# Patient Record
Sex: Female | Born: 1935 | Race: White | Hispanic: No | Marital: Married | Smoking: Never smoker
Health system: Southern US, Community
[De-identification: ages and names within clinical notes are randomized; demographics above are authoritative.]

## PROBLEM LIST (undated history)

## (undated) DIAGNOSIS — E041 Nontoxic single thyroid nodule: Secondary | ICD-10-CM

## (undated) DIAGNOSIS — M4800 Spinal stenosis, site unspecified: Secondary | ICD-10-CM

## (undated) DIAGNOSIS — E782 Mixed hyperlipidemia: Secondary | ICD-10-CM

## (undated) DIAGNOSIS — E049 Nontoxic goiter, unspecified: Secondary | ICD-10-CM

## (undated) DIAGNOSIS — I442 Atrioventricular block, complete: Secondary | ICD-10-CM

## (undated) DIAGNOSIS — I10 Essential (primary) hypertension: Secondary | ICD-10-CM

## (undated) HISTORY — DX: Mixed hyperlipidemia: E78.2

## (undated) HISTORY — DX: Nontoxic goiter, unspecified: E04.9

## (undated) HISTORY — PX: TONSILLECTOMY: SUR1361

## (undated) HISTORY — PX: DILATION AND CURETTAGE OF UTERUS: SHX78

## (undated) HISTORY — DX: Spinal stenosis, site unspecified: M48.00

## (undated) HISTORY — PX: BREAST BIOPSY: SHX20

## (undated) HISTORY — DX: Essential (primary) hypertension: I10

## (undated) SURGERY — EGD (ESOPHAGOGASTRODUODENOSCOPY)
Anesthesia: Moderate Sedation

---

## 1984-03-24 HISTORY — PX: KNEE ARTHROSCOPY: SHX127

## 1997-05-25 ENCOUNTER — Other Ambulatory Visit: Admission: RE | Admit: 1997-05-25 | Discharge: 1997-05-25 | Payer: Self-pay | Admitting: Obstetrics and Gynecology

## 1997-07-07 ENCOUNTER — Ambulatory Visit (HOSPITAL_BASED_OUTPATIENT_CLINIC_OR_DEPARTMENT_OTHER): Admission: RE | Admit: 1997-07-07 | Discharge: 1997-07-07 | Payer: Self-pay | Admitting: *Deleted

## 1999-03-25 HISTORY — PX: CATARACT EXTRACTION W/ INTRAOCULAR LENS  IMPLANT, BILATERAL: SHX1307

## 2002-05-12 ENCOUNTER — Ambulatory Visit (HOSPITAL_COMMUNITY): Admission: RE | Admit: 2002-05-12 | Discharge: 2002-05-12 | Payer: Self-pay | Admitting: Pulmonary Disease

## 2006-11-23 HISTORY — PX: BIOPSY THYROID: PRO38

## 2006-11-26 ENCOUNTER — Ambulatory Visit (HOSPITAL_COMMUNITY): Admission: RE | Admit: 2006-11-26 | Discharge: 2006-11-26 | Payer: Self-pay | Admitting: Pulmonary Disease

## 2006-12-08 ENCOUNTER — Ambulatory Visit (HOSPITAL_COMMUNITY): Admission: RE | Admit: 2006-12-08 | Discharge: 2006-12-08 | Payer: Self-pay | Admitting: Pulmonary Disease

## 2006-12-08 ENCOUNTER — Encounter (INDEPENDENT_AMBULATORY_CARE_PROVIDER_SITE_OTHER): Payer: Self-pay | Admitting: Diagnostic Radiology

## 2008-01-21 ENCOUNTER — Ambulatory Visit (HOSPITAL_COMMUNITY): Admission: RE | Admit: 2008-01-21 | Discharge: 2008-01-21 | Payer: Self-pay | Admitting: Pulmonary Disease

## 2008-01-31 ENCOUNTER — Ambulatory Visit (HOSPITAL_COMMUNITY): Admission: RE | Admit: 2008-01-31 | Discharge: 2008-01-31 | Payer: Self-pay | Admitting: Pulmonary Disease

## 2010-08-06 NOTE — H&P (Signed)
NAMEJACLYN, ANDY               ACCOUNT NO.:  [id number]   MEDICAL RECORD NO.:  [id number]          PATIENT TYPE:  OUT   LOCATION:  RAD                           FACILITY:  [facility]   PHYSICIAN:  Edward L. Juanetta Gosling, M.D.DATE OF BIRTH:  June 28, 1925   DATE OF ADMISSION:  11/26/2006  DATE OF DISCHARGE:  09/04/2008LH                              HISTORY & PHYSICAL   The history of physical is for a needle biopsy of the thyroid.  Ms.  Laitinen is a 77 year old who underwent [facility] screening which showed  that she had high questionable problems with her the carotid artery.  I  then had her do a carotid study as followup, and on that study her  carotids looked okay but she was found to have thyroid nodules  bilaterally.  Ultrasound of the thyroid showed that she had bilateral  nodules, and she is being brought in for a biopsy.  Otherwise, she has  been pretty healthy.  She has a history of hypertension and of  hyperlipidemia.  She is currently taking Cardizem CD 240.  She also  takes HCTZ 12.5 mg daily.   PAST MEDICAL HISTORY:  Otherwise is positive for breast biopsy.   FAMILY HISTORY:  Her father died in his 76s of a stroke, her mother in  her 50s of breast cancer.   SOCIAL HISTORY:  She lives at home.  She is a nonsmoker.   PHYSICAL EXAMINATION:  GENERAL:  Shows a well-developed, well-nourished  female who is in no acute distress.  VITAL SIGNS:  Blood pressure 120/64, pulse 56, respirations 8.  NECK:  She does have this probably a small nodule on her thyroid which I  do not feel prominently.  CHEST:  Clear.  HEART:  Regular.  ABDOMEN: Soft.  EXTREMITIES: No edema.   ASSESSMENT:  She has abnormal thyroid sonogram.   PLAN:  She is being set up for thyroid biopsy.      Edward L. Juanetta Gosling, M.D.  Electronically Signed     ELH/MEDQ  D:  12/03/2006  T:  12/04/2006  Job:  [id number]

## 2010-09-22 HISTORY — PX: TOTAL KNEE ARTHROPLASTY: SHX125

## 2010-10-10 ENCOUNTER — Other Ambulatory Visit: Payer: Self-pay | Admitting: Orthopedic Surgery

## 2010-10-10 ENCOUNTER — Other Ambulatory Visit (HOSPITAL_COMMUNITY): Payer: Self-pay | Admitting: Orthopedic Surgery

## 2010-10-10 ENCOUNTER — Ambulatory Visit (HOSPITAL_COMMUNITY)
Admission: RE | Admit: 2010-10-10 | Discharge: 2010-10-10 | Disposition: A | Discharge status: Home / Self Care | Payer: Medicare Other | Source: Ambulatory Visit | Attending: Orthopedic Surgery | Admitting: Orthopedic Surgery

## 2010-10-10 ENCOUNTER — Encounter (HOSPITAL_COMMUNITY): Payer: Medicare Other

## 2010-10-10 DIAGNOSIS — M1710 Unilateral primary osteoarthritis, unspecified knee: Secondary | ICD-10-CM | POA: Insufficient documentation

## 2010-10-10 DIAGNOSIS — Z01812 Encounter for preprocedural laboratory examination: Secondary | ICD-10-CM | POA: Insufficient documentation

## 2010-10-10 DIAGNOSIS — Z01810 Encounter for preprocedural cardiovascular examination: Secondary | ICD-10-CM | POA: Insufficient documentation

## 2010-10-10 DIAGNOSIS — R9431 Abnormal electrocardiogram [ECG] [EKG]: Secondary | ICD-10-CM | POA: Insufficient documentation

## 2010-10-10 DIAGNOSIS — R059 Cough, unspecified: Secondary | ICD-10-CM | POA: Insufficient documentation

## 2010-10-10 DIAGNOSIS — R05 Cough: Secondary | ICD-10-CM | POA: Insufficient documentation

## 2010-10-10 DIAGNOSIS — Z01818 Encounter for other preprocedural examination: Secondary | ICD-10-CM

## 2010-10-10 LAB — URINALYSIS, ROUTINE W REFLEX MICROSCOPIC
Glucose, UA: NEGATIVE mg/dL
Leukocytes, UA: NEGATIVE
Protein, ur: NEGATIVE mg/dL
Specific Gravity, Urine: 1.012 (ref 1.005–1.030)
Urobilinogen, UA: 0.2 mg/dL (ref 0.0–1.0)

## 2010-10-10 LAB — DIFFERENTIAL
Basophils Relative: 0 % (ref 0–1)
Eosinophils Absolute: 0.2 10*3/uL (ref 0.0–0.7)
Lymphs Abs: 1.2 10*3/uL (ref 0.7–4.0)
Neutro Abs: 5.1 10*3/uL (ref 1.7–7.7)
Neutrophils Relative %: 73 % (ref 43–77)

## 2010-10-10 LAB — CBC
MCH: 30.3 pg (ref 26.0–34.0)
Platelets: 252 10*3/uL (ref 150–400)
RBC: 4.16 MIL/uL (ref 3.87–5.11)
WBC: 7 10*3/uL (ref 4.0–10.5)

## 2010-10-10 LAB — COMPREHENSIVE METABOLIC PANEL
ALT: 17 U/L (ref 0–35)
Alkaline Phosphatase: 63 U/L (ref 39–117)
CO2: 30 mEq/L (ref 19–32)
Calcium: 10.7 mg/dL — ABNORMAL HIGH (ref 8.4–10.5)
GFR calc Af Amer: >60 mL/min (ref >60)
GFR calc non Af Amer: >60 mL/min (ref >60)
Glucose, Bld: 116 mg/dL — ABNORMAL HIGH (ref 70–99)
Sodium: 139 mEq/L (ref 135–145)

## 2010-10-10 LAB — SURGICAL PCR SCREEN
MRSA, PCR: NEGATIVE
Staphylococcus aureus: NEGATIVE

## 2010-10-10 LAB — PROTIME-INR: Prothrombin Time: 13.5 seconds (ref 11.6–15.2)

## 2010-10-17 ENCOUNTER — Inpatient Hospital Stay (HOSPITAL_COMMUNITY)
Admission: RE | Admit: 2010-10-17 | Discharge: 2010-10-20 | DRG: 470 | Disposition: A | Discharge status: Home Health | Payer: Medicare Other | Source: Ambulatory Visit | Attending: Orthopedic Surgery | Admitting: Orthopedic Surgery

## 2010-10-17 DIAGNOSIS — D62 Acute posthemorrhagic anemia: Secondary | ICD-10-CM | POA: Diagnosis not present

## 2010-10-17 DIAGNOSIS — R110 Nausea: Secondary | ICD-10-CM | POA: Diagnosis not present

## 2010-10-17 DIAGNOSIS — I498 Other specified cardiac arrhythmias: Secondary | ICD-10-CM | POA: Diagnosis not present

## 2010-10-17 DIAGNOSIS — M24569 Contracture, unspecified knee: Secondary | ICD-10-CM | POA: Diagnosis present

## 2010-10-17 DIAGNOSIS — Z01812 Encounter for preprocedural laboratory examination: Secondary | ICD-10-CM

## 2010-10-17 DIAGNOSIS — M1710 Unilateral primary osteoarthritis, unspecified knee: Principal | ICD-10-CM | POA: Diagnosis present

## 2010-10-17 DIAGNOSIS — I10 Essential (primary) hypertension: Secondary | ICD-10-CM | POA: Diagnosis present

## 2010-10-17 LAB — TYPE AND SCREEN: Antibody Screen: NEGATIVE

## 2010-10-18 LAB — CBC
HCT: 26.6 % — ABNORMAL LOW (ref 36.0–46.0)
Hemoglobin: 9.7 g/dL — ABNORMAL LOW (ref 12.0–15.0)
MCH: 32 pg (ref 26.0–34.0)
MCHC: 36.5 g/dL — ABNORMAL HIGH (ref 30.0–36.0)
MCV: 87.8 fL (ref 78.0–100.0)

## 2010-10-18 LAB — BASIC METABOLIC PANEL
Calcium: 8.8 mg/dL (ref 8.4–10.5)
GFR calc non Af Amer: >60 mL/min (ref >60)
Sodium: 133 mEq/L — ABNORMAL LOW (ref 135–145)

## 2010-10-19 LAB — BASIC METABOLIC PANEL
CO2: 29 mEq/L (ref 19–32)
Chloride: 101 mEq/L (ref 96–112)
Sodium: 135 mEq/L (ref 135–145)

## 2010-10-19 LAB — CBC
Platelets: 211 10*3/uL (ref 150–400)
RBC: 2.82 MIL/uL — ABNORMAL LOW (ref 3.87–5.11)
WBC: 8.8 10*3/uL (ref 4.0–10.5)

## 2010-10-20 LAB — CBC
HCT: 22.2 % — ABNORMAL LOW (ref 36.0–46.0)
MCV: 86.7 fL (ref 78.0–100.0)
Platelets: 187 10*3/uL (ref 150–400)
RBC: 2.56 MIL/uL — ABNORMAL LOW (ref 3.87–5.11)
WBC: 9 10*3/uL (ref 4.0–10.5)

## 2010-10-21 ENCOUNTER — Other Ambulatory Visit (HOSPITAL_COMMUNITY): Payer: Self-pay | Admitting: Radiation Oncology

## 2010-10-31 NOTE — Op Note (Signed)
Laura Martin, Laura Martin               ACCOUNT NO.:  [id number]  MEDICAL RECORD NO.:  [id number]  LOCATION:  0003                         FACILITY:  [facility]  PHYSICIAN:  Essynce Munsch L. Rendall, M.D.  DATE OF BIRTH:  1925-09-17  DATE OF PROCEDURE:  10/17/2010 DATE OF DISCHARGE:                              OPERATIVE REPORT   PREOPERATIVE DIAGNOSIS:  Osteoarthritis, left knee with contractures.  SURGICAL PROCEDURES:  Left LCS total knee arthroplasty with computer navigation assistance.  POSTOPERATIVE DIAGNOSIS:  Osteoarthritis, left knee with contractures.  SURGEON:  Radonna Bracher L. Rendall, M.D.  ASSISTANT:  Legrand Pitts. Duffy, P.A.  ANESTHESIA:  Spinal with femoral nerve block.  PATHOLOGY:  The patient has medial compartment bone against bone and advanced arthritis throughout the knee.  She has a fixed flexion deformity with 15 degrees fixed flexion contracture and 7 degrees of varus.  She has reached the point where her knee hurts virtually all the time and hinders ambulation.  PROCEDURE IN DETAIL:  Under spinal anesthesia with femoral nerve block, the left leg was prepared with DuraPrep and draped as a sterile field. The sterile tourniquet was applied proximally.  Legs wrapped out with the Esmarch and the tourniquet was used at 350 mm.  Standard time-out was done, antibiotics were given, patient's knee was opened with midline incision, the patella was everted, femur was sized at a standard plus. The spurs are debrided, the knee is further debrided, and preparation for computer mapping.  Schanz pins were then placed in the superior medial tibia and the distal femur.  The arrays were set up, the femoral head was identified, malleoli are identified.  Proximal tibia and distal femur are mapped to within 0.5 mm of reproducible accuracy.  The proximal tibial cut was then made within 1 degree of anatomic alignment and preparing for the balancer.  It is necessary to do release of the superficial MCL,  going to the posterior capsule, taking down large spurs along the medial side of the tibia.  Once this was done, the balancer was got the longitudinal alignment within 1 degree and the flexion gap was then measured.  The first femoral cuts were then made, anterior and posterior flare of the distal femur within 1 degree of rotational alignment, flexion gap 10 mm.  Distal femoral cut was then made and extension gap within 10 mm.  The posterior recesses, menisci, and cruciates were then debrided.  Spurs taken off the back of the femoral condyles.  Recessing guide is then used.  Attention was then turned to the tibia, it is sized to the same #3 center peg hole with keel was inserted.  Trial of the 10 bearing and standard plus femur reveal excellent fit, alignment, and stability.  The patella was osteotomized and the 3-hole patella inserted.  With these components, the longitudinal alignment was within 1 degree, knee extension was within 2 degrees.  The knee was then prepared with pulse irrigation.  Permanent components obtained and cemented in place with SmartSet HV bone cement. Once the cement has hardened, the tourniquet was let down at 62 minutes. Multiple small vessels were cauterized.  A medium Hemovac drain was inserted.  The knee was then closed  in layers with #1 Ticron, #1 Vicryl, 2-0 Vicryl, and skin clips.  Operative time approximately an hour and 20 minutes.  The patient tolerated the procedure well and returned to recovery in good condition.     Dimitris Shanahan L. Priscille Kluver, M.D.     Renato Gails  D:  10/17/2010  T:  10/17/2010  Job:  [id number]  Electronically Signed by Erasmo Leventhal M.D. on 10/31/2010 03:05:33 PM

## 2010-11-07 NOTE — Discharge Summary (Signed)
Laura Martin, OATS               ACCOUNT NO.:  [id number]  MEDICAL RECORD NO.:  [id number]  LOCATION:  1613                         FACILITY:  [facility]  PHYSICIAN:  Elektra Wartman L. Rendall, M.D.  DATE OF BIRTH:  [birth year]  DATE OF ADMISSION:  10/17/2010 DATE OF DISCHARGE:  10/20/2010                              DISCHARGE SUMMARY   ADMISSION DIAGNOSES: 1. End-stage osteoarthritis, bilateral knees, left worse than right. 2. Cyst on her left side of her back. 3. Hypertension. 4. Hypercholesterolemia. 5. Degenerative disk disease, lumbar spine.  DISCHARGE DIAGNOSES: 1. End-stage osteoarthritis, bilateral knees, now status post left     total knee arthroplasty. 2. Acute blood loss anemia secondary to surgery. 3. Bradycardia, now resolved. 4. Cyst on the left side of her back. 5. Hypertension. 6. Hypercholesterolemia. 7. Degenerative disk disease, lumbar spine.  SURGICAL PROCEDURES:  On October 17, 2010, Ms. [place] underwent a left total knee arthroplasty with computer navigation by Dr. Jonny Ruiz L. Rendall, assisted by Arnoldo Morale, PA-C.  She had a DePuy LCS complete metal back patella placed, cemented, size standard plus, with primary femoral component, cemented, size standard plus, left.  A tibial tray rotating platform MBT keel, size 3, cemented, with an LCS complete RP insert rotating platform, 10 mm thickness, standard plus.  COMPLICATIONS:  None.  CONSULTS: 1. Physical therapy consult, July 27th. 2. Occupational therapy consult, October 19, 2010.  HISTORY OF PRESENT ILLNESS:  This 77 year old white female patient presented to Dr. Priscille Kluver with a 30 plus year history of gradual-onset progressive left knee pain.  She has a history of a knee scope in 1986 and no other injury.  The left knee pain is now intermittent over the anteromedial knee without radiation.  It increases with bending and stairs and decreases with Aleve.  She has no mechanical symptoms and some Aleve does help her  pain, but she has failed conservative treatment and x-rays show end-stage arthritic changes.  Because of this, she is presenting for a left knee replacement.  HOSPITAL COURSE:  She tolerated her surgical procedure well, however, she did have some bradycardia.  That was monitored closely.  On postop day #1, she was afebrile, pulse was low at 42-62.  Hemoglobin was 9.7, hematocrit 26.6.  Her Hemovac was discontinued from her knees.  She was tolerating the CPM well.  Her oxygen was weaned that day.  Her meds were adjusted.  An EKG was obtained to rule out any cardiac abnormality and that was normal.  She was started on therapy per protocol.  On postop day #2, pain was improving.  She did have some nausea, which was treated effectively with Zofran.  She remained afebrile.  Hemoglobin 8.8, hematocrit 25.  Hemoglobin and hematocrit were monitored and she was continued on therapy.  On July 29th, she remained afebrile, but hemoglobin had dropped to 8.1 with hematocrit of 22.2, she was asymptomatic.  She did well enough with therapy that was felt she was ready for discharge home and was discharged home later that day.  DISCHARGE INSTRUCTIONS:  DIET:  She is to resume her regular prehospitalization diet.  MEDICATIONS:  Please see the patient med discharge instruction sheet for complete documentation of  her medications.  We did place her on Dilaudid, Robaxin, and Xarelto to have at home and we did have her stop taking her Aleve, which she was on preoperatively.  Again, complete documentation of her medications and dosages are on the patient med discharge instruction sheet.  ACTIVITY:  She can be out of bed, weightbearing as tolerated on the left leg with use of the walker.  No lifting or driving for 6 weeks.  Please see the white total joint discharge sheet for further activity instructions.  WOUND CARE:  Please see the white total joint discharge sheet for further wound care  instructions.  FOLLOWUP:  She is to follow up with Dr. Priscille Kluver in our office on Monday, August 6th, and needs to call [phone] for that appointment.  She has arranged for home health PT per [facility] home [facility].  LABORATORY DATA:  Hemoglobin/hematocrit ranged from 9.7 and 26.6 on the 27th to 8.1 and 22.2 on the 29th.  Platelets went from 231 on the 27th to 187 on the 29th.  Sodium went from 133 on the 27th to 135 on the 28th.  Glucose went from 170 on the 27th to 120 on the 28th.  All other laboratory studies were within normal limits.     Legrand Pitts Duffy, P.A.   ______________________________ Carlisle Beers. Priscille Kluver, M.D.    KED/MEDQ  D:  10/31/2010  T:  11/01/2010  Job:  [id number]  Electronically Signed by Otilio Jefferson. on 11/01/2010 08:17:36 AM Electronically Signed by Erasmo Leventhal M.D. on 11/07/2010 10:25:41 AM

## 2010-11-19 ENCOUNTER — Ambulatory Visit (HOSPITAL_COMMUNITY)
Admission: RE | Admit: 2010-11-19 | Discharge: 2010-11-19 | Disposition: A | Discharge status: Home / Self Care | Payer: Medicare Other | Source: Ambulatory Visit | Attending: Orthopedic Surgery | Admitting: Orthopedic Surgery

## 2010-11-19 DIAGNOSIS — M25562 Pain in left knee: Secondary | ICD-10-CM | POA: Insufficient documentation

## 2010-11-19 DIAGNOSIS — M25669 Stiffness of unspecified knee, not elsewhere classified: Secondary | ICD-10-CM | POA: Insufficient documentation

## 2010-11-19 DIAGNOSIS — IMO0001 Reserved for inherently not codable concepts without codable children: Secondary | ICD-10-CM | POA: Insufficient documentation

## 2010-11-19 DIAGNOSIS — R262 Difficulty in walking, not elsewhere classified: Secondary | ICD-10-CM | POA: Insufficient documentation

## 2010-11-19 DIAGNOSIS — M25569 Pain in unspecified knee: Secondary | ICD-10-CM | POA: Insufficient documentation

## 2010-11-19 DIAGNOSIS — M6281 Muscle weakness (generalized): Secondary | ICD-10-CM | POA: Insufficient documentation

## 2010-11-19 DIAGNOSIS — R29898 Other symptoms and signs involving the musculoskeletal system: Secondary | ICD-10-CM | POA: Insufficient documentation

## 2010-11-19 NOTE — Patient Instructions (Addendum)
HEP

## 2010-11-19 NOTE — Progress Notes (Signed)
Physical Therapy Evaluation  Patient Details  Name: Laura Martin MRN: [id number] Date of Birth: [birth year]  Today's Date: 11/19/2010 Time: 6962-9528 Time Calculation (min): 55 min Visit#: 1 of12 Re-eval: 12/17/10  Past Medical History: No past medical history on file. Past Surgical History: No past surgical history on file.  Subjective Symptoms/Limitations Symptoms: Pt states that she had on the July 26th.  She was in the hospital for three days.  Her home heralth therapy ended August 24th.  The patient is currently walkng with a cane.  The patient states that right before the operation she started wallking with a cane do to pain but she hopes that she will not have to use the cane  as time progresses.  The pateint states tht she is having the hardest time sleeping at night.  The patient states that it takes a couple of hours to get to sleep and then she will wake up several times a night .  The patient is now being referred to out-patient therapy to improve her functional  ability and quality of life. How long can you sit comfortably?: Able to sti for several hours but will become very fidgety after thirty minute. How long can you stand comfortably?: Standing is difficult.  She is able to stand for less than ten minutes. How long can you walk comfortably?: Pt has walked with her cane for fifteen minutes without stopping Pain Assessment Currently in Pain?: Yes Pain Score: 0-No pain (greatest pain has been a 3) Pain Location: Knee Pain Orientation: Left Pain Type: Surgical pain Multiple Pain Sites: No  Precautions/Restrictions   none  Prior Functioning  Home Living Type of Home: House Lives With: Spouse Receives Help From: Family Home Layout: Able to live on main level with bedroom/bathroom Home Access: Stairs to enter Entrance Stairs-Rails: Right Entrance Stairs-Number of Steps: 5 Bathroom Shower/Tub: Tub/shower unit (has a seat.) Firefighter: Standard Home Adaptive  Equipment: Tub transfer bench Prior Function Level of Independence: Independent with basic ADLs Driving: Yes Able to Take Stairs Reciprically: No Vocation: Retired Leisure: Hobbies-yes (Comment) Comments: enjoys dancing  Cognition Cognition Overall Cognitive Status: Appears within functional limits for tasks assessed Arousal/Alertness: Awake/alert Orientation Level: Oriented X4  Sensation/Coordination/Flexibility Sensation Light Touch:  (Scar is sensitive at inferior aspect.)  Assessment LLE AROM (degrees) Left Knee Extension 0-130: 7  Left Knee Flexion 0-140: 105  LLE PROM (degrees) Left Knee Extension 0-130: 4 Left Knee Flexion 0-140: 112 LLE Strength Left Hip Flexion: 5/5 Left Hip Extension: 3+/5 Left Hip ABduction: 4/5 Left Hip ADduction: 5/5 Left Knee Flexion: 3+/5 Left Knee Extension: 5/5  Mobility (including Balance)  Pt ambulates with a cane.    Exercise/Treatments For stretching and strengthening per doc flow sheet.    Physical Therapy Assessment and Plan PT Assessment and Plan Clinical Impression Statement: Pt with weakened hip abductors, hip extensors and hamstrings as well as decreased ROM affecting ability to walk and comfort of sitting/sleeping.  Pt will benefit from skilled physical therapy to improve I and  quality of life. Rehab Potential: Good PT Frequency: Min 3X/week PT Duration: 4 weeks PT Treatment/Interventions: Therapeutic exercise PT Plan: work on balance, gt without an assistive device and strengthening hip ab,extensors and hamstring mm.  Begin bike, standing ham curl/hip abduction,extension, rockerboard, SLS and terminal knee extesion next treatment.    Goals PT Short Term Goals PT Short Term Goal 1: Pt to be able to sleep 4-5 hours PT Short Term Goal 2: Pt ambulating in the house without  an assistive device. PT Short Term Goal 3: ROM o to 115 to allow more comfort in sitting. PT Long Term Goals PT Long Term Goal 1: Pt to ambulate  outside without an assistve device.- 4wk- PT Long Term Goal 2: Pt to start dancing again.l4 wk Long Term Goal 3: strength wnl-4wk Long Term Goal 4: Pt sleeping throughout the night- 4 wk  Problem List Patient Active Problem List  Diagnoses  . Stiffness of joint, not elsewhere classified, lower leg  . Pain in left knee  . Weakness of left leg    PT - End of Session Activity Tolerance: Patient tolerated treatment well General Behavior During Session: [facility] for tasks performed Cognition: [facility] for tasks performed   RUSSELL,CINDY 11/19/2010, 10:54 AM  Physician Documentation Your signature is required to indicate approval of the treatment plan as stated above.  Please sign and either send electronically or make a copy of this report for your files and return this physician signed original.   Please mark one 1.__approve of plan  2. ___approve of plan with the following conditions.   ______________________________                                                          _____________________ Physician Signature                                                                                                             Date

## 2010-11-21 ENCOUNTER — Ambulatory Visit (HOSPITAL_COMMUNITY)
Admission: RE | Admit: 2010-11-21 | Discharge: 2010-11-21 | Disposition: A | Discharge status: Home / Self Care | Payer: Medicare Other | Source: Ambulatory Visit | Attending: Physical Therapy | Admitting: Physical Therapy

## 2010-11-21 DIAGNOSIS — M25669 Stiffness of unspecified knee, not elsewhere classified: Secondary | ICD-10-CM

## 2010-11-21 DIAGNOSIS — M25562 Pain in left knee: Secondary | ICD-10-CM

## 2010-11-21 DIAGNOSIS — R29898 Other symptoms and signs involving the musculoskeletal system: Secondary | ICD-10-CM

## 2010-11-21 NOTE — Patient Instructions (Signed)
Not to raise LE so high with prone hip ext

## 2010-11-21 NOTE — Progress Notes (Signed)
Physical Therapy Treatment Patient Details  Name: Laura Martin MRN: [id number] Date of Birth: 1925/12/14  Today's Date: 11/21/2010 Time: 5284-1324 Time Calculation (min): 50 min Visit#: 1 of 12 Re-eval: 12/17/10  Subjective: Symptoms/Limitations Symptoms: My bottom muscles are very sore but my knee is feeling good. Pain Assessment Currently in Pain?: Yes Pain Score:   2 Pain Location: Other (Comment) (glut area) Pain Orientation: Left;Right  Precautions/Restrictions     Mobility (including Balance)       Exercise/Treatments  for strengthening and stretching per doc flow sheet.    Physical Therapy Assessment and Plan PT Assessment and Plan Clinical Impression Statement: Pt completed new exercises with good technique. Rehab Potential: Good PT Plan: Begin chair lunging to increase flex, prone contract relax next visit.    Goals  normalize gait; return pt to prior level of functioning.  Problem List Patient Active Problem List  Diagnoses  . Stiffness of joint, not elsewhere classified, lower leg  . Pain in left knee  . Weakness of left leg    PT - End of Session Activity Tolerance: Patient tolerated treatment well General Behavior During Session: [facility] for tasks performed Cognition: [facility] for tasks performed  RUSSELL,CINDY 11/21/2010, 4:10 PM

## 2010-11-27 ENCOUNTER — Ambulatory Visit (HOSPITAL_COMMUNITY)
Admission: RE | Admit: 2010-11-27 | Discharge: 2010-11-27 | Disposition: A | Discharge status: Home / Self Care | Payer: Medicare Other | Source: Ambulatory Visit | Attending: Orthopedic Surgery | Admitting: Orthopedic Surgery

## 2010-11-27 DIAGNOSIS — M6281 Muscle weakness (generalized): Secondary | ICD-10-CM | POA: Insufficient documentation

## 2010-11-27 DIAGNOSIS — M25569 Pain in unspecified knee: Secondary | ICD-10-CM | POA: Insufficient documentation

## 2010-11-27 DIAGNOSIS — R262 Difficulty in walking, not elsewhere classified: Secondary | ICD-10-CM | POA: Insufficient documentation

## 2010-11-27 DIAGNOSIS — M25669 Stiffness of unspecified knee, not elsewhere classified: Secondary | ICD-10-CM | POA: Insufficient documentation

## 2010-11-27 DIAGNOSIS — IMO0001 Reserved for inherently not codable concepts without codable children: Secondary | ICD-10-CM | POA: Insufficient documentation

## 2010-11-27 NOTE — Progress Notes (Signed)
Physical Therapy Treatment Patient Details  Name: CYNDIE WOODBECK MRN: [id number] Date of Birth: [birth year]  Today's Date: 11/27/2010 Time: 4098-1191 Time Calculation (min): 75 min Visit#: 3 of 12 Re-eval: 12/17/10 Charges: Therex x 45' Manual therapy x 8' Ice x 1 unit  Subjective: Symptoms/Limitations Symptoms: No pain right now. I took a pain pill before I came. I felt great after last visit. It only hurts when I do certain things like go to bend it. Pain Assessment Currently in Pain?: No/denies   Exercise/Treatments Stretches   Aerobic Rec bike x 6'@2 .0 Standing Rocker board x 1' L knee flexion x 15 Functional squat x 10 Heel raises x 15 Seated LAQ x 15 Supine Heel slide x 15 TKE x 15 Bridge x 10 Sidelying Hip abd x10 Hip add x 10 Prone  Knee flexion x 10 Hip ext x 10    Physical Therapy Assessment and Plan PT Assessment and Plan Clinical Impression Statement: Pt completes therex without difficulty. Began standing chair lunge and prone contract relax to increase flexion. Began manual techniques to decrease scar tissue at proximal scar. PT Treatment/Interventions: Therapeutic exercise;Other (comment) (Manual therapy; Ice) PT Plan: Continue to progress.Begin lateral step up and forward step up next tx.     Problem List Patient Active Problem List  Diagnoses  . Stiffness of joint, not elsewhere classified, lower leg  . Pain in left knee  . Weakness of left leg    PT - End of Session Activity Tolerance: Patient tolerated treatment well General Behavior During Session: [facility] for tasks performed Cognition: [facility] for tasks performed  Antonieta Iba 11/27/2010, 12:12 PM

## 2010-11-29 ENCOUNTER — Ambulatory Visit (HOSPITAL_COMMUNITY)
Admission: RE | Admit: 2010-11-29 | Discharge: 2010-11-29 | Disposition: A | Discharge status: Home / Self Care | Payer: Medicare Other | Source: Ambulatory Visit | Attending: Pulmonary Disease | Admitting: Pulmonary Disease

## 2010-11-29 DIAGNOSIS — R29898 Other symptoms and signs involving the musculoskeletal system: Secondary | ICD-10-CM

## 2010-11-29 DIAGNOSIS — M25669 Stiffness of unspecified knee, not elsewhere classified: Secondary | ICD-10-CM

## 2010-11-29 DIAGNOSIS — M25562 Pain in left knee: Secondary | ICD-10-CM

## 2010-11-29 NOTE — Progress Notes (Signed)
Physical Therapy Treatment Patient Details  Name: DAWNIELLE CHRISTIANA MRN: [id number] Date of Birth: November 13, 1925  Today's Date: 11/29/2010 Time: 1530-1610 Time Calculation (min): 40 min Visit#: 4 of12 Re-eval: 12/02/10  Subjective: Symptoms/Limitations Symptoms: I have no pain.  Precautions/Restrictions     Mobility (including Balance)     ambulating without assistive device.  Exercise/Treatments Stretches and strengthening per doc flow shet    Physical Therapy Assessment and Plan PT Assessment and Plan Clinical Impression Statement: Pt doing well with ROM and strengthening.  Pt returns to MD on 9/10 in PM Rehab Potential: Good PT Plan: reassess next treatment for MD visit.    Goals  ROM 0-120 I with all activities  Problem List Patient Active Problem List  Diagnoses  . Stiffness of joint, not elsewhere classified, lower leg  . Pain in left knee  . Weakness of left leg    PT - End of Session Activity Tolerance: Patient tolerated treatment well General Behavior During Session: [facility] for tasks performed Cognition: [facility] for tasks performed  RUSSELL,CINDY 11/29/2010, 4:12 PM

## 2010-12-02 ENCOUNTER — Ambulatory Visit (HOSPITAL_COMMUNITY)
Admission: RE | Admit: 2010-12-02 | Discharge: 2010-12-02 | Disposition: A | Discharge status: Home / Self Care | Payer: Medicare Other | Source: Ambulatory Visit | Attending: Physical Therapy | Admitting: Physical Therapy

## 2010-12-02 DIAGNOSIS — M25562 Pain in left knee: Secondary | ICD-10-CM

## 2010-12-02 DIAGNOSIS — R29898 Other symptoms and signs involving the musculoskeletal system: Secondary | ICD-10-CM

## 2010-12-02 DIAGNOSIS — M25669 Stiffness of unspecified knee, not elsewhere classified: Secondary | ICD-10-CM

## 2010-12-02 NOTE — Progress Notes (Signed)
Physical Therapy Evaluation  Patient Details  Name: TAEGAN HAIDER MRN: [id number] Date of Birth: 07/28/1925  Today's Date: 12/02/2010 Time: 7829-5621 Time Calculation (min): 54 min Visit#: 5 of12 Re-eval: 12/19/10  Past Medical History: No past medical history on file. Past Surgical History: No past surgical history on file.  Subjective Symptoms/Limitations Symptoms: Pt states that her pain is minimal.  States she is actually having more difficulty with her left knee than her right. Pain Assessment Currently in Pain?: No/denies  Precautions/Restrictions   none  Prior Functioning   I Cognition   Alert and oriented Sensation/Coordination/Flexibility    Assessment LLE AROM (degrees) Left Knee Extension 0-130: 2  Was lacking 7 Left Knee Flexion 0-140: 115  Was 107 LLE PROM (degrees) Left Knee Extension 0-130: 0 Left Knee Flexion 0-140: 120 LLE Strength Left Hip Flexion: 5/5 Left Hip Extension: 3+/5 was 3+ Left Hip ABduction: 4/5 was 4/5 Left Knee Flexion: 3+/5 was 3+  Mobility (including Balance)   ambulating without her cane in the house.    Exercise/Treatments  for strengthening and stretching.  Physical Therapy Assessment and Plan PT Assessment and Plan Clinical Impression Statement: Pt progressing well still has strength deficits in hip ext and abduction as well as knee flexion. Rehab Potential: Good PT Treatment/Interventions: Gait training;Therapeutic exercise PT Plan: continue for 2 more weeks per prescription.    Goals PT Short Term Goals PT Short Term Goal 1: 3-4 hrs. Goal to sleep 4-5 hours. PT Short Term Goal 2 - Progress: Met PT Short Term Goal 3 - Progress: Met PT Long Term Goals PT Long Term Goal 1 - Progress: Progressing toward goal- goal to have normal strength. Long Term Goal 3 Progress: Progressing toward goal- goal ambulate without cane.  Problem List Patient Active Problem List  Diagnoses  . Stiffness of joint, not elsewhere  classified, lower leg  . Pain in left knee  . Weakness of left leg    PT - End of Session Activity Tolerance: Patient tolerated treatment well General Behavior During Session: [facility] for tasks performed Cognition: [facility] for tasks performed Plan:  Continue to see pt 3x wk for 2 weeks to fulfill original prescription.  RUSSELL,CINDY 12/02/2010, 11:51 AM  Physician Documentation Your signature is required to indicate approval of the treatment plan as stated above.  Please sign and either send electronically or make a copy of this report for your files and return this physician signed original.   Please mark one 1.__approve of plan  2. ___approve of plan with the following conditions.   ______________________________                                                          _____________________ Physician Signature  Date  

## 2010-12-04 ENCOUNTER — Ambulatory Visit (HOSPITAL_COMMUNITY)
Admission: RE | Admit: 2010-12-04 | Discharge: 2010-12-04 | Disposition: A | Discharge status: Home / Self Care | Payer: Medicare Other | Source: Ambulatory Visit | Attending: *Deleted | Admitting: *Deleted

## 2010-12-04 NOTE — Progress Notes (Signed)
Physical Therapy Treatment Patient Details  Name: CHANIECE BARBATO MRN: [id number] Date of Birth: 05/21/1925  Today's Date: 12/04/2010 Time: 4098-1191 Time Calculation (min): 60 min Visit#: 6 of 12 Re-eval: 12/19/10 Charges:  Therex x 40'  Subjective: Symptoms/Limitations Symptoms: Pt states that she had a bad night last and could not get comfortable to go to sleep but this morning she woke up without pain. Pain Assessment Currently in Pain?: No/denies   Exercise/Treatments Aerobic  Rec bike x 6'@2 .0  Standing  Rocker board x 2'  L knee flexion w/ 3# x 15  Functional squat x 10  Heel raises x 20  TKE w/blue band x 15 Hip ext x 15 Hip abd x 15 SLS w/ vectors L 5x10" Supine  SAQ w/3# x 15 Bridge x 15  Sidelying  Hip abd w/3# x15  Hip add w/3# x 10  Prone  Hip ext w/3# x 10 Quad stretch w/ rope 3x 30" Modalities Modalities: Cryotherapy Cryotherapy Number Minutes Cryotherapy: 10 Minutes Cryotherapy Location: Knee (Left) Type of Cryotherapy: Ice pack  Physical Therapy Assessment and Plan PT Assessment and Plan Clinical Impression Statement: Pt continues to display increases in ROM and strength. Began prone quad stretch to increase L knee flexion this tx. PT Treatment/Interventions: Therapeutic exercise;Other (comment) (Ice) PT Plan: Continue to progress strength and ROM per PT POC.     Problem List Patient Active Problem List  Diagnoses  . Stiffness of joint, not elsewhere classified, lower leg  . Pain in left knee  . Weakness of left leg    PT - End of Session Activity Tolerance: Patient tolerated treatment well General Behavior During Session: [facility] for tasks performed Cognition: [facility] for tasks performed  Antonieta Iba 12/04/2010, 11:52 AM

## 2010-12-06 ENCOUNTER — Ambulatory Visit (HOSPITAL_COMMUNITY)
Admission: RE | Admit: 2010-12-06 | Discharge: 2010-12-06 | Disposition: A | Discharge status: Home / Self Care | Payer: Medicare Other | Source: Ambulatory Visit | Attending: Physical Therapy | Admitting: Physical Therapy

## 2010-12-06 DIAGNOSIS — M25562 Pain in left knee: Secondary | ICD-10-CM

## 2010-12-06 DIAGNOSIS — M25669 Stiffness of unspecified knee, not elsewhere classified: Secondary | ICD-10-CM

## 2010-12-06 DIAGNOSIS — R29898 Other symptoms and signs involving the musculoskeletal system: Secondary | ICD-10-CM

## 2010-12-06 NOTE — Progress Notes (Signed)
Physical Therapy Treatment Patient Details  Name: Laura Martin MRN: [id number] Date of Birth: Sep 05, 1925  Today's Date: 12/06/2010 Time: 4098-1191 Time Calculation (min): 71 min Visit#: 7 of 12 Re-eval: 12/19/10  Charge: therex 49 min Manual 6 min Ice 10 min  Subjective: Symptoms/Limitations Symptoms: Feel good when I'm sitting, standing hurts my back, knee good today. Pain Assessment Currently in Pain?: No/denies  Precautions/Restrictions     Mobility (including Balance)       Exercise/Treatments  Aerobic  Rec bike x 6'@2 .0  Standing  Rocker board x 2' R/L L knee flexion w/ 3# x 15  Functional squat 2 sets x 10 3" holds Heel raises 2 x10  TKE w/blue band x 15  Hip ext x 15 3# Hip abd x 15 3# L SLS 20" SLS w/ vectors L 5x10"  Forward step up 4" 10x Lateral step 4" 10x Supine  SAQ w/3# x 15  Bridge x 15  Heel slide with quad sets incorporated 15x 5" PROM 3x 30" each Sidelying  Hip abd w/3# x15   Physical Therapy Assessment and Plan PT Assessment and Plan Clinical Impression Statement: Began fwd and lateral step up for strengthening with vc for proper tech.  Pt completed correclty following cues but did displayed weak eccentric quad control descending lateral step up.  ROM continues to improve. PT Plan: Continue progressing strength and ROM.    Goals    Problem List Patient Active Problem List  Diagnoses  . Stiffness of joint, not elsewhere classified, lower leg  . Pain in left knee  . Weakness of left leg    PT - End of Session Activity Tolerance: Patient tolerated treatment well General Behavior During Session: [facility] for tasks performed Cognition: [facility] for tasks performed  Juel Burrow 12/06/2010, 1:03 PM

## 2010-12-09 ENCOUNTER — Ambulatory Visit (HOSPITAL_COMMUNITY)
Admission: RE | Admit: 2010-12-09 | Discharge: 2010-12-09 | Disposition: A | Discharge status: Home / Self Care | Payer: Medicare Other | Source: Ambulatory Visit | Attending: *Deleted | Admitting: *Deleted

## 2010-12-09 NOTE — Progress Notes (Signed)
Physical Therapy Treatment Patient Details  Name: Laura Martin MRN: [id number] Date of Birth: [birth year]  Today's Date: 12/09/2010 Time: 1478-2956 Time Calculation (min): 55 min Visit#: 8 of 12 Re-eval: 12/19/10 Charges: Therex x 40' Ice x 10'  Subjective: Symptoms/Limitations Symptoms: I was sore all weekend from Friday's tx. No pain today though. Pain Assessment Currently in Pain?: No/denies   Exercise/Treatments  Standing  Rocker board x 2' R/L  L knee flexion w/ 3# x 15  Functional squat 2 sets x 10 3" holds  Hip ext x 15 3#  Hip abd x 15 3#  L SLS 20"  SLS w/ vectors L 5x10"  Forward step up 4" 10x  Lateral step 4" 10x  Supine  SAQ w/3# x 15  Bridge x 15  Heel slide with quad sets incorporated 10x 5"  PROM     Modalities Modalities: Cryotherapy Manual Therapy Manual Therapy: Other (comment) Cryotherapy Number Minutes Cryotherapy: 10 Minutes Cryotherapy Location: Knee (Left) Type of Cryotherapy: Ice pack  Physical Therapy Assessment and Plan PT Assessment and Plan Clinical Impression Statement: Pt with c/o slight dizziness after completing standing therex. Dizziness subsided with rest. No increases in therex secondary to increased pain after last tx. PT Treatment/Interventions: Therapeutic exercise;Other (comment) (Ice) PT Plan: Continue to progress ROM/strength per PT POC. Assess pain next tx.     Problem List Patient Active Problem List  Diagnoses  . Stiffness of joint, not elsewhere classified, lower leg  . Pain in left knee  . Weakness of left leg    PT - End of Session Activity Tolerance: Patient tolerated treatment well General Behavior During Session: [facility] for tasks performed Cognition: [facility] for tasks performed  Antonieta Iba 12/09/2010, 11:08 AM

## 2010-12-11 ENCOUNTER — Ambulatory Visit (HOSPITAL_COMMUNITY)
Admission: RE | Admit: 2010-12-11 | Discharge: 2010-12-11 | Disposition: A | Discharge status: Home / Self Care | Payer: Medicare Other | Source: Ambulatory Visit | Attending: Physical Therapy | Admitting: Physical Therapy

## 2010-12-11 DIAGNOSIS — M25562 Pain in left knee: Secondary | ICD-10-CM

## 2010-12-11 DIAGNOSIS — M25669 Stiffness of unspecified knee, not elsewhere classified: Secondary | ICD-10-CM

## 2010-12-11 DIAGNOSIS — R29898 Other symptoms and signs involving the musculoskeletal system: Secondary | ICD-10-CM

## 2010-12-11 NOTE — Progress Notes (Signed)
Physical Therapy Treatment Patient Details  Name: CORNESHIA HINES MRN: [id number] Date of Birth: 05/15/1925  Today's Date: 12/11/2010 Time: 1015-1110 Time Calculation (min): 55 min Visit#: 9  of 12   Re-eval: 12/19/10    Subjective: Symptoms/Limitations Symptoms: I'm doing better today.  Pt later c/o increased pain in the lateral aspedt of her thigh from lying prone and completing flexion exercises Pain Assessment Pain Score: 0-No pain  Precautions/Restrictions     Mobility (including Balance)       Exercise  strengthening and stretching per flowsheet   Modalities Modalities: Ultrasound Cryotherapy Number Minutes Cryotherapy: 8 Minutes Cryotherapy Location: Knee Ultrasound Ultrasound Location: Lateral aspect of L knee Ultrasound Parameters: 1.2 w/cm2 with 3mg -Hz Ultrasound Goals: Pain  Physical Therapy Assessment and Plan PT Assessment and Plan Clinical Impression Statement: Pt with improved ROM.  States she cooked for the first time this week.  Continue three more sessions an D/C Clinical Impairments Affecting Rehab Potential: Progress flexion PT Plan: Pt doing well with ADL's pain only with PROM    Goals  return to all ADL's  Problem List Patient Active Problem List  Diagnoses  . Stiffness of joint, not elsewhere classified, lower leg  . Pain in left knee  . Weakness of left leg    PT - End of Session Activity Tolerance: Patient limited by pain General Behavior During Session: [facility] for tasks performed Cognition: [facility] for tasks performed  Terran Hollenkamp,CINDY 12/11/2010, 11:20 AM

## 2010-12-13 ENCOUNTER — Ambulatory Visit (HOSPITAL_COMMUNITY)
Admission: RE | Admit: 2010-12-13 | Discharge: 2010-12-13 | Disposition: A | Discharge status: Home / Self Care | Payer: Medicare Other | Source: Ambulatory Visit | Attending: Physical Therapy | Admitting: Physical Therapy

## 2010-12-13 DIAGNOSIS — R29898 Other symptoms and signs involving the musculoskeletal system: Secondary | ICD-10-CM

## 2010-12-13 DIAGNOSIS — M25562 Pain in left knee: Secondary | ICD-10-CM

## 2010-12-13 DIAGNOSIS — M25669 Stiffness of unspecified knee, not elsewhere classified: Secondary | ICD-10-CM

## 2010-12-13 NOTE — Progress Notes (Signed)
Physical Therapy Treatment Patient Details  Name: Laura Martin MRN: [id number] Date of Birth: [birth year]  Today's Date: 12/13/2010 Time: 1027-2536 Time Calculation (min): 51 min Visit#: 10  of 12   Re-eval: 12/19/10    Subjective: Symptoms/Limitations Symptoms: Pt states that she is in no pain.          Exercise/Treatments  for stretching and strengthening per doc flow sheet. Modalities Modalities: Ultrasound Cryotherapy Number Minutes Cryotherapy: 8 Minutes Ultrasound Ultrasound Parameters: 1.2 w/cm2 at 3mg -Hz Ultrasound Goals: Pain  Physical Therapy Assessment and Plan PT Assessment and Plan Clinical Impression Statement: Pt with good technique with exercises Rehab Potential: Good Clinical Impairments Affecting Rehab Potential: decreased stability with eccentric contraction PT Plan: re-assess next tx.    Goals    Problem List Patient Active Problem List  Diagnoses  . Stiffness of joint, not elsewhere classified, lower leg  . Pain in left knee  . Weakness of left leg    PT - End of Session Activity Tolerance: Patient tolerated treatment well General Behavior During Session: [facility] for tasks performed Cognition: [facility] for tasks performed  RUSSELL,CINDY 12/13/2010, 11:12 AM

## 2010-12-16 ENCOUNTER — Ambulatory Visit (HOSPITAL_COMMUNITY)
Admission: RE | Admit: 2010-12-16 | Discharge: 2010-12-16 | Disposition: A | Discharge status: Home / Self Care | Payer: Medicare Other | Source: Ambulatory Visit | Attending: Pulmonary Disease | Admitting: Pulmonary Disease

## 2010-12-16 NOTE — Progress Notes (Signed)
Physical Therapy Treatment Patient Details  Name: BETSABE IGLESIA MRN: [id number] Date of Birth: 1926/01/14  Today's Date: 12/16/2010 Time: 4098-1191 Time Calculation (min): 54 min Visit#: 11  of 12   Re-eval: 12/19/10 Charges:  therex 35', ice 10'    Subjective: Symptoms/Limitations Symptoms: Pt reports sometimes pain in lateral knee but currently not having any pain. Pain Assessment Currently in Pain?: No/denies  Precautions/Restrictions :  Spinal Stenosis; unable to lay prone due to severe pain.   Objective:  AROM  L knee  10 to 122 degrees, PROM  0 to 126 degrees          (AROM R knee 0 to134 degrees)   Exercise/Treatments Aerobic Recumbent bike 8'@2 .5 seat 8 Standing  Rocker board x 2' R/L  L knee flexion w/ 4# x 15  Functional squats 15 reps  Hip ext x 15 4#  Hip abd x 15 4#  L SLS 19"  SLS w/ vectors L 5x10"  Forward step up 4" 15x  Lateral step 4" 15x  Forward step down 4" 15X Supine  Quad Sets 20X5" SAQ w/4# x 15  Bridge x 20  Heel slide with quad sets incorporated 10x 5"  PROM   Modalities Modalities: Cryotherapy Manual Therapy Manual Therapy: Edema management Other Manual Therapy: PROM for knee extension Cryotherapy Number Minutes Cryotherapy: 10 Minutes Cryotherapy Location: Knee Type of Cryotherapy: Ice pack  Physical Therapy Assessment and Plan PT Assessment and Plan Clinical Impression Statement: Pt. able to complete all exercises without difficulty.  Added/increased to 4# weight.  ROM increasing. PT Plan: RE-evaluate next visit.     Problem List Patient Active Problem List  Diagnoses  . Stiffness of joint, not elsewhere classified, lower leg  . Pain in left knee  . Weakness of left leg    PT - End of Session Activity Tolerance: Patient tolerated treatment well General Behavior During Session: [facility] for tasks performed Cognition: [facility] for tasks performed  Emeline Gins B 12/16/2010, 11:10 AM

## 2010-12-18 ENCOUNTER — Ambulatory Visit (HOSPITAL_COMMUNITY): Payer: Medicare Other | Admitting: *Deleted

## 2010-12-20 ENCOUNTER — Ambulatory Visit (HOSPITAL_COMMUNITY)
Admission: RE | Admit: 2010-12-20 | Discharge: 2010-12-20 | Disposition: A | Discharge status: Home / Self Care | Payer: Medicare Other | Source: Ambulatory Visit | Attending: Pulmonary Disease | Admitting: Pulmonary Disease

## 2010-12-20 DIAGNOSIS — M25562 Pain in left knee: Secondary | ICD-10-CM

## 2010-12-20 DIAGNOSIS — R29898 Other symptoms and signs involving the musculoskeletal system: Secondary | ICD-10-CM

## 2010-12-20 DIAGNOSIS — M25669 Stiffness of unspecified knee, not elsewhere classified: Secondary | ICD-10-CM

## 2010-12-20 NOTE — Progress Notes (Signed)
Physical Therapy Discharge Note  Patient Details  Name: Laura Martin MRN: [id number] Date of Birth: [birth year]  Today's Date: 12/20/2010 Time: 4098-1191 Time Calculation (min): 39 min Charges: 1 MMT, 1 ROM, 25' TE Visit#: 12  of 12   Re-eval:      Subjective Symptoms/Limitations Symptoms: Pt reports that she recieved a shot into her back and now has decreased her back pain and she is able to participate in more activities.  How long can you stand comfortably?: less than 10 minutes secondary to back pain. ( less than 10 minutes 11/19/10) How long can you walk comfortably?: Walks with grocercy cart for 45 minutes has increased fatgue at the end. ( was 15 minutes with a cane) Pain Assessment Currently in Pain?: No/denies   Assessment LLE AROM (degrees) Left Knee Extension 0-130: 0  Left Knee Flexion 0-140: 120  LLE PROM (degrees) Left Knee Extension 0-130: 0 Left Knee Flexion 0-140: 120 LLE Strength Left Hip Extension: 4/5 Left Hip ABduction: 5/5 Left Hip ADduction: 5/5 Left Knee Flexion: 5/5 Left Knee Extension: 5/5  Exercise/Treatments Reviewed all activities for HEP Aerobic Stationary Bike: 6' 2.5 Standing Terminal Knee Extension: 15 reps;Limitations;Left Terminal Knee Extension Limitations: 5 sec hold Lateral Step Up: Left;15 reps;Step Height: 4" Forward Step Up: 15 reps;Step Height: 4" Step Down: Left;15 reps;Step Height: 4" Functional Squat: 15 reps SLS: 3x30 Other Standing Knee Exercises: Sit to stand w/o UE support x10 Other Standing Knee Exercises: Hip Extension and abduction 15 reps each  Supine Short Arc Quad Sets: 15 reps;Other (comment) (Reviewed for HEP)  Physical Therapy Assessment and Plan PT Assessment and Plan Clinical Impression Statement: Ms. Huq has completed 12 outpatient PT visits and she has reached all of her goals.  She was greaty limited in the beginning with back pain, however it has resolved with recent injection which is allowing  her to complete her exercises without increased difficulty.  She will continue to benefit from an advanced HEP to continue with strengthening her core at home.  PT Plan: D/C w/advanced HEP    Goals PT Short Term Goals PT Short Term Goal 1: Pt to be able to sleep 4-5 hours PT Short Term Goal 1 - Progress: Met PT Short Term Goal 2: Pt ambulating in the house without an assistive device. PT Short Term Goal 2 - Progress: Met PT Short Term Goal 3: ROM o to 115 to allow more comfort in sitting. PT Short Term Goal 3 - Progress: Met PT Long Term Goals PT Long Term Goal 1: Pt to ambulate outside without an assistve device.- 4wk- PT Long Term Goal 1 - Progress: Met PT Long Term Goal 2: Pt to start dancing again.l4 wk PT Long Term Goal 2 - Progress: Met Long Term Goal 3: strength wnl-4wk Long Term Goal 4: Pt sleeping throughout the night- 4 wk Long Term Goal 4 Progress: Not met  Problem List Patient Active Problem List  Diagnoses  . Stiffness of joint, not elsewhere classified, lower leg  . Pain in left knee  . Weakness of left leg    PT - End of Session Activity Tolerance: Patient tolerated treatment well   Demri Poulton 12/20/2010, 10:52 AM

## 2011-04-01 DIAGNOSIS — Z961 Presence of intraocular lens: Secondary | ICD-10-CM | POA: Diagnosis not present

## 2011-05-21 DIAGNOSIS — I10 Essential (primary) hypertension: Secondary | ICD-10-CM | POA: Diagnosis not present

## 2011-05-21 DIAGNOSIS — I498 Other specified cardiac arrhythmias: Secondary | ICD-10-CM | POA: Diagnosis not present

## 2011-05-26 ENCOUNTER — Encounter: Payer: Self-pay | Admitting: Cardiology

## 2011-05-27 ENCOUNTER — Ambulatory Visit (INDEPENDENT_AMBULATORY_CARE_PROVIDER_SITE_OTHER): Payer: Medicare Other | Admitting: Cardiology

## 2011-05-27 ENCOUNTER — Encounter: Payer: Self-pay | Admitting: Cardiology

## 2011-05-27 VITALS — BP 172/78 | HR 47 | Resp 16 | Ht 67.0 in | Wt 149.0 lb

## 2011-05-27 DIAGNOSIS — I441 Atrioventricular block, second degree: Secondary | ICD-10-CM

## 2011-05-27 DIAGNOSIS — I10 Essential (primary) hypertension: Secondary | ICD-10-CM | POA: Insufficient documentation

## 2011-05-27 DIAGNOSIS — E782 Mixed hyperlipidemia: Secondary | ICD-10-CM | POA: Diagnosis not present

## 2011-05-27 NOTE — Assessment & Plan Note (Signed)
Discussed with patient and husband, including implications and possibility that she might ultimately require a pacemaker. For now will observe off Cardizem, place a Holter monitor. If she becomes more clearly symptomatic or has evidence of higher degree heart block, then with have her evaluated by EP.

## 2011-05-27 NOTE — Assessment & Plan Note (Signed)
Agree with change from Cardizem to Norvasc.

## 2011-05-27 NOTE — Progress Notes (Signed)
   Clinical Summary [name] is a 78 y.o.female referred by Dr. Juanetta Martin for cardiology consultation. Records indicate that she was found to have 2:1 AV block on ECG in setting of bradycardia with routine visit in late February. She was taken off Cardiozem at that time.  She states that she has been found to have low heart rates since undergoing left TKR in July 2012. She states that heart rate increased, but then decreased again in December. She presents home recordings of heart rates in the 40s to 60s. She reports no chest pain or unusual breathlessness. No syncope. She has had only very brief feelings of lightheadedness.  We reviewed her ECG today. She has been of Cardizem for just less than a week, states that heart has been up at times into the 60-70 range.  Allergies  Allergen Reactions  . Codeine   . Sulfa Antibiotics   . Tramadol     Current Outpatient Prescriptions  Medication Sig Dispense Refill  . amLODipine (NORVASC) 10 MG tablet Take 10 mg by mouth daily.      . hydrochlorothiazide (MICROZIDE) 12.5 MG capsule Take 12.5 mg by mouth daily.        Past Medical History  Diagnosis Date  . Goiter   . Mixed hyperlipidemia   . Essential hypertension, benign   . Arthritis   . Spinal stenosis     Past Surgical History  Procedure Date  . Breast biopsy   . Biopsy thyroid   . Left tkr     Family History  Problem Relation Age of Onset  . Cancer    . Stroke      Social History [name] reports that she has never smoked. She has never used smokeless tobacco. [name] reports that she does not drink alcohol.  Review of Systems Negative except as outlined.  Physical Examination Filed Vitals:   05/27/11 1336  BP: 172/78  Pulse: 47  Resp: 16   Normally nourished in NAD. HEENT: Conjunctiva and lids normal, oropharynx clear with moist mucosa. Neck: Supple, no elevated JVP or carotid bruits, no thyromegaly. Lungs: Clear to auscultation, nonlabored breathing at  rest. Cardiac: Regular rate and rhythm, no S3 or significant systolic murmur, no pericardial rub. Abdomen: Soft, nontender, bowel sounds present, no guarding or rebound. Extremities: No pitting edema, distal pulses 1-2+. Skin: Warm and dry. Musculoskeletal: No kyphosis. Neuropsychiatric: Alert and oriented x3, affect grossly appropriate.   ECG Sinus rhythm with 2nd degree heart block, 2:1 conduction. PR interval 178 ms.    Problem List and Plan

## 2011-05-27 NOTE — Patient Instructions (Signed)
**Note De-Identified  Obfuscation** Your physician has recommended that you wear a holter monitor. Holter monitors are medical devices that record the heart's electrical activity. Doctors most often use these monitors to diagnose arrhythmias. Arrhythmias are problems with the speed or rhythm of the heartbeat. The monitor is a small, portable device. You can wear one while you do your normal daily activities. This is usually used to diagnose what is causing palpitations/syncope (passing out).  Your physician recommends that you continue on your current medications as directed. Please refer to the Current Medication list given to you today.  Your physician recommends that you schedule a follow-up appointment in: 1 month

## 2011-05-27 NOTE — Assessment & Plan Note (Signed)
Managed by diet, followed by Dr. Juanetta Gosling.

## 2011-05-28 ENCOUNTER — Ambulatory Visit (HOSPITAL_COMMUNITY)
Admission: RE | Admit: 2011-05-28 | Discharge: 2011-05-28 | Disposition: A | Discharge status: Home / Self Care | Payer: Medicare Other | Source: Ambulatory Visit | Attending: Cardiology | Admitting: Cardiology

## 2011-05-28 DIAGNOSIS — I441 Atrioventricular block, second degree: Secondary | ICD-10-CM | POA: Insufficient documentation

## 2011-05-28 NOTE — Progress Notes (Signed)
*  PRELIMINARY RESULTS* Echocardiogram 24H Holter monitor has been performed.  Laura Martin 05/28/2011, 12:58 PM

## 2011-07-02 ENCOUNTER — Ambulatory Visit (INDEPENDENT_AMBULATORY_CARE_PROVIDER_SITE_OTHER): Payer: Medicare Other | Admitting: Cardiology

## 2011-07-02 ENCOUNTER — Encounter: Payer: Self-pay | Admitting: Cardiology

## 2011-07-02 VITALS — BP 162/64 | HR 52 | Resp 16 | Ht 67.0 in | Wt 145.0 lb

## 2011-07-02 DIAGNOSIS — I441 Atrioventricular block, second degree: Secondary | ICD-10-CM

## 2011-07-02 DIAGNOSIS — I10 Essential (primary) hypertension: Secondary | ICD-10-CM

## 2011-07-02 NOTE — Progress Notes (Signed)
   Clinical Summary Laura Martin is a 78 y.o.female presenting for followup. She was seen in March for evaluation of incidentally noted second degree heart block on Cardizem CD. This medication was discontinued and a Holter monitor was placed. Results indicated sinus rhythm with episodic second degree, type I Wenckebach conduction and 2:1 block.  Average chart rate was 58 beats per minute with no pauses. No other sustained arrhythmias.  She presents with her husband today. States she has had no lightheadedness, no syncope. Chronic fatigue is not worse. She shows me home blood pressure and heart rate checks, most of her heart rates are in the 60s to 70s, occasionally in the 40s. We discussed the implications of her monitor, intermittent second degree heart block. For now she was most comfortable with observation, realizing that pacemaker placement may be required at some point.   Allergies  Allergen Reactions  . Codeine   . Sulfa Antibiotics   . Tramadol     Current Outpatient Prescriptions  Medication Sig Dispense Refill  . amLODipine (NORVASC) 10 MG tablet Take 10 mg by mouth daily.      . hydrochlorothiazide (MICROZIDE) 12.5 MG capsule Take 12.5 mg by mouth daily.        Past Medical History  Diagnosis Date  . Goiter   . Mixed hyperlipidemia   . Essential hypertension, benign   . Arthritis   . Spinal stenosis   . Second degree heart block     Social History Ms. Covin reports that she has never smoked. She has never used smokeless tobacco. Ms. Townshend reports that she does not drink alcohol.  Review of Systems Recent cough and trouble with the pollen. No fevers or chills. No chest pain. Otherwise negative.  Physical Examination Filed Vitals:   07/02/11 0906  BP: 162/64  Pulse: 52  Resp: 16    Normally nourished in NAD.  HEENT: Conjunctiva and lids normal, oropharynx clear with moist mucosa.  Neck: Supple, no elevated JVP or carotid bruits, no thyromegaly.  Lungs: Clear  to auscultation, nonlabored breathing at rest.  Cardiac: Regular rate and rhythm, no S3 or significant systolic murmur, no pericardial rub.  Abdomen: Soft, nontender, bowel sounds present, no guarding or rebound.  Extremities: No pitting edema, distal pulses 1-2+.  Skin: Warm and dry.  Musculoskeletal: No kyphosis.  Neuropsychiatric: Alert and oriented x3, affect grossly appropriate.    Problem List and Plan

## 2011-07-02 NOTE — Patient Instructions (Signed)
**Note De-identified  Obfuscation** Your physician recommends that you continue on your current medications as directed. Please refer to the Current Medication list given to you today.  Your physician recommends that you schedule a follow-up appointment in: 6 months  

## 2011-07-02 NOTE — Assessment & Plan Note (Signed)
Generally improved off Cardizem CD. Holter monitor reviewed above. For now our plan is observation, although if she manifests any progressive symptoms, pacemaker implantation will need to be considered. Followup arranged.

## 2011-07-02 NOTE — Assessment & Plan Note (Signed)
Continue present medications. She was switched from Cardizem CD to Norvasc.

## 2011-08-20 DIAGNOSIS — E049 Nontoxic goiter, unspecified: Secondary | ICD-10-CM | POA: Diagnosis not present

## 2011-08-20 DIAGNOSIS — E785 Hyperlipidemia, unspecified: Secondary | ICD-10-CM | POA: Diagnosis not present

## 2011-08-20 DIAGNOSIS — I10 Essential (primary) hypertension: Secondary | ICD-10-CM | POA: Diagnosis not present

## 2011-08-20 DIAGNOSIS — E039 Hypothyroidism, unspecified: Secondary | ICD-10-CM | POA: Diagnosis not present

## 2011-09-23 DIAGNOSIS — IMO0002 Reserved for concepts with insufficient information to code with codable children: Secondary | ICD-10-CM | POA: Diagnosis not present

## 2011-09-23 DIAGNOSIS — M48061 Spinal stenosis, lumbar region without neurogenic claudication: Secondary | ICD-10-CM | POA: Diagnosis not present

## 2011-11-19 DIAGNOSIS — I499 Cardiac arrhythmia, unspecified: Secondary | ICD-10-CM | POA: Diagnosis not present

## 2011-11-19 DIAGNOSIS — I10 Essential (primary) hypertension: Secondary | ICD-10-CM | POA: Diagnosis not present

## 2011-11-19 DIAGNOSIS — M545 Low back pain: Secondary | ICD-10-CM | POA: Diagnosis not present

## 2012-02-11 DIAGNOSIS — I10 Essential (primary) hypertension: Secondary | ICD-10-CM | POA: Diagnosis not present

## 2012-02-11 DIAGNOSIS — M1990 Unspecified osteoarthritis, unspecified site: Secondary | ICD-10-CM | POA: Diagnosis not present

## 2012-02-11 DIAGNOSIS — I499 Cardiac arrhythmia, unspecified: Secondary | ICD-10-CM | POA: Diagnosis not present

## 2012-02-27 ENCOUNTER — Encounter: Payer: Self-pay | Admitting: Adult Health

## 2012-02-27 ENCOUNTER — Ambulatory Visit (INDEPENDENT_AMBULATORY_CARE_PROVIDER_SITE_OTHER): Payer: Medicare Other | Admitting: Adult Health

## 2012-02-27 VITALS — BP 150/70 | HR 74 | Ht 67.0 in | Wt 159.0 lb

## 2012-02-27 DIAGNOSIS — I10 Essential (primary) hypertension: Secondary | ICD-10-CM

## 2012-02-27 DIAGNOSIS — I441 Atrioventricular block, second degree: Secondary | ICD-10-CM

## 2012-02-27 DIAGNOSIS — E782 Mixed hyperlipidemia: Secondary | ICD-10-CM

## 2012-02-27 LAB — BASIC METABOLIC PANEL
BUN: 18 mg/dL (ref 6–23)
CO2: 29 mEq/L (ref 19–32)
Chloride: 106 mEq/L (ref 96–112)
Creat: 0.94 mg/dL (ref 0.50–1.10)

## 2012-02-27 NOTE — Assessment & Plan Note (Signed)
Followed by Dr. Hawkins. 

## 2012-02-27 NOTE — Progress Notes (Signed)
   HPI: Laura Martin is an 78 y/o patient of Dr.McDowell we are seeing for ongoing assessment and treatment of Second Degree Type l Wenckebach, hypertension, and mixed hyperlipidemia. She was taken off of diltiazem by Dr. Diona Browner on last visit and placed on amlodipine 10 mg daily instead. She was also on hyzaar 50/12.5 mg but this was increased by Dr.Hawkins to 100/12.5mg  due to elevated BP off of the diltiazem. She has been keeping track of her BP and recording it daily for 6 months. She brings these recordings to this appointment. She is feeling much better off of the diltiazem. She at one time was having transient dizziness which is now eliminated. She is without cardiac complaint at this time.   Allergies  Allergen Reactions  . Codeine   . Sulfa Antibiotics   . Tramadol     Current Outpatient Prescriptions  Medication Sig Dispense Refill  . amLODipine (NORVASC) 10 MG tablet Take 10 mg by mouth daily.      . Calcium Carbonate (CALCIUM 600 PO) Take 600 mg by mouth daily.      . Cholecalciferol (VITAMIN D-3) 1000 UNITS CAPS Take 1,000 mg by mouth daily.      . Coenzyme Q10 (CO Q 10) 100 MG CAPS Take 100 mg by mouth daily.      . fish oil-omega-3 fatty acids 1000 MG capsule Take 1 g by mouth daily.      Marland Kitchen losartan-hydrochlorothiazide (HYZAAR) 100-12.5 MG per tablet Take 1 tablet by mouth daily.       . naproxen sodium (ANAPROX) 220 MG tablet Take 440 mg by mouth daily.        Past Medical History  Diagnosis Date  . Goiter   . Mixed hyperlipidemia   . Essential hypertension, benign   . Arthritis   . Spinal stenosis   . Second degree heart block     Past Surgical History  Procedure Date  . Breast biopsy   . Biopsy thyroid   . Left tkr     HQI:ONGEXB of systems complete and found to be negative unless listed above  PHYSICAL EXAM BP 150/70  Pulse 74  Ht 5\' 7"  (1.702 m)  Wt 159 lb (72.122 kg)  BMI 24.90 kg/m2  General: Well developed, well nourished, in no acute  distress Head: Eyes PERRLA, No xanthomas.   Normal cephalic and atramatic  Lungs: Clear bilaterally to auscultation and percussion. Heart: HRRR S1 S2, with 1/6 systolic murmur.  Pulses are 2+ & equal.            No carotid bruit. No JVD.  No abdominal bruits. No femoral bruits. Abdomen: Bowel sounds are positive, abdomen soft and non-tender without masses or                  Hernia's noted. Msk:  Back normal, normal gait. Normal strength and tone for age. Extremities: No clubbing, cyanosis or edema.  DP +1 Neuro: Alert and oriented X 3. Psych:  Good affect, responds appropriately  EKG: NSR with Second Degree AV block rate of 74 bpm  ASSESSMENT AND PLAN

## 2012-02-27 NOTE — Progress Notes (Deleted)
Name: [facility]    DOB: 10/06/1925  Age: 78 y.o.  MR#: [id number]       PCP:  Fredirick Maudlin, MD      Insurance: @PAYORNAME @   CC:   No chief complaint on file.   VS BP 150/70  Pulse 74  Ht 5\' 7"  (1.702 m)  Wt 159 lb (72.122 kg)  BMI 24.90 kg/m2  Weights Current Weight  02/27/12 159 lb (72.122 kg)  07/02/11 145 lb (65.772 kg)  05/27/11 149 lb (67.586 kg)    Blood Pressure  BP Readings from Last 3 Encounters:  02/27/12 150/70  07/02/11 162/64  05/27/11 172/78     Admit date:  (Not on file) Last encounter with RMR:  Visit date not found   Allergy Allergies  Allergen Reactions  . Codeine   . Sulfa Antibiotics   . Tramadol     Current Outpatient Prescriptions  Medication Sig Dispense Refill  . amLODipine (NORVASC) 10 MG tablet Take 10 mg by mouth daily.      . Calcium Carbonate (CALCIUM 600 PO) Take 600 mg by mouth daily.      . Cholecalciferol (VITAMIN D-3) 1000 UNITS CAPS Take 1,000 mg by mouth daily.      . Coenzyme Q10 (CO Q 10) 100 MG CAPS Take 100 mg by mouth daily.      . fish oil-omega-3 fatty acids 1000 MG capsule Take 1 g by mouth daily.      Marland Kitchen losartan-hydrochlorothiazide (HYZAAR) 100-12.5 MG per tablet Take 1 tablet by mouth daily.       . naproxen sodium (ANAPROX) 220 MG tablet Take 440 mg by mouth daily.        Discontinued Meds:    Medications Discontinued During This Encounter  Medication Reason  . hydrochlorothiazide (MICROZIDE) 12.5 MG capsule Error    Patient Active Problem List  Diagnosis  . Stiffness of joint, not elsewhere classified, lower leg  . Pain in left knee  . Weakness of left leg  . Second degree heart block  . Essential hypertension, benign  . Mixed hyperlipidemia    LABS No visits with results within 3 Month(s) from this visit. Latest known visit with results is:  Admission on 10/17/2010, Discharged on 10/20/2010  Component Date Value  . WBC 10/18/2010 9.1   . RBC 10/18/2010 3.03*  . Hemoglobin 10/18/2010  9.7*  . HCT 10/18/2010 26.6*  . MCV 10/18/2010 87.8   . [facility] 10/18/2010 32.0   . MCHC 10/18/2010 36.5*  . RDW 10/18/2010 14.0   . Platelets 10/18/2010 231   . Sodium 10/18/2010 133*  . Potassium 10/18/2010 3.9   . Chloride 10/18/2010 101   . CO2 10/18/2010 25   . Glucose, Bld 10/18/2010 170*  . BUN 10/18/2010 13   . Creatinine, Ser 10/18/2010 0.55   . Calcium 10/18/2010 8.8   . GFR calc non Af Amer 10/18/2010 >60   . GFR calc Af Amer 10/18/2010 >60   . WBC 10/19/2010 8.8   . RBC 10/19/2010 2.82*  . Hemoglobin 10/19/2010 8.8*  . HCT 10/19/2010 25.0*  . MCV 10/19/2010 88.7   . [facility] 10/19/2010 31.2   . MCHC 10/19/2010 35.2   . RDW 10/19/2010 14.2   . Platelets 10/19/2010 211   . Sodium 10/19/2010 135   . Potassium 10/19/2010 3.6   . Chloride 10/19/2010 101   . CO2 10/19/2010 29   . Glucose, Bld 10/19/2010 120*  . BUN 10/19/2010 12   . Creatinine, Ser 10/19/2010  0.55   . Calcium 10/19/2010 9.0   . GFR calc non Af Amer 10/19/2010 >60   . GFR calc Af Amer 10/19/2010 >60   . WBC 10/20/2010 9.0   . RBC 10/20/2010 2.56*  . Hemoglobin 10/20/2010 8.1*  . HCT 10/20/2010 22.2*  . MCV 10/20/2010 86.7   . [facility] 10/20/2010 31.6   . MCHC 10/20/2010 36.5*  . RDW 10/20/2010 13.9   . Platelets 10/20/2010 187      Results for this Opt Visit:     Results for orders placed during the hospital encounter of 10/17/10  CBC      Component Value Range   WBC 9.1  4.0 - 10.5 K/uL   RBC 3.03 (*) 3.87 - 5.11 MIL/uL   Hemoglobin 9.7 (*) 12.0 - 15.0 g/dL   HCT 16.1 (*) 09.6 - 04.5 %   MCV 87.8  78.0 - 100.0 fL   MCH 32.0  26.0 - 34.0 pg   MCHC 36.5 (*) 30.0 - 36.0 g/dL   RDW 40.9  81.1 - 91.4 %   Platelets 231  150 - 400 K/uL  BASIC METABOLIC PANEL      Component Value Range   Sodium 133 (*) 135 - 145 mEq/L   Potassium 3.9  3.5 - 5.1 mEq/L   Chloride 101  96 - 112 mEq/L   CO2 25  19 - 32 mEq/L   Glucose, Bld 170 (*) 70 - 99 mg/dL   BUN 13  6 - 23 mg/dL   Creatinine, Ser 7.82  0.50 -  1.10 mg/dL   Calcium 8.8  8.4 - 95.6 mg/dL   GFR calc non Af Amer >60  >60 mL/min   GFR calc Af Amer >60  >60 mL/min  CBC      Component Value Range   WBC 8.8  4.0 - 10.5 K/uL   RBC 2.82 (*) 3.87 - 5.11 MIL/uL   Hemoglobin 8.8 (*) 12.0 - 15.0 g/dL   HCT 21.3 (*) 08.6 - 57.8 %   MCV 88.7  78.0 - 100.0 fL   MCH 31.2  26.0 - 34.0 pg   MCHC 35.2  30.0 - 36.0 g/dL   RDW 46.9  62.9 - 52.8 %   Platelets 211  150 - 400 K/uL  BASIC METABOLIC PANEL      Component Value Range   Sodium 135  135 - 145 mEq/L   Potassium 3.6  3.5 - 5.1 mEq/L   Chloride 101  96 - 112 mEq/L   CO2 29  19 - 32 mEq/L   Glucose, Bld 120 (*) 70 - 99 mg/dL   BUN 12  6 - 23 mg/dL   Creatinine, Ser 4.13  0.50 - 1.10 mg/dL   Calcium 9.0  8.4 - 24.4 mg/dL   GFR calc non Af Amer >60  >60 mL/min   GFR calc Af Amer >60  >60 mL/min  CBC      Component Value Range   WBC 9.0  4.0 - 10.5 K/uL   RBC 2.56 (*) 3.87 - 5.11 MIL/uL   Hemoglobin 8.1 (*) 12.0 - 15.0 g/dL   HCT 01.0 (*) 27.2 - 53.6 %   MCV 86.7  78.0 - 100.0 fL   MCH 31.6  26.0 - 34.0 pg   MCHC 36.5 (*) 30.0 - 36.0 g/dL   RDW 64.4  03.4 - 74.2 %   Platelets 187  150 - 400 K/uL    EKG Orders placed in visit on 02/27/12  .  EKG 12-LEAD     Prior Assessment and Plan Problem List as of 02/27/2012          Stiffness of joint, not elsewhere classified, lower leg   Pain in left knee   Weakness of left leg   Second degree heart block   Last Assessment & Plan Note   07/02/2011 Office Visit Signed 07/02/2011  9:24 AM by Jonelle Sidle, MD    Generally improved off Cardizem CD. Holter monitor reviewed above. For now our plan is observation, although if she manifests any progressive symptoms, pacemaker implantation will need to be considered. Followup arranged.    Essential hypertension, benign   Last Assessment & Plan Note   07/02/2011 Office Visit Signed 07/02/2011  9:24 AM by Jonelle Sidle, MD    Continue present medications. She was switched from Cardizem  CD to Norvasc.    Mixed hyperlipidemia   Last Assessment & Plan Note   05/27/2011 Office Visit Signed 05/27/2011  2:19 PM by Jonelle Sidle, MD    Managed by diet, followed by Dr. Juanetta Gosling.        Imaging: No results found.   FRS Calculation: Score not calculated

## 2012-02-27 NOTE — Assessment & Plan Note (Signed)
She is currently asymptomatic. Review of her BP recordings show HR avg in the 70's. She is feeling better off diltiazem. Will continue current medication regimen. Will see her again in 6 months.

## 2012-02-27 NOTE — Patient Instructions (Signed)
Your physician recommends that you schedule a follow-up appointment in: 6 MONTHS WITH SM  Your physician recommends that you return for lab work in: TODAY

## 2012-02-27 NOTE — Assessment & Plan Note (Signed)
BP is well controlled. I have review BP log. Avg BP 134/70.  Has gone as low as 118 systolic and as high as 154 systolic.  She is tolerating the hyzaar dose without dizziness. I will check BMET for kidney fx as she has not had blood work completed since increased dose of hyzaar 3 months ago.

## 2012-03-03 ENCOUNTER — Encounter: Payer: Self-pay | Admitting: *Deleted

## 2012-03-03 DIAGNOSIS — E782 Mixed hyperlipidemia: Secondary | ICD-10-CM | POA: Diagnosis not present

## 2012-03-03 DIAGNOSIS — I441 Atrioventricular block, second degree: Secondary | ICD-10-CM | POA: Diagnosis not present

## 2012-03-03 DIAGNOSIS — I10 Essential (primary) hypertension: Secondary | ICD-10-CM | POA: Diagnosis not present

## 2012-03-03 NOTE — Addendum Note (Signed)
Addended by: Derry Lory A on: 03/03/2012 10:05 AM   Modules accepted: Orders

## 2012-03-10 DIAGNOSIS — I10 Essential (primary) hypertension: Secondary | ICD-10-CM | POA: Diagnosis not present

## 2012-03-10 DIAGNOSIS — I441 Atrioventricular block, second degree: Secondary | ICD-10-CM | POA: Diagnosis not present

## 2012-03-10 DIAGNOSIS — E782 Mixed hyperlipidemia: Secondary | ICD-10-CM | POA: Diagnosis not present

## 2012-03-10 LAB — BASIC METABOLIC PANEL
BUN: 23 mg/dL (ref 6–23)
Chloride: 104 mEq/L (ref 96–112)
Creat: 0.82 mg/dL (ref 0.50–1.10)
Glucose, Bld: 90 mg/dL (ref 70–99)

## 2012-03-12 ENCOUNTER — Encounter: Payer: Self-pay | Admitting: *Deleted

## 2012-05-11 DIAGNOSIS — Z961 Presence of intraocular lens: Secondary | ICD-10-CM | POA: Diagnosis not present

## 2012-06-09 DIAGNOSIS — I10 Essential (primary) hypertension: Secondary | ICD-10-CM | POA: Diagnosis not present

## 2012-06-23 DIAGNOSIS — I10 Essential (primary) hypertension: Secondary | ICD-10-CM | POA: Diagnosis not present

## 2012-07-13 DIAGNOSIS — I498 Other specified cardiac arrhythmias: Secondary | ICD-10-CM | POA: Diagnosis not present

## 2012-07-13 DIAGNOSIS — I10 Essential (primary) hypertension: Secondary | ICD-10-CM | POA: Diagnosis not present

## 2012-07-13 DIAGNOSIS — E785 Hyperlipidemia, unspecified: Secondary | ICD-10-CM | POA: Diagnosis not present

## 2012-08-30 ENCOUNTER — Ambulatory Visit: Payer: Medicare Other | Admitting: Cardiology

## 2012-09-02 ENCOUNTER — Encounter: Payer: Self-pay | Admitting: Cardiology

## 2012-09-02 ENCOUNTER — Ambulatory Visit (INDEPENDENT_AMBULATORY_CARE_PROVIDER_SITE_OTHER): Payer: Medicare Other | Admitting: Cardiology

## 2012-09-02 VITALS — BP 180/75 | HR 70 | Ht 67.0 in | Wt 155.0 lb

## 2012-09-02 DIAGNOSIS — I10 Essential (primary) hypertension: Secondary | ICD-10-CM

## 2012-09-02 DIAGNOSIS — I441 Atrioventricular block, second degree: Secondary | ICD-10-CM

## 2012-09-02 NOTE — Assessment & Plan Note (Signed)
Blood pressure is elevated today. She is followed by Dr. Juanetta Gosling, on Norvasc and Hyzaar.

## 2012-09-02 NOTE — Progress Notes (Signed)
   Clinical Summary Laura Martin is an 79 y.o.female last seen in December 2013 by Ms. Lawrence NP. She has a history of second-degree heart block, previously taken off Cardizem CD, and followed conservatively so far.  She is here with her husband, reports no issues with recurrent dizziness, no syncope. No perceived major functional limitations.  ECG today shows normal sinus rhythm with PACs, no evidence of heart block.   Allergies  Allergen Reactions  . Codeine   . Sulfa Antibiotics   . Tramadol     Current Outpatient Prescriptions  Medication Sig Dispense Refill  . amLODipine (NORVASC) 10 MG tablet Take 10 mg by mouth daily.      . Calcium Carbonate (CALCIUM 600 PO) Take 600 mg by mouth daily.      . Cholecalciferol (VITAMIN D-3) 1000 UNITS CAPS Take 1,000 mg by mouth daily.      . Coenzyme Q10 (CO Q 10) 100 MG CAPS Take 100 mg by mouth daily.      . fish oil-omega-3 fatty acids 1000 MG capsule Take 1 g by mouth daily.      Marland Kitchen losartan-hydrochlorothiazide (HYZAAR) 100-12.5 MG per tablet Take 1 tablet by mouth daily.       . naproxen sodium (ANAPROX) 220 MG tablet Take 440 mg by mouth daily.       No current facility-administered medications for this visit.    Past Medical History  Diagnosis Date  . Goiter   . Mixed hyperlipidemia   . Essential hypertension, benign   . Arthritis   . Spinal stenosis   . Second degree heart block     Social History Laura Martin reports that she has never smoked. She has never used smokeless tobacco. Laura Martin reports that she does not drink alcohol.  Review of Systems Negative except as outlined.  Physical Examination Filed Vitals:   09/02/12 0911  BP: 180/75  Pulse: 70   Filed Weights   09/02/12 0911  Weight: 155 lb (70.308 kg)    Normally nourished in NAD.  HEENT: Conjunctiva and lids normal, oropharynx clear with moist mucosa.  Neck: Supple, no elevated JVP or carotid bruits, no thyromegaly.  Lungs: Clear to auscultation,  nonlabored breathing at rest.  Cardiac: Regular rate and rhythm, no S3, 2/6 systolic murmur, no pericardial rub.   Extremities: No pitting edema, distal pulses 1-2+.  Neuropsychiatric: Alert and oriented x3, affect grossly appropriate.   Problem List and Plan   Second degree heart block Transient and mainly noted when she was previously on Cardizem CD. She has no symptoms at this point, ECG shows sinus rhythm without heart block.  Essential hypertension, benign Blood pressure is elevated today. She is followed by Dr. Juanetta Gosling, on Norvasc and Hyzaar.    Jonelle Sidle, M.D., F.A.C.C.

## 2012-09-02 NOTE — Assessment & Plan Note (Signed)
Transient and mainly noted when she was previously on Cardizem CD. She has no symptoms at this point, ECG shows sinus rhythm without heart block.

## 2012-09-02 NOTE — Patient Instructions (Addendum)
Your physician recommends that you schedule a follow-up appointment in: 6 months  Your physician recommends that you continue on your current medications as directed. Please refer to the Current Medication list given to you today.  

## 2012-10-15 DIAGNOSIS — N390 Urinary tract infection, site not specified: Secondary | ICD-10-CM | POA: Diagnosis not present

## 2012-10-15 DIAGNOSIS — M545 Low back pain: Secondary | ICD-10-CM | POA: Diagnosis not present

## 2012-10-15 DIAGNOSIS — I10 Essential (primary) hypertension: Secondary | ICD-10-CM | POA: Diagnosis not present

## 2012-11-16 DIAGNOSIS — I498 Other specified cardiac arrhythmias: Secondary | ICD-10-CM | POA: Diagnosis not present

## 2012-11-16 DIAGNOSIS — R7309 Other abnormal glucose: Secondary | ICD-10-CM | POA: Diagnosis not present

## 2012-11-16 DIAGNOSIS — B029 Zoster without complications: Secondary | ICD-10-CM | POA: Diagnosis not present

## 2012-11-16 DIAGNOSIS — R7989 Other specified abnormal findings of blood chemistry: Secondary | ICD-10-CM | POA: Diagnosis not present

## 2012-11-16 DIAGNOSIS — I10 Essential (primary) hypertension: Secondary | ICD-10-CM | POA: Diagnosis not present

## 2013-02-04 DIAGNOSIS — H903 Sensorineural hearing loss, bilateral: Secondary | ICD-10-CM | POA: Diagnosis not present

## 2013-02-15 DIAGNOSIS — I498 Other specified cardiac arrhythmias: Secondary | ICD-10-CM | POA: Diagnosis not present

## 2013-02-15 DIAGNOSIS — M48061 Spinal stenosis, lumbar region without neurogenic claudication: Secondary | ICD-10-CM | POA: Diagnosis not present

## 2013-02-15 DIAGNOSIS — I10 Essential (primary) hypertension: Secondary | ICD-10-CM | POA: Diagnosis not present

## 2013-02-15 DIAGNOSIS — M545 Low back pain: Secondary | ICD-10-CM | POA: Diagnosis not present

## 2013-03-08 ENCOUNTER — Ambulatory Visit (INDEPENDENT_AMBULATORY_CARE_PROVIDER_SITE_OTHER): Payer: Medicare Other | Admitting: Cardiology

## 2013-03-08 ENCOUNTER — Encounter: Payer: Self-pay | Admitting: Cardiology

## 2013-03-08 VITALS — BP 158/60 | HR 81 | Ht 67.0 in | Wt 158.0 lb

## 2013-03-08 DIAGNOSIS — I10 Essential (primary) hypertension: Secondary | ICD-10-CM | POA: Diagnosis not present

## 2013-03-08 DIAGNOSIS — I441 Atrioventricular block, second degree: Secondary | ICD-10-CM

## 2013-03-08 NOTE — Assessment & Plan Note (Signed)
Quiescent, plan will be annual followup going forward unless she develops any symptoms or significant bradycardia.

## 2013-03-08 NOTE — Patient Instructions (Signed)
Your physician wants you to follow-up in: ONE YEAR You will receive a reminder letter in the mail two months in advance. If you don't receive a letter, please call our office to schedule the follow-up appointment.  

## 2013-03-08 NOTE — Progress Notes (Signed)
    Clinical Summary Ms. Raine is an 79 y.o.female last seen in June. She has a history of transient second-degree heart block that resolved when taken off Cardizem CD. She is here with her husband, reports no significant cardiac symptoms. She has had no palpitations or syncope.  ECG from June showed sinus rhythm with normal PR interval and sinus arrhythmia. I reviewed her home heart rate checks, none below the 60s.   Allergies  Allergen Reactions  . Codeine   . Sulfa Antibiotics   . Tramadol     Current Outpatient Prescriptions  Medication Sig Dispense Refill  . amLODipine (NORVASC) 10 MG tablet Take 10 mg by mouth daily.      . Calcium Carbonate (CALCIUM 600 PO) Take 600 mg by mouth daily.      . Cholecalciferol (VITAMIN D-3) 1000 UNITS CAPS Take 1,000 mg by mouth daily.      . Coenzyme Q10 (CO Q 10) 100 MG CAPS Take 100 mg by mouth daily.      . fish oil-omega-3 fatty acids 1000 MG capsule Take 1 g by mouth daily.       No current facility-administered medications for this visit.    Past Medical History  Diagnosis Date  . Goiter   . Mixed hyperlipidemia   . Essential hypertension, benign   . Arthritis   . Spinal stenosis   . Second degree heart block     Social History Ms. Lederman reports that she has never smoked. She has never used smokeless tobacco. Ms. Pevey reports that she does not drink alcohol.  Review of Systems Negative except as outlined.  Physical Examination Filed Vitals:   03/08/13 1111  BP: 158/60  Pulse: 81   Filed Weights   03/08/13 1111  Weight: 158 lb (71.668 kg)    Normally nourished in NAD.  HEENT: Conjunctiva and lids normal, oropharynx clear with moist mucosa.  Neck: Supple, no elevated JVP or carotid bruits, no thyromegaly.  Lungs: Clear to auscultation, nonlabored breathing at rest.  Cardiac: Regular rate and rhythm, no S3, 2/6 systolic murmur, no pericardial rub.  Extremities: No pitting edema, distal pulses 1-2+.    Neuropsychiatric: Alert and oriented x3, affect grossly appropriate.   Problem List and Plan   Second degree heart block Quiescent, plan will be annual followup going forward unless she develops any symptoms or significant bradycardia.  Essential hypertension, benign No change to current regimen, keep followup with Dr. Juanetta Gosling.    Jonelle Sidle, M.D., F.A.C.C.

## 2013-03-08 NOTE — Assessment & Plan Note (Signed)
No change to current regimen, keep followup with Dr. Juanetta Gosling.

## 2013-03-21 DIAGNOSIS — M48061 Spinal stenosis, lumbar region without neurogenic claudication: Secondary | ICD-10-CM | POA: Diagnosis not present

## 2013-03-21 DIAGNOSIS — IMO0002 Reserved for concepts with insufficient information to code with codable children: Secondary | ICD-10-CM | POA: Diagnosis not present

## 2013-05-24 DIAGNOSIS — Z961 Presence of intraocular lens: Secondary | ICD-10-CM | POA: Diagnosis not present

## 2013-06-02 DIAGNOSIS — L570 Actinic keratosis: Secondary | ICD-10-CM | POA: Diagnosis not present

## 2013-06-02 DIAGNOSIS — D235 Other benign neoplasm of skin of trunk: Secondary | ICD-10-CM | POA: Diagnosis not present

## 2013-06-02 DIAGNOSIS — L820 Inflamed seborrheic keratosis: Secondary | ICD-10-CM | POA: Diagnosis not present

## 2013-06-14 DIAGNOSIS — M545 Low back pain, unspecified: Secondary | ICD-10-CM | POA: Diagnosis not present

## 2013-06-14 DIAGNOSIS — I498 Other specified cardiac arrhythmias: Secondary | ICD-10-CM | POA: Diagnosis not present

## 2013-06-14 DIAGNOSIS — I10 Essential (primary) hypertension: Secondary | ICD-10-CM | POA: Diagnosis not present

## 2013-06-14 DIAGNOSIS — M1990 Unspecified osteoarthritis, unspecified site: Secondary | ICD-10-CM | POA: Diagnosis not present

## 2013-10-07 DIAGNOSIS — Z0000 Encounter for general adult medical examination without abnormal findings: Secondary | ICD-10-CM | POA: Diagnosis not present

## 2013-10-18 ENCOUNTER — Ambulatory Visit (INDEPENDENT_AMBULATORY_CARE_PROVIDER_SITE_OTHER): Payer: Medicare Other | Admitting: Adult Health

## 2013-10-18 ENCOUNTER — Encounter: Payer: Self-pay | Admitting: Adult Health

## 2013-10-18 VITALS — BP 168/62 | HR 49 | Ht 67.0 in | Wt 154.0 lb

## 2013-10-18 DIAGNOSIS — I498 Other specified cardiac arrhythmias: Secondary | ICD-10-CM | POA: Diagnosis not present

## 2013-10-18 DIAGNOSIS — I441 Atrioventricular block, second degree: Secondary | ICD-10-CM | POA: Diagnosis not present

## 2013-10-18 DIAGNOSIS — I10 Essential (primary) hypertension: Secondary | ICD-10-CM | POA: Diagnosis not present

## 2013-10-18 DIAGNOSIS — R001 Bradycardia, unspecified: Secondary | ICD-10-CM

## 2013-10-18 DIAGNOSIS — R5381 Other malaise: Secondary | ICD-10-CM | POA: Diagnosis not present

## 2013-10-18 DIAGNOSIS — R5383 Other fatigue: Secondary | ICD-10-CM

## 2013-10-18 DIAGNOSIS — R5382 Chronic fatigue, unspecified: Secondary | ICD-10-CM

## 2013-10-18 NOTE — Addendum Note (Signed)
Addended by: Hilarie Fredrickson T on: 10/18/2013 04:24 PM   Modules accepted: Orders

## 2013-10-18 NOTE — Progress Notes (Deleted)
Name: [facility]    DOB: 06-20-1925  Age: 80 y.o.  MR#: [id number]       PCP:  Alonza Bogus, MD      Insurance: Payor: MEDICARE / Plan: MEDICARE PART A AND B / Product Type: *No Product type* /   CC:   No chief complaint on file.   VS Filed Vitals:   10/18/13 1502  BP: 168/62  Pulse: 49  Height: 5\' 7"  (1.702 m)  Weight: 154 lb (69.854 kg)    Weights Current Weight  10/18/13 154 lb (69.854 kg)  03/08/13 158 lb (71.668 kg)  09/02/12 155 lb (70.308 kg)    Blood Pressure  BP Readings from Last 3 Encounters:  10/18/13 168/62  03/08/13 158/60  09/02/12 180/75     Admit date:  (Not on file) Last encounter with RMR:  Visit date not found   Allergy Codeine; Sulfa antibiotics; and Tramadol  Current Outpatient Prescriptions  Medication Sig Dispense Refill  . amLODipine (NORVASC) 10 MG tablet Take 10 mg by mouth daily.      . Calcium Carbonate (CALCIUM 600 PO) Take 600 mg by mouth daily.      . Coenzyme Q10 (CO Q 10) 100 MG CAPS Take 100 mg by mouth daily.      . DiphenhydrAMINE HCl (BENADRYL ALLERGY PO) Take by mouth.      . fish oil-omega-3 fatty acids 1000 MG capsule Take 1 g by mouth daily.      . naproxen sodium (ANAPROX) 220 MG tablet Take 220 mg by mouth daily.       No current facility-administered medications for this visit.    Discontinued Meds:    Medications Discontinued During This Encounter  Medication Reason  . Cholecalciferol (VITAMIN D-3) 1000 UNITS CAPS Error    Patient Active Problem List   Diagnosis Date Noted  . Second degree heart block 05/27/2011  . Essential hypertension, benign 05/27/2011  . Mixed hyperlipidemia 05/27/2011    LABS    Component Value Date/Time   NA 142 03/03/2012 1004   NA 142 02/27/2012 1340   NA 135 10/19/2010 0500   K 5.4* 03/03/2012 1004   K 5.5* 02/27/2012 1340   K 3.6 10/19/2010 0500   CL 104 03/03/2012 1004   CL 106 02/27/2012 1340   CL 101 10/19/2010 0500   CO2 29 03/03/2012 1004   CO2 29 02/27/2012 1340   CO2 29 10/19/2010 0500   GLUCOSE 90 03/03/2012 1004   GLUCOSE 95 02/27/2012 1340   GLUCOSE 120* 10/19/2010 0500   BUN 23 03/03/2012 1004   BUN 18 02/27/2012 1340   BUN 12 10/19/2010 0500   CREATININE 0.82 03/03/2012 1004   CREATININE 0.94 02/27/2012 1340   CREATININE 0.55 10/19/2010 0500   CREATININE 0.55 10/18/2010 0448   CREATININE 0.65 10/10/2010 1125   CALCIUM 10.6* 03/03/2012 1004   CALCIUM 10.2 02/27/2012 1340   CALCIUM 9.0 10/19/2010 0500   GFRNONAA >60 10/19/2010 0500   GFRNONAA >60 10/18/2010 0448   GFRNONAA >60 10/10/2010 1125   GFRAA >60 10/19/2010 0500   GFRAA >60 10/18/2010 0448   GFRAA >60 10/10/2010 1125   CMP     Component Value Date/Time   NA 142 03/03/2012 1004   K 5.4* 03/03/2012 1004   CL 104 03/03/2012 1004   CO2 29 03/03/2012 1004   GLUCOSE 90 03/03/2012 1004   BUN 23 03/03/2012 1004   CREATININE 0.82 03/03/2012 1004   CREATININE 0.55 10/19/2010 0500   CALCIUM 10.6*  03/03/2012 1004   PROT 7.3 10/10/2010 1125   ALBUMIN 4.2 10/10/2010 1125   AST 19 10/10/2010 1125   ALT 17 10/10/2010 1125   ALKPHOS 63 10/10/2010 1125   BILITOT 1.0 10/10/2010 1125   GFRNONAA >60 10/19/2010 0500   GFRAA >60 10/19/2010 0500       Component Value Date/Time   WBC 9.0 10/20/2010 0434   WBC 8.8 10/19/2010 0500   WBC 9.1 10/18/2010 0448   HGB 8.1* 10/20/2010 0434   HGB 8.8* 10/19/2010 0500   HGB 9.7* 10/18/2010 0448   HCT 22.2* 10/20/2010 0434   HCT 25.0* 10/19/2010 0500   HCT 26.6* 10/18/2010 0448   MCV 86.7 10/20/2010 0434   MCV 88.7 10/19/2010 0500   MCV 87.8 10/18/2010 0448    Lipid Panel  No results found for this basename: chol, trig, hdl, cholhdl, vldl, ldlcalc    ABG No results found for this basename: phart, pco2, pco2art, po2, po2art, hco3, tco2, acidbasedef, o2sat     No results found for this basename: TSH   BNP (last 3 results) No results found for this basename: PROBNP,  in the last 8760 hours Cardiac Panel (last 3 results) No results found for this basename: CKTOTAL, CKMB,  TROPONINI, RELINDX,  in the last 72 hours  Iron/TIBC/Ferritin/ %Sat No results found for this basename: iron, tibc, ferritin, ironpctsat     EKG Orders placed in visit on 09/02/12  . EKG 12-LEAD     Prior Assessment and Plan Problem List as of 10/18/2013     Cardiovascular and Mediastinum   Second degree heart block   Last Assessment & Plan   03/08/2013 Office Visit Written 03/08/2013 11:31 AM by Satira Sark, MD     Quiescent, plan will be annual followup going forward unless she develops any symptoms or significant bradycardia.    Essential hypertension, benign   Last Assessment & Plan   03/08/2013 Office Visit Written 03/08/2013 11:31 AM by Satira Sark, MD     No change to current regimen, keep followup with Dr. [name].      Other   Mixed hyperlipidemia   Last Assessment & Plan   02/27/2012 Office Visit Written 02/27/2012  2:48 PM by Lendon Colonel, NP     Followed by Dr.Hawkins.        Imaging: No results found.

## 2013-10-18 NOTE — Assessment & Plan Note (Signed)
Continues hypertensive on this visit. She is on amlodipine 10 mg daily. She may benefit from ARB or ACE but will wait for cardiac monitor to reveal her rhythm before making medication changes.

## 2013-10-18 NOTE — Assessment & Plan Note (Signed)
EKG confirms this with rate of 42. She brings with her HR and BP measurements with avg HR 42-45. BP is elevated in the 811'W systolic which may be indicative of bradycardia. I have reviewed EKG with Dr. Domenic Polite on site. Will place cardiac monitor 7-day for evaluation of duration, pauses, and avg HR. She may need to have pacemaker as she is symptomatic with fatigue. She is not having dyspnea or dizziness or near syncope. TSH will also be checked.

## 2013-10-18 NOTE — Patient Instructions (Addendum)
Your physician recommends that you schedule a follow-up appointment in: 2-3 weeks with Dr.McDowell   Your physician has recommended that you wear an event monitor for 7 days. Event monitors are medical devices that record the heart's electrical activity. Doctors most often [place] these monitors to diagnose arrhythmias. Arrhythmias are problems with the speed or rhythm of the heartbeat. The monitor is a small, portable device. You can wear one while you do your normal daily activities. This is usually used to diagnose what is causing palpitations/syncope (passing out).     Please get lab work (TSH)     Thank you for choosing [facility] !

## 2013-10-18 NOTE — Progress Notes (Signed)
    HPI: Laura Martin is a 80 year old patient of Dr. Domenic Polite were following for ongoing assessment and management of second degree heart block that resolved when taken off the Cardizem. He was last seen in the office in December 2014. He is also being treated for hypertension. She is here for followup.  She comes today feeling fatigued. She brings with her a copy of BP and HR readings since April of 2015. Avg HR is 42 bpm with highest at 62 bpm, and lowest of 34 bpm. She states that she is not having chest pain, or DOE. She sometimes feels HR palpate, or hiccup/pound. She denies dizziness or near syncope.   She states that in the past she had a thyroid goiter, but this was never removed. She has not had thyroid checked "in years."   Allergies  Allergen Reactions  . Codeine   . Sulfa Antibiotics   . Tramadol     Current Outpatient Prescriptions  Medication Sig Dispense Refill  . amLODipine (NORVASC) 10 MG tablet Take 10 mg by mouth daily.      . Calcium Carbonate (CALCIUM 600 PO) Take 600 mg by mouth daily.      . Coenzyme Q10 (CO Q 10) 100 MG CAPS Take 100 mg by mouth daily.      . DiphenhydrAMINE HCl (BENADRYL ALLERGY PO) Take by mouth.      . fish oil-omega-3 fatty acids 1000 MG capsule Take 1 g by mouth daily.      . naproxen sodium (ANAPROX) 220 MG tablet Take 220 mg by mouth daily.       No current facility-administered medications for this visit.    Past Medical History  Diagnosis Date  . Goiter   . Mixed hyperlipidemia   . Essential hypertension, benign   . Arthritis   . Spinal stenosis   . Second degree heart block     Past Surgical History  Procedure Laterality Date  . Breast biopsy    . Biopsy thyroid    . Left tkr      ROS: Review of systems complete and found to be negative unless listed above  PHYSICAL EXAM BP 168/62  Pulse 49  Ht 5\' 7"  (1.702 m)  Wt 154 lb (69.854 kg)  BMI 24.11 kg/m2 General: Well developed, well nourished, in no acute  distress Head: Eyes PERRLA, No xanthomas.   Normal cephalic and atramatic  Lungs: Clear bilaterally to auscultation and percussion.Bradycardic  Heart: HRRR S1 S2, without MRG.  Pulses are 2+ & equal.            No carotid bruit. No JVD.  No abdominal bruits. No femoral bruits. Abdomen: Bowel sounds are positive, abdomen soft and non-tender without masses or                  Hernia's noted. Msk:  Back normal, normal gait. Normal strength and tone for age. Extremities: No clubbing, cyanosis or edema.  DP +1 Neuro: Alert and oriented X 3. Psych:  Good affect, responds appropriately   EKG: Second degree AV block, rate of 42 bpm.   ASSESSMENT AND PLAN

## 2013-10-19 LAB — TSH: TSH: 1.247 u[IU]/mL (ref 0.350–4.500)

## 2013-10-20 ENCOUNTER — Telehealth: Payer: Self-pay | Admitting: *Deleted

## 2013-10-20 NOTE — Telephone Encounter (Signed)
Message copied by Desma Mcgregor on Thu Oct 20, 2013  1:11 PM ------      Message from: Lendon Colonel      Created: Thu Oct 20, 2013 12:41 PM       TSH normal. Will review cardiac monitor for second degree heart block. ------

## 2013-10-20 NOTE — Telephone Encounter (Signed)
Notified pt of results. Pt understood

## 2013-10-21 ENCOUNTER — Other Ambulatory Visit: Payer: Self-pay | Admitting: *Deleted

## 2013-10-21 DIAGNOSIS — R001 Bradycardia, unspecified: Secondary | ICD-10-CM

## 2013-11-08 ENCOUNTER — Ambulatory Visit: Payer: 59 | Admitting: Cardiology

## 2013-11-11 ENCOUNTER — Ambulatory Visit (INDEPENDENT_AMBULATORY_CARE_PROVIDER_SITE_OTHER): Payer: Medicare Other | Admitting: Cardiology

## 2013-11-11 ENCOUNTER — Encounter: Payer: Self-pay | Admitting: Cardiology

## 2013-11-11 VITALS — BP 162/82 | HR 58 | Ht 67.0 in | Wt 153.0 lb

## 2013-11-11 DIAGNOSIS — I10 Essential (primary) hypertension: Secondary | ICD-10-CM | POA: Diagnosis not present

## 2013-11-11 DIAGNOSIS — R011 Cardiac murmur, unspecified: Secondary | ICD-10-CM | POA: Insufficient documentation

## 2013-11-11 DIAGNOSIS — I441 Atrioventricular block, second degree: Secondary | ICD-10-CM | POA: Diagnosis not present

## 2013-11-11 NOTE — Assessment & Plan Note (Signed)
Recurring, as outlined above. Potentially related to at least some of her exertional fatigue. I have scheduled her to see Dr. Lovena Le for EP consultation regarding appropriateness for pacemaker placement. Most recent ECGs and cardiac monitor are loaded n Epic.

## 2013-11-11 NOTE — Patient Instructions (Signed)
Your physician recommends that you schedule a follow-up appointment in: 3 months   Your physician recommends that you continue on your current medications as directed. Please refer to the Current Medication list given to you today.    Your physician has requested that you have an echocardiogram. Echocardiography is a painless test that uses sound waves to create images of your heart. It provides your doctor with information about the size and shape of your heart and how well your heart's chambers and valves are working. This procedure takes approximately one hour. There are no restrictions for this procedure.      Thank you for choosing [facility] !

## 2013-11-11 NOTE — Assessment & Plan Note (Signed)
She has not had a recent echocardiogram, this will be arranged to assess LV function and valvular status.

## 2013-11-11 NOTE — Progress Notes (Signed)
    Clinical Summary Laura Martin is an 80 y.o.female seen recently in the office by Ms. Lawrence NP in late July. She has a history of transient second-degree heart block while on Cardizem in the past, resolved with discontinuation of the medication. Since that time she has been followed conservatively. At the most recent visit she reviewed heart rate recordings documenting rates down in the 40s at times. ECG showed sinus rhythm with 2:1 heart block, rate 42 beats per minute. She did report some fatigue but no syncope.  Followup cardiac monitor was obtained demonstrating sinus rhythm with episodic second-degree type I and type II heart block, no prolonged pauses. Visit has already been scheduled with Laura Martin for EP consultation.  She is here with her husband today. Still describes generalized fatigue with activity, no dizziness or syncope, no chest pain.   Allergies  Allergen Reactions  . Codeine   . Sulfa Antibiotics   . Tramadol     Current Outpatient Prescriptions  Medication Sig Dispense Refill  . amLODipine (NORVASC) 10 MG tablet Take 10 mg by mouth daily.      . Calcium Carbonate (CALCIUM 600 PO) Take 600 mg by mouth daily.      . Coenzyme Q10 (CO Q 10) 100 MG CAPS Take 100 mg by mouth daily.      . DiphenhydrAMINE HCl (BENADRYL ALLERGY PO) Take by mouth.      . fish oil-omega-3 fatty acids 1000 MG capsule Take 1 g by mouth daily.      . naproxen sodium (ANAPROX) 220 MG tablet Take 220 mg by mouth daily.       No current facility-administered medications for this visit.    Past Medical History  Diagnosis Date  . Goiter   . Mixed hyperlipidemia   . Essential hypertension, benign   . Arthritis   . Spinal stenosis   . Second degree heart block     Social History Laura Martin reports that she has never smoked. She has never used smokeless tobacco. Laura Martin reports that she does not drink alcohol.  Review of Systems Other systems reviewed and negative except as  outlined.  Physical Examination Filed Vitals:   11/11/13 0808  BP: 162/82  Pulse: 58   Filed Weights   11/11/13 0808  Weight: 153 lb (69.4 kg)    Normally nourished appearing, comfortable at rest. HEENT: Conjunctiva and lids normal, oropharynx clear with moist mucosa.  Neck: Supple, no elevated JVP or carotid bruits, no thyromegaly.  Lungs: Clear to auscultation, nonlabored breathing at rest.  Cardiac: Regular rate and rhythm, no S3, 1-6/1 systolic murmur, no pericardial rub.  Extremities: No pitting edema, distal pulses 1-2+.  Skin: Warm and dry. Muscular skeletal: No kyphosis. Neuropsychiatric: Alert and oriented x3, affect grossly appropriate.   Problem List and Plan   Second degree heart block Recurring, as outlined above. Potentially related to at least some of her exertional fatigue. I have scheduled her to see Laura Martin for EP consultation regarding appropriateness for pacemaker placement. Most recent ECGs and cardiac monitor are loaded n Epic.  Essential hypertension, benign For now, no change in current regimen.  Heart murmur She has not had a recent echocardiogram, this will be arranged to assess LV function and valvular status.    Satira Sark, M.D., F.A.C.C.

## 2013-11-11 NOTE — Assessment & Plan Note (Signed)
For now, no change in current regimen.

## 2013-11-16 ENCOUNTER — Ambulatory Visit (HOSPITAL_COMMUNITY)
Admission: RE | Admit: 2013-11-16 | Discharge: 2013-11-16 | Disposition: A | Discharge status: Home / Self Care | Payer: Medicare Other | Source: Ambulatory Visit | Attending: Cardiology | Admitting: Cardiology

## 2013-11-16 DIAGNOSIS — I10 Essential (primary) hypertension: Secondary | ICD-10-CM | POA: Insufficient documentation

## 2013-11-16 DIAGNOSIS — E785 Hyperlipidemia, unspecified: Secondary | ICD-10-CM | POA: Diagnosis not present

## 2013-11-16 DIAGNOSIS — I459 Conduction disorder, unspecified: Secondary | ICD-10-CM | POA: Diagnosis not present

## 2013-11-16 DIAGNOSIS — R011 Cardiac murmur, unspecified: Secondary | ICD-10-CM | POA: Diagnosis not present

## 2013-11-16 DIAGNOSIS — I379 Nonrheumatic pulmonary valve disorder, unspecified: Secondary | ICD-10-CM | POA: Diagnosis not present

## 2013-11-16 NOTE — Addendum Note (Signed)
Addended by: Hilarie Fredrickson T on: 11/16/2013 12:04 PM   Modules accepted: Orders

## 2013-11-16 NOTE — Progress Notes (Signed)
  Echocardiogram 2D Echocardiogram has been performed. Bubble study ordered in error.  Valier, Tripp 11/16/2013, 1:39 PM

## 2013-11-29 ENCOUNTER — Encounter: Payer: Self-pay | Admitting: Internal Medicine

## 2013-11-29 ENCOUNTER — Ambulatory Visit (INDEPENDENT_AMBULATORY_CARE_PROVIDER_SITE_OTHER): Payer: Medicare Other | Admitting: Internal Medicine

## 2013-11-29 ENCOUNTER — Encounter: Payer: Self-pay | Admitting: *Deleted

## 2013-11-29 VITALS — BP 176/58 | HR 46 | Ht 67.0 in | Wt 152.4 lb

## 2013-11-29 DIAGNOSIS — R001 Bradycardia, unspecified: Secondary | ICD-10-CM

## 2013-11-29 DIAGNOSIS — I10 Essential (primary) hypertension: Secondary | ICD-10-CM | POA: Diagnosis not present

## 2013-11-29 DIAGNOSIS — I441 Atrioventricular block, second degree: Secondary | ICD-10-CM | POA: Diagnosis not present

## 2013-11-29 DIAGNOSIS — I498 Other specified cardiac arrhythmias: Secondary | ICD-10-CM

## 2013-11-29 NOTE — Progress Notes (Signed)
      HPI [name] is referred today by Dr. Domenic Polite for consideration for PPM insertion. She is a very pleasant 80 yo woman with periods of fatigue and weakness. She has never had frank syncope. She was on a beta blocker but this was stopped after she began having symptomatic bradycardia. She has had persistent symptoms and found on cardiac monitoring to have daytime periods of high grade AV block with 2:1 heart block in the setting of RBBB. No syncope. She does have fatigue and weakness.  Allergies  Allergen Reactions  . Codeine Nausea And Vomiting  . Tramadol Nausea And Vomiting  . Sulfa Antibiotics Rash     Current Outpatient Prescriptions  Medication Sig Dispense Refill  . amLODipine (NORVASC) 10 MG tablet Take 10 mg by mouth daily.      . Calcium Carbonate (CALCIUM 600 PO) Take 600 mg by mouth daily.      . Coenzyme Q10 (CO Q 10) 100 MG CAPS Take 100 mg by mouth daily.      . fish oil-omega-3 fatty acids 1000 MG capsule Take 1 g by mouth daily.      . naproxen sodium (ANAPROX) 220 MG tablet Take 220 mg by mouth daily.       No current facility-administered medications for this visit.     Past Medical History  Diagnosis Date  . Goiter   . Mixed hyperlipidemia   . Essential hypertension, benign   . Arthritis   . Spinal stenosis   . Second degree heart block     ROS:   All systems reviewed and negative except as noted in the HPI.   Past Surgical History  Procedure Laterality Date  . Breast biopsy    . Biopsy thyroid    . Left tkr       Family History  Problem Relation Age of Onset  . Cancer    . Stroke       History   Social History  . Marital Status: Married    Spouse Name: N/A    Number of Children: N/A  . Years of Education: N/A   Occupational History  . Not on file.   Social History Main Topics  . Smoking status: Never Smoker   . Smokeless tobacco: Never Used  . Alcohol Use: No  . Drug Use: No  . Sexual Activity: Not on file   Other  Topics Concern  . Not on file   Social History Narrative  . No narrative on file     BP 176/58  Pulse 46  Ht 5\' 7"  (1.702 m)  Wt 152 lb 6.4 oz (69.128 kg)  BMI 23.86 kg/m2  Physical Exam:  Well appearing NAD HEENT: Unremarkable Neck:  No JVD, no thyromegally Lymphatics:  No adenopathy Back:  No CVA tenderness Lungs:  Clear with no wheezes HEART:  Regular brady rhythm, no murmurs, no rubs, no clicks Abd:  soft, positive bowel sounds, no organomegally, no rebound, no guarding Ext:  2 plus pulses, no edema, no cyanosis, no clubbing Skin:  No rashes no nodules Neuro:  CN II through XII intact, motor grossly intact  Cardiac monitor - 2:1 AV block  Assess/Plan:

## 2013-11-29 NOTE — Assessment & Plan Note (Signed)
She appears to have symptomatic 2:1 heart block. I have discussed the treatment options with the patient and she wishes to proceed with PPM insertion. The risk/benefit/goals/expectations of the procedure have been discussed with the patient and she wishes to proceed.

## 2013-11-29 NOTE — Patient Instructions (Addendum)
Your physician has recommended that you have a pacemaker inserted. A pacemaker is a small device that is placed under the skin of your chest or abdomen to help control abnormal heart rhythms. This device uses electrical pulses to prompt the heart to beat at a normal rate. Pacemakers are used to treat heart rhythms that are too slow. Wire (leads) are attached to the pacemaker that goes into the chambers of you heart. This is done in the hospital and usually requires and overnight stay. Please see the instruction sheet given to you today for more information.  See instrucution sheet for procedure  Your physician recommends that you schedule a follow-up appointment in: device clinic for wound check 01/09/14

## 2013-11-29 NOTE — Assessment & Plan Note (Signed)
Her blood pressure is high today. Once she has had her PPM placed, will plan on uptitrating/restarting her AV nodal blocking drugs.

## 2013-12-23 ENCOUNTER — Encounter (HOSPITAL_COMMUNITY): Payer: Self-pay | Admitting: Pharmacy Technician

## 2013-12-23 ENCOUNTER — Other Ambulatory Visit: Payer: Self-pay | Admitting: Internal Medicine

## 2013-12-23 DIAGNOSIS — I498 Other specified cardiac arrhythmias: Secondary | ICD-10-CM | POA: Diagnosis not present

## 2013-12-23 LAB — CBC
HCT: 37.5 % (ref 36.0–46.0)
Hemoglobin: 13.2 g/dL (ref 12.0–15.0)
MCH: 30.3 pg (ref 26.0–34.0)
MCHC: 35.2 g/dL (ref 30.0–36.0)
MCV: 86 fL (ref 78.0–100.0)
PLATELETS: 251 10*3/uL (ref 150–400)
RBC: 4.36 MIL/uL (ref 3.87–5.11)
RDW: 13.6 % (ref 11.5–15.5)
WBC: 7.4 10*3/uL (ref 4.0–10.5)

## 2013-12-23 LAB — BASIC METABOLIC PANEL
BUN: 20 mg/dL (ref 6–23)
CALCIUM: 9.7 mg/dL (ref 8.4–10.5)
CO2: 27 mEq/L (ref 19–32)
CREATININE: 0.79 mg/dL (ref 0.50–1.10)
Chloride: 103 mEq/L (ref 96–112)
Glucose, Bld: 99 mg/dL (ref 70–99)
Potassium: 4.2 mEq/L (ref 3.5–5.3)
Sodium: 138 mEq/L (ref 135–145)

## 2013-12-29 DIAGNOSIS — R011 Cardiac murmur, unspecified: Secondary | ICD-10-CM | POA: Diagnosis not present

## 2013-12-29 DIAGNOSIS — I441 Atrioventricular block, second degree: Secondary | ICD-10-CM | POA: Diagnosis not present

## 2013-12-29 DIAGNOSIS — I10 Essential (primary) hypertension: Secondary | ICD-10-CM | POA: Diagnosis not present

## 2013-12-29 DIAGNOSIS — Z79899 Other long term (current) drug therapy: Secondary | ICD-10-CM | POA: Diagnosis not present

## 2013-12-29 DIAGNOSIS — E782 Mixed hyperlipidemia: Secondary | ICD-10-CM | POA: Diagnosis not present

## 2013-12-29 MED ORDER — SODIUM CHLORIDE 0.9 % IR SOLN
80.0000 mg | Status: DC
  Filled 2013-12-29: qty 2

## 2013-12-29 MED ORDER — CEFAZOLIN SODIUM-DEXTROSE 2-3 GM-% IV SOLR: 2.0000 g | INTRAVENOUS | Status: DC

## 2013-12-30 ENCOUNTER — Ambulatory Visit (HOSPITAL_COMMUNITY)
Admission: RE | Admit: 2013-12-30 | Discharge: 2014-01-01 | Disposition: A | Discharge status: Home / Self Care | Payer: Medicare Other | Source: Ambulatory Visit | Attending: Internal Medicine | Admitting: Internal Medicine

## 2013-12-30 ENCOUNTER — Encounter (HOSPITAL_COMMUNITY)
Admission: RE | Disposition: A | Discharge status: Home / Self Care | Payer: Self-pay | Source: Ambulatory Visit | Attending: Internal Medicine

## 2013-12-30 ENCOUNTER — Encounter (HOSPITAL_COMMUNITY): Payer: Self-pay | Admitting: General Practice

## 2013-12-30 ENCOUNTER — Ambulatory Visit (HOSPITAL_COMMUNITY): Payer: Medicare Other

## 2013-12-30 DIAGNOSIS — R011 Cardiac murmur, unspecified: Secondary | ICD-10-CM | POA: Diagnosis not present

## 2013-12-30 DIAGNOSIS — I10 Essential (primary) hypertension: Secondary | ICD-10-CM | POA: Diagnosis not present

## 2013-12-30 DIAGNOSIS — R079 Chest pain, unspecified: Secondary | ICD-10-CM | POA: Diagnosis not present

## 2013-12-30 DIAGNOSIS — E782 Mixed hyperlipidemia: Secondary | ICD-10-CM | POA: Diagnosis not present

## 2013-12-30 DIAGNOSIS — Z79899 Other long term (current) drug therapy: Secondary | ICD-10-CM | POA: Diagnosis not present

## 2013-12-30 DIAGNOSIS — R001 Bradycardia, unspecified: Secondary | ICD-10-CM

## 2013-12-30 DIAGNOSIS — I442 Atrioventricular block, complete: Secondary | ICD-10-CM

## 2013-12-30 DIAGNOSIS — I441 Atrioventricular block, second degree: Secondary | ICD-10-CM | POA: Diagnosis not present

## 2013-12-30 HISTORY — PX: PERMANENT PACEMAKER INSERTION: SHX5480

## 2013-12-30 HISTORY — DX: Atrioventricular block, complete: I44.2

## 2013-12-30 HISTORY — DX: Nontoxic single thyroid nodule: E04.1

## 2013-12-30 HISTORY — PX: INSERT / REPLACE / REMOVE PACEMAKER: SUR710

## 2013-12-30 LAB — SURGICAL PCR SCREEN
MRSA, PCR: NEGATIVE
Staphylococcus aureus: NEGATIVE

## 2013-12-30 SURGERY — PERMANENT PACEMAKER INSERTION
Anesthesia: LOCAL

## 2013-12-30 MED ORDER — CEFAZOLIN SODIUM 1-5 GM-% IV SOLN
1.0000 g | Freq: Four times a day (QID) | INTRAVENOUS | Status: AC
  Administered 2013-12-30 – 2013-12-31 (×3): 1 g via INTRAVENOUS
  Filled 2013-12-30 (×3): qty 50

## 2013-12-30 MED ORDER — ZOLPIDEM TARTRATE 5 MG PO TABS: 5.0000 mg | ORAL_TABLET | Freq: Every evening | ORAL | Status: DC | PRN

## 2013-12-30 MED ORDER — AMLODIPINE BESYLATE 10 MG PO TABS
10.0000 mg | ORAL_TABLET | Freq: Every day | ORAL | Status: DC
  Administered 2013-12-31 – 2014-01-01 (×2): 10 mg via ORAL
  Filled 2013-12-30 (×2): qty 1

## 2013-12-30 MED ORDER — ONDANSETRON HCL 4 MG/2ML IJ SOLN
4.0000 mg | Freq: Four times a day (QID) | INTRAMUSCULAR | Status: DC | PRN
  Administered 2013-12-31: 4 mg via INTRAVENOUS
  Filled 2013-12-30: qty 2

## 2013-12-30 MED ORDER — DIPHENHYDRAMINE HCL 25 MG PO TABS: 25.0000 mg | ORAL_TABLET | Freq: Every evening | ORAL | Status: DC | PRN

## 2013-12-30 MED ORDER — MUPIROCIN 2 % EX OINT
TOPICAL_OINTMENT | CUTANEOUS | Status: AC
  Filled 2013-12-30: qty 22

## 2013-12-30 MED ORDER — LIDOCAINE HCL (PF) 1 % IJ SOLN
INTRAMUSCULAR | Status: DC
Start: 2013-12-30 — End: 2013-12-30
  Filled 2013-12-30: qty 60

## 2013-12-30 MED ORDER — MIDAZOLAM HCL 5 MG/5ML IJ SOLN
INTRAMUSCULAR | Status: AC
  Filled 2013-12-30: qty 5

## 2013-12-30 MED ORDER — SODIUM CHLORIDE 0.9 % IV SOLN
INTRAVENOUS | Status: DC
  Administered 2013-12-30: 06:00:00 via INTRAVENOUS

## 2013-12-30 MED ORDER — LIDOCAINE HCL (PF) 1 % IJ SOLN
INTRAMUSCULAR | Status: AC
  Filled 2013-12-30: qty 30

## 2013-12-30 MED ORDER — CEFAZOLIN SODIUM-DEXTROSE 2-3 GM-% IV SOLR
INTRAVENOUS | Status: AC
  Filled 2013-12-30: qty 50

## 2013-12-30 MED ORDER — MUPIROCIN 2 % EX OINT
TOPICAL_OINTMENT | Freq: Two times a day (BID) | CUTANEOUS | Status: DC
  Filled 2013-12-30: qty 22

## 2013-12-30 MED ORDER — ACETAMINOPHEN 325 MG PO TABS
325.0000 mg | ORAL_TABLET | ORAL | Status: DC | PRN
  Administered 2013-12-30 – 2013-12-31 (×4): 650 mg via ORAL
  Filled 2013-12-30 (×4): qty 2

## 2013-12-30 MED ORDER — HEPARIN (PORCINE) IN NACL 2-0.9 UNIT/ML-% IJ SOLN
INTRAMUSCULAR | Status: AC
  Filled 2013-12-30: qty 500

## 2013-12-30 MED ORDER — FENTANYL CITRATE 0.05 MG/ML IJ SOLN
INTRAMUSCULAR | Status: AC
  Filled 2013-12-30: qty 2

## 2013-12-30 MED ORDER — MUPIROCIN 2 % EX OINT
1.0000 "application " | TOPICAL_OINTMENT | Freq: Once | CUTANEOUS | Status: AC
  Administered 2013-12-30: 1 via TOPICAL
  Filled 2013-12-30: qty 22

## 2013-12-30 MED ORDER — CHLORHEXIDINE GLUCONATE 4 % EX LIQD
60.0000 mL | Freq: Once | CUTANEOUS | Status: DC
  Filled 2013-12-30: qty 60

## 2013-12-30 NOTE — Progress Notes (Signed)
Chest pain with moving after pacemaker placement. Will order portable chest x ray and follow with result.  Hilbert Corrigan PA Pager: [phone]

## 2013-12-30 NOTE — Progress Notes (Signed)
UR Completed Huel Centola Graves-Bigelow, RN,BSN [phone]  

## 2013-12-30 NOTE — H&P (Signed)
HPI Laura Martin is referred today by Dr. Domenic Polite for consideration for PPM insertion. She is a very pleasant 80 yo woman with periods of fatigue and weakness. She has never had frank syncope. She was on a beta blocker but this was stopped after she began having symptomatic bradycardia. She has had persistent symptoms and found on cardiac monitoring to have daytime periods of high grade AV block with 2:1 heart block in the setting of RBBB. No syncope. She does have fatigue and weakness.   Allergies   Allergen  Reactions   .  Codeine  Nausea And Vomiting   .  Tramadol  Nausea And Vomiting   .  Sulfa Antibiotics  Rash           Current Outpatient Prescriptions   Medication  Sig  Dispense  Refill   .  amLODipine (NORVASC) 10 MG tablet  Take 10 mg by mouth daily.         .  Calcium Carbonate (CALCIUM 600 PO)  Take 600 mg by mouth daily.         .  Coenzyme Q10 (CO Q 10) 100 MG CAPS  Take 100 mg by mouth daily.         .  fish oil-omega-3 fatty acids 1000 MG capsule  Take 1 g by mouth daily.         .  naproxen sodium (ANAPROX) 220 MG tablet  Take 220 mg by mouth daily.             No current facility-administered medications for this visit.           Past Medical History   Diagnosis  Date   .  Goiter     .  Mixed hyperlipidemia     .  Essential hypertension, benign     .  Arthritis     .  Spinal stenosis     .  Second degree heart block          ROS:    All systems reviewed and negative except as noted in the HPI.      Past Surgical History   Procedure  Laterality  Date   .  Breast biopsy       .  Biopsy thyroid       .  Left tkr               Family History   Problem  Relation  Age of Onset   .  Cancer       .  Stroke               History       Social History   .  Marital Status:  Married       Spouse Name:  N/A       Number of Children:  N/A   .  Years of Education:  N/A       Occupational History   .  Not on file.       Social  History Main Topics   .  Smoking status:  Never Smoker    .  Smokeless tobacco:  Never Used   .  Alcohol Use:  No   .  Drug Use:  No   .  Sexual Activity:  Not on file       Other Topics  Concern   .  Not on file       Social History Narrative   .  No narrative on file          BP 176/58  Pulse 46  Ht 5\' 7"  (1.702 m)  Wt 152 lb 6.4 oz (69.128 kg)  BMI 23.86 kg/m2   Physical Exam:   Well appearing NAD HEENT: Unremarkable Neck:  No JVD, no thyromegally Lymphatics:  No adenopathy Back:  No CVA tenderness Lungs:  Clear with no wheezes HEART:  Regular brady rhythm, no murmurs, no rubs, no clicks Abd:  soft, positive bowel sounds, no organomegally, no rebound, no guarding Ext:  2 plus pulses, no edema, no cyanosis, no clubbing Skin:  No rashes no nodules Neuro:  CN II through XII intact, motor grossly intact   Cardiac monitor - 2:1 AV block   Assess/Plan:         Second degree heart block -  She appears to have symptomatic 2:1 heart block. I have discussed the treatment options with the patient and she wishes to proceed with PPM insertion. The risk/benefit/goals/expectations of the procedure have been discussed with the patient and she wishes to proceed.         Essential hypertension, benign - Her blood pressure is high today. Once she has had her PPM placed, will plan on uptitrating/restarting her AV nodal blocking drugs.   Mikle Bosworth.D.

## 2013-12-30 NOTE — CV Procedure (Signed)
SURGEON:  Cristopher Peru, MD     PREPROCEDURE DIAGNOSIS:  Symptomatic Bradycardia due to Mobitz 2, second degree AV block    POSTPROCEDURE DIAGNOSIS:  Same as preprocedure diagnosis     PROCEDURES:   1. Pacemaker implantation.     INTRODUCTION: Laura Martin is a 80 y.o. female  with a history of bradycardia who presents today for pacemaker implantation.  The patient reports intermittent episodes of dizziness over the past few months.  No reversible causes have been identified.  The patient therefore presents today for pacemaker implantation.     DESCRIPTION OF PROCEDURE:  Informed written consent was obtained, and   the patient was brought to the electrophysiology lab in a fasting state.  The patient required no sedation for the procedure today.  The patients left chest was prepped and draped in the usual sterile fashion by the EP lab staff. The skin overlying the left deltopectoral region was infiltrated with lidocaine for local analgesia.  A 4-cm incision was made over the left deltopectoral region.  A left subcutaneous pacemaker pocket was fashioned using a combination of sharp and blunt dissection. Electrocautery was required to assure hemostasis.    RA/RV Lead Placement: The left axillary vein was therefore directly visualized and cannulated.  Through the left axillary vein, a Medtronic [id number] (serial number [id number]) right atrial lead and a Medtronic 5076 (serial number [id number]) right ventricular lead were advanced with fluoroscopic visualization into the right atrial appendage and right ventricular apical septal positions respectively.  Initial atrial lead P- waves measured 2 mV with impedance of 659 ohms and a threshold of 1 V at 0.5 msec.  Right ventricular lead R-waves measured 15 mV with an impedance of 952 ohms and a threshold of 1 V at 0.5 msec.  Both leads were secured to the pectoralis fascia using #2-0 silk over the suture sleeves.   Device Placement:  The leads were then  connected to a Medtronic Adapta L (serial number [id number]) pacemaker.  The pocket was irrigated with copious gentamicin solution.  The pacemaker was then placed into the pocket.  The pocket was then closed in 2 layers with 2.0 Vicryl suture for the subcutaneous and subcuticular layers.  Steri-Strips and a sterile dressing were then applied.  There were no early apparent complications.     CONCLUSIONS:   1. Successful implantation of a medtronic dual-chamber pacemaker for symptomatic bradycardia due to symptomatic Mobitz 2, second degree AV block  2. No early apparent complications.           Cristopher Peru, MD 12/30/2013 8:33 AM

## 2013-12-31 ENCOUNTER — Ambulatory Visit (HOSPITAL_COMMUNITY): Payer: Medicare Other

## 2013-12-31 DIAGNOSIS — I442 Atrioventricular block, complete: Secondary | ICD-10-CM | POA: Diagnosis not present

## 2013-12-31 DIAGNOSIS — I10 Essential (primary) hypertension: Secondary | ICD-10-CM | POA: Diagnosis not present

## 2013-12-31 DIAGNOSIS — Z45018 Encounter for adjustment and management of other part of cardiac pacemaker: Secondary | ICD-10-CM | POA: Diagnosis not present

## 2013-12-31 DIAGNOSIS — Z79899 Other long term (current) drug therapy: Secondary | ICD-10-CM | POA: Diagnosis not present

## 2013-12-31 DIAGNOSIS — I441 Atrioventricular block, second degree: Secondary | ICD-10-CM | POA: Diagnosis not present

## 2013-12-31 DIAGNOSIS — E782 Mixed hyperlipidemia: Secondary | ICD-10-CM | POA: Diagnosis not present

## 2013-12-31 DIAGNOSIS — J9811 Atelectasis: Secondary | ICD-10-CM | POA: Diagnosis not present

## 2013-12-31 DIAGNOSIS — R011 Cardiac murmur, unspecified: Secondary | ICD-10-CM | POA: Diagnosis not present

## 2013-12-31 MED ORDER — HYDROMORPHONE HCL 1 MG/ML IJ SOLN
0.2000 mg | Freq: Once | INTRAMUSCULAR | Status: AC
  Administered 2013-12-31: 0.2 mg via INTRAVENOUS
  Filled 2013-12-31: qty 1

## 2013-12-31 NOTE — Progress Notes (Addendum)
   SUBJECTIVE: Pt with sharp pleuritic chest pain over night.  She has had some neck aching as well.  Denies SOB except that it hurts at times to breath.  Her pain has much improved over the early morning.  She is presently pain free and sleeping.  Marland Kitchen amLODipine  10 mg Oral Daily      OBJECTIVE: Physical Exam: Filed Vitals:   12/30/13 2217 12/31/13 0013 12/31/13 0206 12/31/13 0401  BP: 158/60 177/69 174/52 132/49  Pulse: 69 69 62 59  Temp:  98.3 F (36.8 C)  97.6 F (36.4 C)  TempSrc:  Oral  Oral  Resp:  20    Height:      Weight:    154 lb 4.8 oz (69.99 kg)  SpO2: 98% 96% 96% 92%    Intake/Output Summary (Last 24 hours) at 12/31/13 0902 Last data filed at 12/31/13 0251  Gross per 24 hour  Intake    260 ml  Output    900 ml  Net   -640 ml    Telemetry reveals sinus rhythm with V pacing  GEN- The patient is elderly appearing, alert and oriented x 3 today.   Head- normocephalic, atraumatic Eyes-  Sclera clear, conjunctiva pink Ears- hearing intact Oropharynx- clear Neck- supple,  Lungs- decreased BS at the L base, normal work of breathing Heart- Regular rate and rhythm, no murmurs, rubs or gallops, PMI not laterally displaced GI- soft, NT, ND, + BS Extremities- no clubbing, cyanosis, or edema Skin- pacemaker pocket is without hematoma  Limited bedside echo by me this am reveal trivial pericardial effusion without hemodynamic compromise CXR last pm reviewed, Xray this am ordered by me Device interrogation is reviewed and normal  RADIOLOGY: Dg Chest Port 1 View  12/30/2013   CLINICAL DATA:  Left chest pain which radiates across the chest.  EXAM: PORTABLE CHEST - 1 VIEW  COMPARISON:  October 10, 2010  FINDINGS: The heart size is enlarged. Dual lead cardiac pacemaker is identified with the tips over the right atrium and right ventricle. The aorta is tortuous. The mediastinal contour is normal. Both lungs are clear. The visualized skeletal structures are stable.  IMPRESSION:  No active cardiopulmonary disease.   Electronically Signed   By: Abelardo Diesel M.D.   On: 12/30/2013 19:14    ASSESSMENT AND PLAN:  Active Problems:   Mobitz type 2 second degree AV block 1. Complete heart block S/p PPM Device interrogation is reviewed and normal cxr is pending  2. Chest pain this am Concerning for lead microperforation Echo this am reveals no significant effusion and patient is hemodynamically stable She feels that her pain is improving.  She would prefer to continue conservative management presently rather than lead revision.  We will follow closely over the weekend.  The On call cath lab supervisor is aware.  Hopefully, we can avoid urgent lead revision this weekend.  Will keep in hospital for additional observation at least another 24 hours.  Thompson Grayer, MD 12/31/2013 9:02 AM

## 2014-01-01 ENCOUNTER — Encounter (HOSPITAL_COMMUNITY): Payer: Self-pay | Admitting: Physician Assistant

## 2014-01-01 DIAGNOSIS — I10 Essential (primary) hypertension: Secondary | ICD-10-CM | POA: Diagnosis not present

## 2014-01-01 DIAGNOSIS — R011 Cardiac murmur, unspecified: Secondary | ICD-10-CM | POA: Diagnosis not present

## 2014-01-01 DIAGNOSIS — E782 Mixed hyperlipidemia: Secondary | ICD-10-CM | POA: Diagnosis not present

## 2014-01-01 DIAGNOSIS — I442 Atrioventricular block, complete: Secondary | ICD-10-CM

## 2014-01-01 DIAGNOSIS — Z79899 Other long term (current) drug therapy: Secondary | ICD-10-CM | POA: Diagnosis not present

## 2014-01-01 DIAGNOSIS — I441 Atrioventricular block, second degree: Secondary | ICD-10-CM | POA: Diagnosis not present

## 2014-01-01 NOTE — Progress Notes (Addendum)
   SUBJECTIVE: She has had complete resolution of discomfort since yesterday morning.  She was ambulatory throughout the day yesterday without any symptoms.  She feels that her symptoms were due to "gas" and her family admits that this is a frequent issue for her.  She denies any CP today and has no SOB or pain with inspiration.   She wants to go home.   Marland Kitchen amLODipine  10 mg Oral Daily      OBJECTIVE: Physical Exam: Filed Vitals:   12/31/13 0401 12/31/13 1355 12/31/13 2127 01/01/14 0550  BP: 132/49 133/41 148/95 140/58  Pulse: 59 66 78 79  Temp: 97.6 F (36.4 C) 98.6 F (37 C) 98.5 F (36.9 C) 98.7 F (37.1 C)  TempSrc: Oral Oral Oral Oral  Resp:  14 18 20   Height:      Weight: 154 lb 4.8 oz (69.99 kg)   151 lb 10.4 oz (68.788 kg)  SpO2: 92% 91% 95% 96%    Intake/Output Summary (Last 24 hours) at 01/01/14 3559 Last data filed at 01/01/14 7416  Gross per 24 hour  Intake    480 ml  Output      0 ml  Net    480 ml    Telemetry reveals sinus rhythm with V pacing  GEN- The patient is elderly appearing, alert and oriented x 3 today.   Head- normocephalic, atraumatic Eyes-  Sclera clear, conjunctiva pink Ears- hearing intact Oropharynx- clear Neck- supple,  Lungs- clear, normal work of breathing Heart- Regular rate and rhythm, no murmurs, rubs or gallops, PMI not laterally displaced GI- soft, NT, ND, + BS Extremities- no clubbing, cyanosis, or edema Skin- pacemaker pocket is without hematoma  CXR is reviewed and reveals stable leads, no ptx  RADIOLOGY: Dg Chest Port 1 View  12/30/2013   CLINICAL DATA:  Left chest pain which radiates across the chest.  EXAM: PORTABLE CHEST - 1 VIEW  COMPARISON:  October 10, 2010  FINDINGS: The heart size is enlarged. Dual lead cardiac pacemaker is identified with the tips over the right atrium and right ventricle. The aorta is tortuous. The mediastinal contour is normal. Both lungs are clear. The visualized skeletal structures are stable.   IMPRESSION: No active cardiopulmonary disease.   Electronically Signed   By: Abelardo Diesel M.D.   On: 12/30/2013 19:14    ASSESSMENT AND PLAN:  Active Problems:   Mobitz type 2 second degree AV block 1. Complete heart block S/p PPM Device interrogation is reviewed and normal cxr is normal  2. Chest pain- as this has completely resolved, perforation seems unlikely.  Lead interrogation and cxr were unrevealing.  Echo by me yesterday did not show effusion.  She has been hemodynamically stable and her symptoms are now resolved. I think that conservative management is best and this is her preference as well.  DC to home today.  Follow-up with the device clinic 10/19 as scheduled. Routine wound care.  Thompson Grayer, MD 01/01/2014 9:38 AM

## 2014-01-01 NOTE — Plan of Care (Signed)
Problem: Discharge Progression Outcomes Goal: Barriers To Progression Addressed/Resolved Outcome: Completed/Met Date Met:  01/01/14 PT NO LONGER HAVING CHEST PAIN AND RETURN TO BASELINE ACTIVITIES

## 2014-01-01 NOTE — Discharge Instructions (Signed)
° ° °  Supplemental Discharge Instructions for  Pacemaker/Defibrillator Patients  Activity No heavy lifting or vigorous activity with your left/right arm for 6 to 8 weeks.  Do not raise your left/right arm above your head for one week.  Gradually raise your affected arm as drawn below.              10/11                  10/12                    10/13                             10/14 __  NO DRIVING for 1 week      WOUND CARE   Keep the wound area clean and dry.  Do not get this area wet for one week. No showers for one week; you may shower on     .   The tape/steri-strips on your wound will fall off; do not pull them off.  No bandage is needed on the site.  DO  NOT apply any creams, oils, or ointments to the wound area.   If you notice any drainage or discharge from the wound, any swelling or bruising at the site, or you develop a fever > 101? F after you are discharged home, call the office at once.  Special Instructions   You are still able to use cellular telephones; use the ear opposite the side where you have your pacemaker/defibrillator.  Avoid carrying your cellular phone near your device.   When traveling through airports, show security personnel your identification card to avoid being screened in the metal detectors.  Ask the security personnel to use the hand wand.   Avoid arc welding equipment, MRI testing (magnetic resonance imaging), TENS units (transcutaneous nerve stimulators).  Call the office for questions about other devices.   Avoid electrical appliances that are in poor condition or are not properly grounded.   Microwave ovens are safe to be near or to operate.  Additional information for defibrillator patients should your device go off:   If your device goes off ONCE and you feel fine afterward, notify the device clinic nurses.   If your device goes off ONCE and you do not feel well afterward, call 911.   If your device goes off TWICE, call 911.   If your device goes  off THREE times in one day, call 911.  DO NOT DRIVE YOURSELF OR A FAMILY MEMBER WITH A DEFIBRILLATOR TO THE HOSPITAL--CALL 911.

## 2014-01-01 NOTE — Discharge Summary (Signed)
Discharge Summary   Patient ID: Haidyn [facility] MRN: [id number], DOB/AGE: 78-Jan-1927 78 y.o. Admit date: 12/30/2013 D/C date:     01/01/2014  Primary Cardiologist: Dr. McDowell/ Dr. Rayann Heman  Principal Problem:   Symptomatic Bradycardia due to Mobitz 2, second degree AV block Active Problems:   Essential hypertension, benign   Mixed hyperlipidemia   Heart murmur   Mobitz type 2 second degree AV block    Admission Dates: 12/30/13-01/01/14 Discharge Diagnosis: Complete Heart Block s/p PPM with a Medtronic Adapta L (serial number [id number]) pacemaker  HPI: ANAGABRIELA JOKERST is a 80 y.o. female with a history of HTN, HLD and bradycardia who presented for planned elective pacemaker implantation.   Mrs. Fambro was referred to Dr. Lovena Le by Dr. Domenic Polite for consideration for PPM insertion. She is a very pleasant 80 yo woman with periods of fatigue and weakness. She has never had frank syncope. She was on a beta blocker but this was stopped after she began having symptomatic bradycardia. She has had persistent symptoms and found on cardiac monitoring to have daytime periods of high grade AV block with 2:1 heart block in the setting of RBBB. No syncope. She does have fatigue and weakness   Hospital Course  Symptomatic Bradycardia due to Mobitz 2, second degree AV block -- S/p PPM on 12/30/13 -- Device interrogation is reviewed and normal  -- Cxr is normal   Chest pain- as this has completely resolved, perforation seems unlikely. Lead interrogation and cxr were unrevealing. Echo by Dr. Rayann Heman on 12/31/13 did not show effusion. She has been hemodynamically stable and her symptoms are now resolved.   HTN- continue amlodipine 10mg    HLD- continue fish oil  The patient has had an uncomplicated hospital course and is recovering well.  She has been seen by Dr. Rayann Heman today and deemed ready for discharge home. Discharge medications are listed below. Follow-up with the device clinic 10/19 as  scheduled.    Discharge Vitals: Blood pressure 140/58, pulse 79, temperature 98.7 F (37.1 C), temperature source Oral, resp. rate 20, height 5\' 7"  (1.702 m), weight 151 lb 10.4 oz (68.788 kg), SpO2 96.00%.  Labs: Lab Results  Component Value Date   WBC 7.4 12/23/2013   HGB 13.2 12/23/2013   HCT 37.5 12/23/2013   MCV 86.0 12/23/2013   PLT 251 12/23/2013     Diagnostic Studies/Procedures   Dg Chest 2 View  12/31/2013   CLINICAL DATA:  Chest pain radiation and tube neck area. Recent pacemaker insertion.  EXAM: CHEST  2 VIEW  COMPARISON:  December 30, 2013  FINDINGS: There is mild left base atelectasis. Elsewhere lungs are clear. The heart size and pulmonary vascularity are normal. Pacemaker leads are attached to the right atrium and right ventricle. No pneumothorax. There is degenerative change in the thoracic spine. No adenopathy.  IMPRESSION: Left base atelectatic change. Elsewhere lungs are clear. No pneumothorax. Pacemaker leads attached to right atrium and right ventricle.     Dg Chest Port 1 View  12/30/2013   CLINICAL DATA:  Left chest pain which radiates across the chest.  EXAM: PORTABLE CHEST - 1 VIEW  COMPARISON:  October 10, 2010  FINDINGS: The heart size is enlarged. Dual lead cardiac pacemaker is identified with the tips over the right atrium and right ventricle. The aorta is tortuous. The mediastinal contour is normal. Both lungs are clear. The visualized skeletal structures are stable.  IMPRESSION: No active cardiopulmonary disease.      PREPROCEDURE DIAGNOSIS: Symptomatic  Bradycardia due to Mobitz 2, second degree AV block  POSTPROCEDURE DIAGNOSIS: Same as preprocedure diagnosis  PROCEDURES:  1. Pacemaker implantation.  INTRODUCTION: RASHEA HOSKIE is a 80 y.o. female with a history of bradycardia who presents today for pacemaker implantation. The patient reports intermittent episodes of dizziness over the past few months. No reversible causes have been identified. The patient  therefore presents today for pacemaker implantation.  DESCRIPTION OF PROCEDURE: Informed written consent was obtained, and  the patient was brought to the electrophysiology lab in a fasting state. The patient required no sedation for the procedure today. The patients left chest was prepped and draped in the usual sterile fashion by the EP lab staff. The skin overlying the left deltopectoral region was infiltrated with lidocaine for local analgesia. A 4-cm incision was made over the left deltopectoral region. A left subcutaneous pacemaker pocket was fashioned using a combination of sharp and blunt dissection. Electrocautery was required to assure hemostasis.  RA/RV Lead Placement:  The left axillary vein was therefore directly visualized and cannulated. Through the left axillary vein, a Medtronic 5076 (serial number [id number]) right atrial lead and a Medtronic 5076 (serial number [id number]) right ventricular lead were advanced with fluoroscopic visualization into the right atrial appendage and right ventricular apical septal positions respectively. Initial atrial lead P- waves measured 2 mV with impedance of 659 ohms and a threshold of 1 V at 0.5 msec. Right ventricular lead R-waves measured 15 mV with an impedance of 952 ohms and a threshold of 1 V at 0.5 msec. Both leads were secured to the pectoralis fascia using #2-0 silk over the suture sleeves.  Device Placement:  The leads were then connected to a Medtronic Adapta L (serial number [id number]) pacemaker. The pocket was irrigated with copious gentamicin solution. The pacemaker was then placed into the pocket. The pocket was then closed in 2 layers with 2.0 Vicryl suture for the subcutaneous and subcuticular layers. Steri-Strips and a sterile dressing were then applied. There were no early apparent complications.  CONCLUSIONS:  1. Successful implantation of a medtronic dual-chamber pacemaker for symptomatic bradycardia due to symptomatic Mobitz 2,  second degree AV block  2. No early apparent complications.     Discharge Medications     Medication List         amLODipine 10 MG tablet  Commonly known as:  NORVASC  Take 10 mg by mouth daily.     CALCIUM 600 PO  Take 600 mg by mouth daily.     Co Q 10 100 MG Caps  Take 100 mg by mouth daily.     diphenhydrAMINE 25 MG tablet  Commonly known as:  BENADRYL  Take 25 mg by mouth at bedtime as needed for sleep.     fish oil-omega-3 fatty acids 1000 MG capsule  Take 1 g by mouth daily.     naproxen sodium 220 MG tablet  Commonly known as:  ANAPROX  Take 220 mg by mouth daily.        Disposition   The patient will be discharged in stable condition to home.      Duration of Discharge Encounter: Greater than 30 minutes including physician and PA time.  SignedAngelena Form R PA-C 01/01/2014, 10:16 AM  Thompson Grayer MD

## 2014-01-09 ENCOUNTER — Ambulatory Visit (INDEPENDENT_AMBULATORY_CARE_PROVIDER_SITE_OTHER): Payer: Medicare Other | Admitting: *Deleted

## 2014-01-09 DIAGNOSIS — I442 Atrioventricular block, complete: Secondary | ICD-10-CM

## 2014-01-09 LAB — MDC_IDC_ENUM_SESS_TYPE_INCLINIC
Battery Impedance: 100 Ohm
Battery Voltage: 2.8 V
Brady Statistic AP VP Percent: 29 %
Brady Statistic AP VS Percent: 0 %
Brady Statistic AS VP Percent: 70 %
Brady Statistic AS VS Percent: 1 %
Date Time Interrogation Session: 20151019104836
Lead Channel Impedance Value: 546 Ohm
Lead Channel Pacing Threshold Amplitude: 0.5 V
Lead Channel Pacing Threshold Pulse Width: 0.4 ms
Lead Channel Pacing Threshold Pulse Width: 0.4 ms
Lead Channel Sensing Intrinsic Amplitude: 11.2 mV
Lead Channel Sensing Intrinsic Amplitude: 2.8 mV
Lead Channel Setting Pacing Amplitude: 3.5 V
Lead Channel Setting Sensing Sensitivity: 2.8 mV
MDC IDC MSMT BATTERY REMAINING LONGEVITY: 120 mo
MDC IDC MSMT LEADCHNL RV IMPEDANCE VALUE: 663 Ohm
MDC IDC MSMT LEADCHNL RV PACING THRESHOLD AMPLITUDE: 0.75 V
MDC IDC SET LEADCHNL RA PACING AMPLITUDE: 3.5 V
MDC IDC SET LEADCHNL RV PACING PULSEWIDTH: 0.4 ms

## 2014-01-09 NOTE — Progress Notes (Signed)
Wound check appointment. Steri-strips removed. Wound without redness or edema. Incision edges approximated, wound well healed. Normal device function. Thresholds, sensing, and impedances consistent with implant measurements. Device programmed at 3.5V for extra safety margin until 3 month visit. Histogram distribution appropriate for patient and level of activity. No mode switches or high ventricular rates noted. Patient educated about wound care, arm mobility, lifting restrictions. ROV in 3 months with GT/R.

## 2014-01-11 DIAGNOSIS — R001 Bradycardia, unspecified: Secondary | ICD-10-CM | POA: Diagnosis not present

## 2014-01-11 DIAGNOSIS — M4806 Spinal stenosis, lumbar region: Secondary | ICD-10-CM | POA: Diagnosis not present

## 2014-01-11 DIAGNOSIS — I10 Essential (primary) hypertension: Secondary | ICD-10-CM | POA: Diagnosis not present

## 2014-02-10 ENCOUNTER — Ambulatory Visit (INDEPENDENT_AMBULATORY_CARE_PROVIDER_SITE_OTHER): Payer: Medicare Other | Admitting: Cardiology

## 2014-02-10 ENCOUNTER — Encounter: Payer: Self-pay | Admitting: Cardiology

## 2014-02-10 VITALS — BP 134/70 | HR 88 | Ht 67.0 in | Wt 147.0 lb

## 2014-02-10 DIAGNOSIS — I441 Atrioventricular block, second degree: Secondary | ICD-10-CM | POA: Diagnosis not present

## 2014-02-10 DIAGNOSIS — I10 Essential (primary) hypertension: Secondary | ICD-10-CM

## 2014-02-10 NOTE — Patient Instructions (Signed)
Your physician wants you to follow-up in: 1 year You will receive a reminder letter in the mail two months in advance. If you don't receive a letter, please call our office to schedule the follow-up appointment.    Your physician recommends that you continue on your current medications as directed. Please refer to the Current Medication list given to you today.     Thank you for choosing [facility] !  

## 2014-02-10 NOTE — Assessment & Plan Note (Signed)
Blood pressure is reasonably well controlled today. Keep follow-up with Dr. [name].

## 2014-02-10 NOTE — Progress Notes (Signed)
   Reason for visit: Follow-up heart block  Clinical Summary Ms. Kassel is a 80 y.o.female last seen in August. At that time I referred her to see Dr. Lovena Le with history of symptomatic intermittent second-degree heart block. She ultimately underwent placement of a Medtronic Adapta L pacemaker by Dr. Lovena Le in October. She states that she is doing better, more energy, no dizziness. She has had no problems with her device pocket site, had a wound check already. She will be seeing Dr. Lovena Le in February.  Labwork from October showed hemoglobin 13.2, platelets 251, potassium 4.2, BUN 20, creatinine 0.7. ECG in October showed dual-chamber pacing.   Allergies  Allergen Reactions  . Codeine Nausea And Vomiting  . Tramadol Nausea And Vomiting  . Sulfa Antibiotics Rash    Current Outpatient Prescriptions  Medication Sig Dispense Refill  . amLODipine (NORVASC) 10 MG tablet Take 10 mg by mouth daily.    . Calcium Carbonate (CALCIUM 600 PO) Take 600 mg by mouth daily.    . Coenzyme Q10 (CO Q 10) 100 MG CAPS Take 100 mg by mouth daily.    . diphenhydrAMINE (BENADRYL) 25 MG tablet Take 25 mg by mouth at bedtime as needed for sleep.    . fish oil-omega-3 fatty acids 1000 MG capsule Take 1 g by mouth daily.    . naproxen sodium (ANAPROX) 220 MG tablet Take 220 mg by mouth daily.     No current facility-administered medications for this visit.    Past Medical History  Diagnosis Date  . Goiter   . Mixed hyperlipidemia   . Essential hypertension, benign   . Spinal stenosis   . Complete heart block     a. s/p PPM 12/31/13:  Medtronic Adapta L (serial number [id number]) pacemaker  . Thyroid nodule     Social History Ms. Pewitt reports that she has never smoked. She has never used smokeless tobacco. Ms. Parran reports that she does not drink alcohol.  Review of Systems Complete review of systems negative except as otherwise outlined in the clinical summary and also the following. Arthritic  knee pain.  Physical Examination Filed Vitals:   02/10/14 1001  BP: 134/70  Pulse: 88   Filed Weights   02/10/14 1001  Weight: 147 lb (66.679 kg)    Normally nourished appearing, comfortable at rest. HEENT: Conjunctiva and lids normal, oropharynx clear with moist mucosa.  Neck: Supple, no elevated JVP or carotid bruits, no thyromegaly.  Lungs: Clear to auscultation, nonlabored breathing at rest.  Thorax: Well-healed device pocket site on the left. Cardiac: Regular rate and rhythm, no S3, 7-6/1 systolic murmur, no pericardial rub.  Extremities: No pitting edema, distal pulses 1-2+.  Skin: Warm and dry. Muscular skeletal: No kyphosis. Neuropsychiatric: Alert and oriented x3, affect grossly appropriate.   Problem List and Plan   Mobitz type 2 second degree AV block Status post Medtronic pacemaker, followed by Dr. Lovena Le.  Essential hypertension, benign Blood pressure is reasonably well controlled today. Keep follow-up with Dr. [name].    Satira Sark, M.D., F.A.C.C.

## 2014-02-10 NOTE — Assessment & Plan Note (Signed)
Status post Medtronic pacemaker, followed by Dr. Lovena Le.

## 2014-02-22 ENCOUNTER — Encounter: Payer: Self-pay | Admitting: Internal Medicine

## 2014-03-02 ENCOUNTER — Encounter (HOSPITAL_COMMUNITY): Payer: Self-pay | Admitting: Internal Medicine

## 2014-03-08 DIAGNOSIS — H903 Sensorineural hearing loss, bilateral: Secondary | ICD-10-CM | POA: Diagnosis not present

## 2014-04-12 DIAGNOSIS — Z974 Presence of external hearing-aid: Secondary | ICD-10-CM | POA: Diagnosis not present

## 2014-04-12 DIAGNOSIS — H6123 Impacted cerumen, bilateral: Secondary | ICD-10-CM | POA: Diagnosis not present

## 2014-04-12 DIAGNOSIS — H903 Sensorineural hearing loss, bilateral: Secondary | ICD-10-CM | POA: Diagnosis not present

## 2014-05-12 ENCOUNTER — Ambulatory Visit (INDEPENDENT_AMBULATORY_CARE_PROVIDER_SITE_OTHER): Payer: Medicare Other | Admitting: Internal Medicine

## 2014-05-12 ENCOUNTER — Encounter: Payer: Self-pay | Admitting: Internal Medicine

## 2014-05-12 VITALS — BP 114/86 | HR 80 | Ht 67.0 in | Wt 145.0 lb

## 2014-05-12 DIAGNOSIS — Z950 Presence of cardiac pacemaker: Secondary | ICD-10-CM

## 2014-05-12 DIAGNOSIS — I442 Atrioventricular block, complete: Secondary | ICD-10-CM | POA: Diagnosis not present

## 2014-05-12 DIAGNOSIS — I10 Essential (primary) hypertension: Secondary | ICD-10-CM

## 2014-05-12 DIAGNOSIS — E782 Mixed hyperlipidemia: Secondary | ICD-10-CM | POA: Diagnosis not present

## 2014-05-12 LAB — MDC_IDC_ENUM_SESS_TYPE_INCLINIC
Brady Statistic AP VP Percent: 13 %
Brady Statistic AS VP Percent: 86 %
Brady Statistic AS VS Percent: 1 %
Lead Channel Impedance Value: 530 Ohm
Lead Channel Pacing Threshold Amplitude: 0.5 V
Lead Channel Pacing Threshold Pulse Width: 0.4 ms
Lead Channel Sensing Intrinsic Amplitude: 1.4 mV
Lead Channel Sensing Intrinsic Amplitude: 11.2 mV
Lead Channel Setting Pacing Amplitude: 1.5 V
Lead Channel Setting Sensing Sensitivity: 2.8 mV
MDC IDC MSMT BATTERY IMPEDANCE: 100 Ohm
MDC IDC MSMT BATTERY REMAINING LONGEVITY: 154 mo
MDC IDC MSMT BATTERY VOLTAGE: 2.81 V
MDC IDC MSMT LEADCHNL RA IMPEDANCE VALUE: 512 Ohm
MDC IDC MSMT LEADCHNL RA PACING THRESHOLD PULSEWIDTH: 0.4 ms
MDC IDC MSMT LEADCHNL RV PACING THRESHOLD AMPLITUDE: 0.5 V
MDC IDC SESS DTM: 20160219085959
MDC IDC SET LEADCHNL RV PACING AMPLITUDE: 2 V
MDC IDC SET LEADCHNL RV PACING PULSEWIDTH: 0.4 ms
MDC IDC STAT BRADY AP VS PERCENT: 0 %

## 2014-05-12 LAB — PACEMAKER DEVICE OBSERVATION

## 2014-05-12 NOTE — Progress Notes (Signed)
HPI Laura Martin returns today for ongoing management of her PPM. She is a very pleasant 81 yo woman with symptomatic bradycardia. She has had persistent symptoms and found on cardiac monitoring to have daytime periods of high grade AV block with 2:1 heart block in the setting of RBBB. She underwent insertion of a medtronic DDD PM several months ago. No syncope. She is improved.  Allergies  Allergen Reactions  . Codeine Nausea And Vomiting  . Tramadol Nausea And Vomiting  . Sulfa Antibiotics Rash     Current Outpatient Prescriptions  Medication Sig Dispense Refill  . acetaminophen (TYLENOL) 500 MG tablet Take 500 mg by mouth every 6 (six) hours as needed.    Marland Kitchen amLODipine (NORVASC) 10 MG tablet Take 10 mg by mouth daily.    . Calcium Carbonate (CALCIUM 600 PO) Take 600 mg by mouth daily.    . Coenzyme Q10 (CO Q 10) 100 MG CAPS Take 100 mg by mouth daily.    . diphenhydrAMINE (BENADRYL) 25 MG tablet Take 25 mg by mouth at bedtime as needed for sleep.    . fish oil-omega-3 fatty acids 1000 MG capsule Take 1 g by mouth daily.     No current facility-administered medications for this visit.     Past Medical History  Diagnosis Date  . Goiter   . Mixed hyperlipidemia   . Essential hypertension, benign   . Spinal stenosis   . Complete heart block     a. s/p PPM 12/31/13:  Medtronic Adapta L (serial number [id number]) pacemaker  . Thyroid nodule     ROS:   All systems reviewed and negative except as noted in the HPI.   Past Surgical History  Procedure Laterality Date  . Biopsy thyroid Left 11/2006  . Total knee arthroplasty Left 09/2010  . Insert / replace / remove pacemaker  12/30/2013  . Tonsillectomy  ~ 1943  . Knee arthroscopy Left 1986  . Breast biopsy Right 1990's    "calcium deposit"  . Cataract extraction w/ intraocular lens  implant, bilateral Bilateral 2001  . Dilation and curettage of uterus      S/P miscarriage  . Permanent pacemaker insertion N/A 12/30/2013     Procedure: PERMANENT PACEMAKER INSERTION;  Surgeon: Evans Lance, MD;  Location: [facility] CATH LAB;  Service: Cardiovascular;  Laterality: N/A;     Family History  Problem Relation Age of Onset  . Cancer    . Stroke       History   Social History  . Marital Status: Married    Spouse Name: N/A  . Number of Children: N/A  . Years of Education: N/A   Occupational History  . Not on file.   Social History Main Topics  . Smoking status: Never Smoker   . Smokeless tobacco: Never Used  . Alcohol Use: No  . Drug Use: No  . Sexual Activity: Not on file   Other Topics Concern  . Not on file   Social History Narrative     BP 114/86 mmHg  Pulse 80  Ht 5\' 7"  (1.702 m)  Wt 145 lb (65.772 kg)  BMI 22.71 kg/m2  SpO2 95%  Physical Exam:  Well appearing elderly woman, NAD HEENT: Unremarkable Neck:  No JVD, no thyromegally Back:  No CVA tenderness Lungs:  Clear with no wheezes HEART:  Regular brady rhythm, no murmurs, no rubs, no clicks Abd:  soft, positive bowel sounds, no organomegally, no rebound, no guarding Ext:  2 plus pulses, no edema, no cyanosis, no clubbing Skin:  No rashes no nodules Neuro:  CN II through XII intact, motor grossly intact  PM interogation - normal device function.  Assess/Plan:

## 2014-05-12 NOTE — Assessment & Plan Note (Signed)
Her Medtronic DDD PM is working normally. Will recheck in several months. 

## 2014-05-12 NOTE — Assessment & Plan Note (Signed)
Her blood pressure is well controlled. No change in medical therapy. 

## 2014-05-12 NOTE — Assessment & Plan Note (Signed)
She will maintain a low fat diet.

## 2014-05-12 NOTE — Patient Instructions (Addendum)
Your physician wants you to follow-up in: with Dr. Lovena Le in Oct.  You will receive a reminder letter in the mail two months in advance. If you don't receive a letter, please call our office to schedule the follow-up appointment.  Remote monitoring is used to monitor your Pacemaker of ICD from home. This monitoring reduces the number of office visits required to check your device to one time per year. It allows [place] to keep an eye on the functioning of your device to ensure it is working properly. You are scheduled for a device check from home on Aug 14, 2014 . You may send your transmission at any time that day. If you have a wireless device, the transmission will be sent automatically. After your physician reviews your transmission, you will receive a postcard with your next transmission date.  Your physician recommends that you continue on your current medications as directed. Please refer to the Current Medication list given to you today.  Thank you for choosing [facility]!

## 2014-05-15 ENCOUNTER — Encounter: Payer: Self-pay | Admitting: Internal Medicine

## 2014-06-23 ENCOUNTER — Telehealth: Payer: Self-pay | Admitting: Internal Medicine

## 2014-06-23 NOTE — Telephone Encounter (Signed)
Pt called as family was worried about HR as when she usually take HR at home it is in the 60's.  When pt took BP and HR it was in the 80's and they were worried it was to high for her.  Pt stated she has been feeling great and taking Benadryl at night for sleep no c/o of chest pain, SOB, or dizziness; just wanted to make sure it wasn't something wrong.  Pt educated that her HR last time she was in the office was in the 80 and she was calmed by that.  Let pt know that if she has any symptoms or problems to call our office.  Pt agreed and no additional questions at this time.

## 2014-06-23 NOTE — Telephone Encounter (Signed)
New message     Pt c/o BP issue: STAT if pt c/o blurred vision, one-sided weakness or slurred speech  1. What are your last 5 BP readings? 126/64 Hr 86, 124/63 HR 90 irr heart rate, 139/65 HR 87 irr heart rate 2. Are you having any other symptoms (ex. Dizziness, headache, blurred vision, passed out)? no  3. What is your BP issue?  Pt says her pulse is high.  She has a pacemaker and her children wanted her to call the doctor

## 2014-06-27 DIAGNOSIS — Z961 Presence of intraocular lens: Secondary | ICD-10-CM | POA: Diagnosis not present

## 2014-07-12 DIAGNOSIS — I10 Essential (primary) hypertension: Secondary | ICD-10-CM | POA: Diagnosis not present

## 2014-07-12 DIAGNOSIS — R001 Bradycardia, unspecified: Secondary | ICD-10-CM | POA: Diagnosis not present

## 2014-07-12 DIAGNOSIS — M4806 Spinal stenosis, lumbar region: Secondary | ICD-10-CM | POA: Diagnosis not present

## 2014-07-12 DIAGNOSIS — M545 Low back pain: Secondary | ICD-10-CM | POA: Diagnosis not present

## 2014-08-14 ENCOUNTER — Ambulatory Visit (INDEPENDENT_AMBULATORY_CARE_PROVIDER_SITE_OTHER): Payer: Medicare Other | Admitting: *Deleted

## 2014-08-14 DIAGNOSIS — I442 Atrioventricular block, complete: Secondary | ICD-10-CM

## 2014-08-14 NOTE — Progress Notes (Signed)
Remote pacemaker transmission.   

## 2014-08-15 LAB — CUP PACEART REMOTE DEVICE CHECK
Brady Statistic AP VP Percent: 15 %
Brady Statistic AP VS Percent: 0 %
Brady Statistic AS VP Percent: 84 %
Lead Channel Impedance Value: 444 Ohm
Lead Channel Pacing Threshold Amplitude: 0.5 V
Lead Channel Pacing Threshold Pulse Width: 0.4 ms
Lead Channel Sensing Intrinsic Amplitude: 1.4 mV
Lead Channel Sensing Intrinsic Amplitude: 8 mV
Lead Channel Setting Pacing Amplitude: 1.5 V
MDC IDC MSMT BATTERY IMPEDANCE: 100 Ohm
MDC IDC MSMT BATTERY REMAINING LONGEVITY: 154 mo
MDC IDC MSMT BATTERY VOLTAGE: 2.81 V
MDC IDC MSMT LEADCHNL RV IMPEDANCE VALUE: 580 Ohm
MDC IDC SESS DTM: 20160523112721
MDC IDC SET LEADCHNL RV PACING AMPLITUDE: 2 V
MDC IDC SET LEADCHNL RV PACING PULSEWIDTH: 0.4 ms
MDC IDC SET LEADCHNL RV SENSING SENSITIVITY: 2.8 mV
MDC IDC STAT BRADY AS VS PERCENT: 1 %

## 2014-08-23 ENCOUNTER — Encounter: Payer: Self-pay | Admitting: Cardiology

## 2014-08-30 ENCOUNTER — Encounter: Payer: Self-pay | Admitting: Internal Medicine

## 2014-09-19 DIAGNOSIS — M5416 Radiculopathy, lumbar region: Secondary | ICD-10-CM | POA: Diagnosis not present

## 2014-09-19 DIAGNOSIS — M545 Low back pain: Secondary | ICD-10-CM | POA: Diagnosis not present

## 2014-09-19 DIAGNOSIS — M4806 Spinal stenosis, lumbar region: Secondary | ICD-10-CM | POA: Diagnosis not present

## 2014-10-02 DIAGNOSIS — M4806 Spinal stenosis, lumbar region: Secondary | ICD-10-CM | POA: Diagnosis not present

## 2014-10-02 DIAGNOSIS — M5416 Radiculopathy, lumbar region: Secondary | ICD-10-CM | POA: Diagnosis not present

## 2014-10-30 DIAGNOSIS — R350 Frequency of micturition: Secondary | ICD-10-CM | POA: Diagnosis not present

## 2014-11-13 ENCOUNTER — Ambulatory Visit (INDEPENDENT_AMBULATORY_CARE_PROVIDER_SITE_OTHER): Payer: Medicare Other | Admitting: *Deleted

## 2014-11-13 DIAGNOSIS — I442 Atrioventricular block, complete: Secondary | ICD-10-CM | POA: Diagnosis not present

## 2014-11-14 NOTE — Progress Notes (Signed)
Remote pacemaker transmission.   

## 2014-11-17 LAB — CUP PACEART REMOTE DEVICE CHECK
Battery Impedance: 100 Ohm
Battery Remaining Longevity: 156 mo
Brady Statistic AS VP Percent: 81 %
Brady Statistic AS VS Percent: 1 %
Lead Channel Impedance Value: 496 Ohm
Lead Channel Impedance Value: 580 Ohm
Lead Channel Pacing Threshold Amplitude: 0.5 V
Lead Channel Pacing Threshold Pulse Width: 0.4 ms
Lead Channel Pacing Threshold Pulse Width: 0.4 ms
Lead Channel Setting Pacing Amplitude: 1.5 V
Lead Channel Setting Sensing Sensitivity: 2 mV
MDC IDC MSMT BATTERY VOLTAGE: 2.8 V
MDC IDC MSMT LEADCHNL RA SENSING INTR AMPL: 1 mV
MDC IDC MSMT LEADCHNL RV PACING THRESHOLD AMPLITUDE: 0.625 V
MDC IDC SESS DTM: 20160822152041
MDC IDC SET LEADCHNL RV PACING AMPLITUDE: 2 V
MDC IDC SET LEADCHNL RV PACING PULSEWIDTH: 0.4 ms
MDC IDC STAT BRADY AP VP PERCENT: 18 %
MDC IDC STAT BRADY AP VS PERCENT: 0 %

## 2014-11-23 ENCOUNTER — Encounter: Payer: Self-pay | Admitting: Cardiology

## 2014-12-07 ENCOUNTER — Encounter: Payer: Self-pay | Admitting: Internal Medicine

## 2014-12-28 DIAGNOSIS — D2362 Other benign neoplasm of skin of left upper limb, including shoulder: Secondary | ICD-10-CM | POA: Diagnosis not present

## 2014-12-28 DIAGNOSIS — D485 Neoplasm of uncertain behavior of skin: Secondary | ICD-10-CM | POA: Diagnosis not present

## 2014-12-28 DIAGNOSIS — D225 Melanocytic nevi of trunk: Secondary | ICD-10-CM | POA: Diagnosis not present

## 2014-12-28 DIAGNOSIS — L723 Sebaceous cyst: Secondary | ICD-10-CM | POA: Diagnosis not present

## 2015-01-01 ENCOUNTER — Ambulatory Visit (INDEPENDENT_AMBULATORY_CARE_PROVIDER_SITE_OTHER): Payer: Medicare Other | Admitting: Internal Medicine

## 2015-01-01 ENCOUNTER — Encounter: Payer: Self-pay | Admitting: Internal Medicine

## 2015-01-01 VITALS — BP 128/62 | HR 89 | Ht 67.0 in | Wt 148.8 lb

## 2015-01-01 DIAGNOSIS — Z950 Presence of cardiac pacemaker: Secondary | ICD-10-CM | POA: Diagnosis not present

## 2015-01-01 DIAGNOSIS — I442 Atrioventricular block, complete: Secondary | ICD-10-CM

## 2015-01-01 DIAGNOSIS — E782 Mixed hyperlipidemia: Secondary | ICD-10-CM

## 2015-01-01 DIAGNOSIS — I10 Essential (primary) hypertension: Secondary | ICD-10-CM | POA: Diagnosis not present

## 2015-01-01 LAB — CUP PACEART INCLINIC DEVICE CHECK
Battery Impedance: 100 Ohm
Battery Remaining Longevity: 157 mo
Battery Voltage: 2.8 V
Brady Statistic AS VP Percent: 81 %
Brady Statistic AS VS Percent: 1 %
Date Time Interrogation Session: 20161010130138
Lead Channel Impedance Value: 476 Ohm
Lead Channel Pacing Threshold Amplitude: 0.5 V
Lead Channel Pacing Threshold Amplitude: 0.5 V
Lead Channel Pacing Threshold Pulse Width: 0.4 ms
Lead Channel Pacing Threshold Pulse Width: 0.4 ms
Lead Channel Setting Pacing Amplitude: 2.5 V
Lead Channel Setting Pacing Pulse Width: 0.4 ms
Lead Channel Setting Sensing Sensitivity: 2 mV
MDC IDC MSMT LEADCHNL RA SENSING INTR AMPL: 1 mV
MDC IDC MSMT LEADCHNL RV IMPEDANCE VALUE: 623 Ohm
MDC IDC MSMT LEADCHNL RV SENSING INTR AMPL: 5.6 mV
MDC IDC SET LEADCHNL RA PACING AMPLITUDE: 2 V
MDC IDC STAT BRADY AP VP PERCENT: 18 %
MDC IDC STAT BRADY AP VS PERCENT: 0 %

## 2015-01-01 NOTE — Assessment & Plan Note (Signed)
Her medtronic DDD PM is working normally. Will recheck in several months. 

## 2015-01-01 NOTE — Progress Notes (Signed)
HPI Laura Martin returns today for ongoing management of her PPM. She is a very pleasant 81 yo woman with symptomatic bradycardia. She has had persistent symptoms and found on cardiac monitoring to have daytime periods of high grade AV block with 2:1 heart block in the setting of RBBB. She underwent insertion of a DDD PM 12 months ago. No syncope. She is improved. She remains active walking her dog.  Allergies  Allergen Reactions  . Codeine Nausea And Vomiting  . Tramadol Nausea And Vomiting  . Sulfa Antibiotics Rash     Current Outpatient Prescriptions  Medication Sig Dispense Refill  . acetaminophen (TYLENOL) 500 MG tablet Take 500 mg by mouth every 6 (six) hours as needed.    Marland Kitchen amLODipine (NORVASC) 10 MG tablet Take 10 mg by mouth daily.    . Calcium Carbonate (CALCIUM 600 PO) Take 600 mg by mouth daily.    . Coenzyme Q10 (CO Q 10) 100 MG CAPS Take 100 mg by mouth daily.    . diphenhydrAMINE (BENADRYL) 25 MG tablet Take 25 mg by mouth at bedtime as needed for sleep.    Marland Kitchen doxycycline (VIBRAMYCIN) 100 MG capsule     . fish oil-omega-3 fatty acids 1000 MG capsule Take 1 g by mouth daily.     No current facility-administered medications for this visit.     Past Medical History  Diagnosis Date  . Goiter   . Mixed hyperlipidemia   . Essential hypertension, benign   . Spinal stenosis   . Complete heart block ([place])     a. s/p PPM 12/31/13:  Medtronic Adapta L (serial number [id number]) pacemaker  . Thyroid nodule     ROS:   All systems reviewed and negative except as noted in the HPI.   Past Surgical History  Procedure Laterality Date  . Biopsy thyroid Left 11/2006  . Total knee arthroplasty Left 09/2010  . Insert / replace / remove pacemaker  12/30/2013  . Tonsillectomy  ~ 1943  . Knee arthroscopy Left 1986  . Breast biopsy Right 1990's    "calcium deposit"  . Cataract extraction w/ intraocular lens  implant, bilateral Bilateral 2001  . Dilation and curettage of  uterus      S/P miscarriage  . Permanent pacemaker insertion N/A 12/30/2013    Procedure: PERMANENT PACEMAKER INSERTION;  Surgeon: Evans Lance, MD;  Location: [facility] CATH LAB;  Service: Cardiovascular;  Laterality: N/A;     Family History  Problem Relation Age of Onset  . Cancer    . Stroke       Social History   Social History  . Marital Status: Married    Spouse Name: N/A  . Number of Children: N/A  . Years of Education: N/A   Occupational History  . Not on file.   Social History Main Topics  . Smoking status: Never Smoker   . Smokeless tobacco: Never Used  . Alcohol Use: No  . Drug Use: No  . Sexual Activity: Not on file   Other Topics Concern  . Not on file   Social History Narrative     BP 128/62 mmHg  Pulse 89  Ht 5\' 7"  (1.702 m)  Wt 148 lb 12.8 oz (67.495 kg)  BMI 23.30 kg/m2  SpO2 96%  Physical Exam:  Well appearing elderly woman, NAD HEENT: Unremarkable Neck:  No JVD, no thyromegally Back:  No CVA tenderness Lungs:  Clear with no wheezes HEART:  Regular brady rhythm, no  murmurs, no rubs, no clicks Abd:  soft, positive bowel sounds, no organomegally, no rebound, no guarding Ext:  2 plus pulses, no edema, no cyanosis, no clubbing Skin:  No rashes no nodules Neuro:  CN II through XII intact, motor grossly intact  PM interogation - normal device function.  Assess/Plan:

## 2015-01-01 NOTE — Assessment & Plan Note (Signed)
She will continue her fish oil and a low fat diet.

## 2015-01-01 NOTE — Assessment & Plan Note (Signed)
Her blood pressure is well controlled. No change in meds. 

## 2015-01-01 NOTE — Patient Instructions (Signed)
Your physician wants you to follow-up in: 1 year with Dr Knox Saliva will receive a reminder letter in the mail two months in advance. If you don't receive a letter, please call our office to schedule the follow-up appointment.     Your physician recommends that you continue on your current medications as directed. Please refer to the Current Medication list given to you today.     Remote monitoring is used to monitor your Pacemaker of ICD from home. This monitoring reduces the number of office visits required to check your device to one time per year. It allows [place] to keep an eye on the functioning of your device to ensure it is working properly. You are scheduled for a device check from home on 04/03/15. You may send your transmission at any time that day. If you have a wireless device, the transmission will be sent automatically. After your physician reviews your transmission, you will receive a postcard with your next transmission date.     Thank you for choosing [facility] !

## 2015-01-11 DIAGNOSIS — Z950 Presence of cardiac pacemaker: Secondary | ICD-10-CM | POA: Diagnosis not present

## 2015-01-11 DIAGNOSIS — R1310 Dysphagia, unspecified: Secondary | ICD-10-CM | POA: Diagnosis not present

## 2015-01-11 DIAGNOSIS — M4806 Spinal stenosis, lumbar region: Secondary | ICD-10-CM | POA: Diagnosis not present

## 2015-01-11 DIAGNOSIS — I10 Essential (primary) hypertension: Secondary | ICD-10-CM | POA: Diagnosis not present

## 2015-01-20 IMAGING — CR DG CHEST 2V
2 series · 2 of 2 positions shown · non-contrast
Comparison: December 30, 2013

CLINICAL DATA: Chest pain radiation and tube neck area. Recent
pacemaker insertion.

EXAM:
CHEST  2 VIEW

[w chest pa]
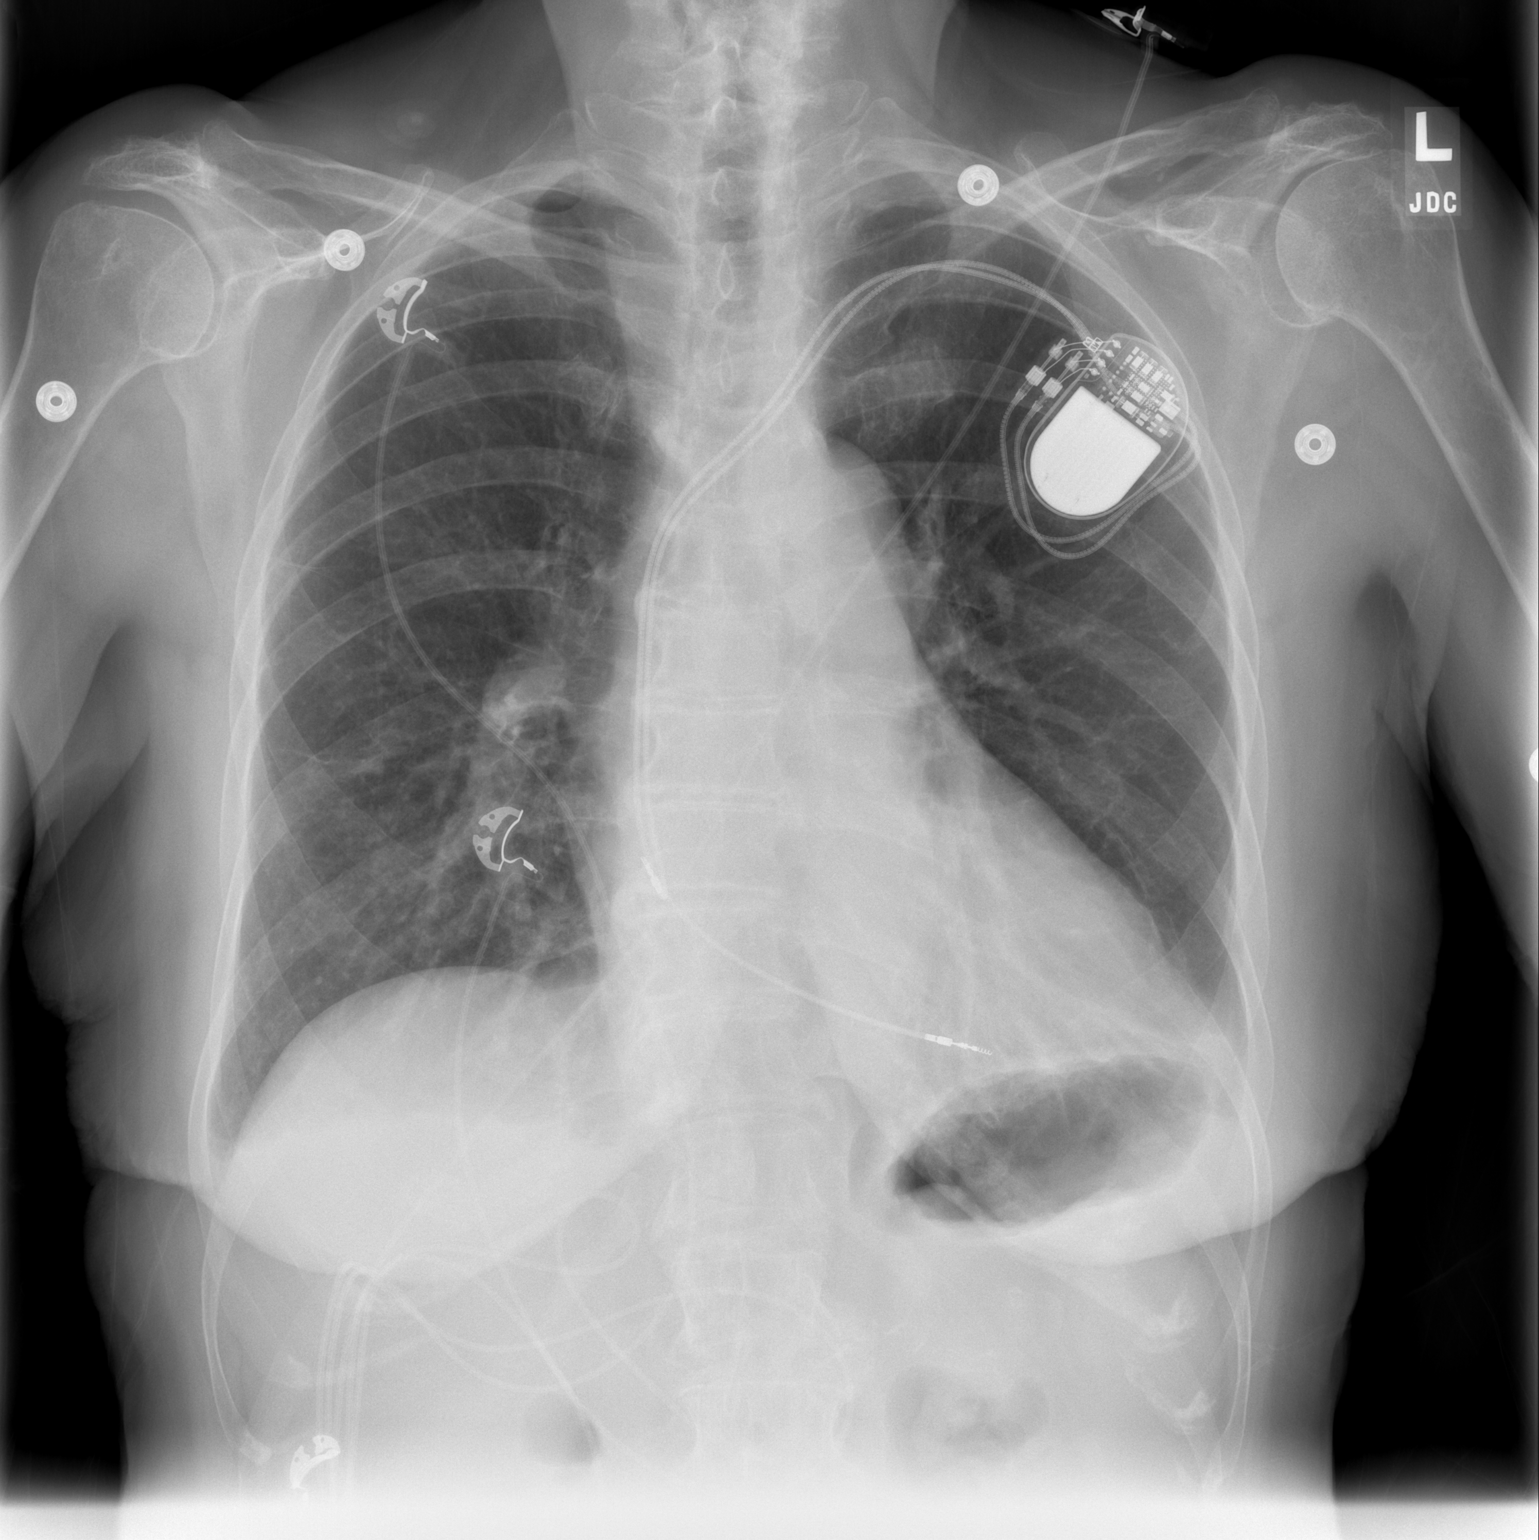

[w chest lat]
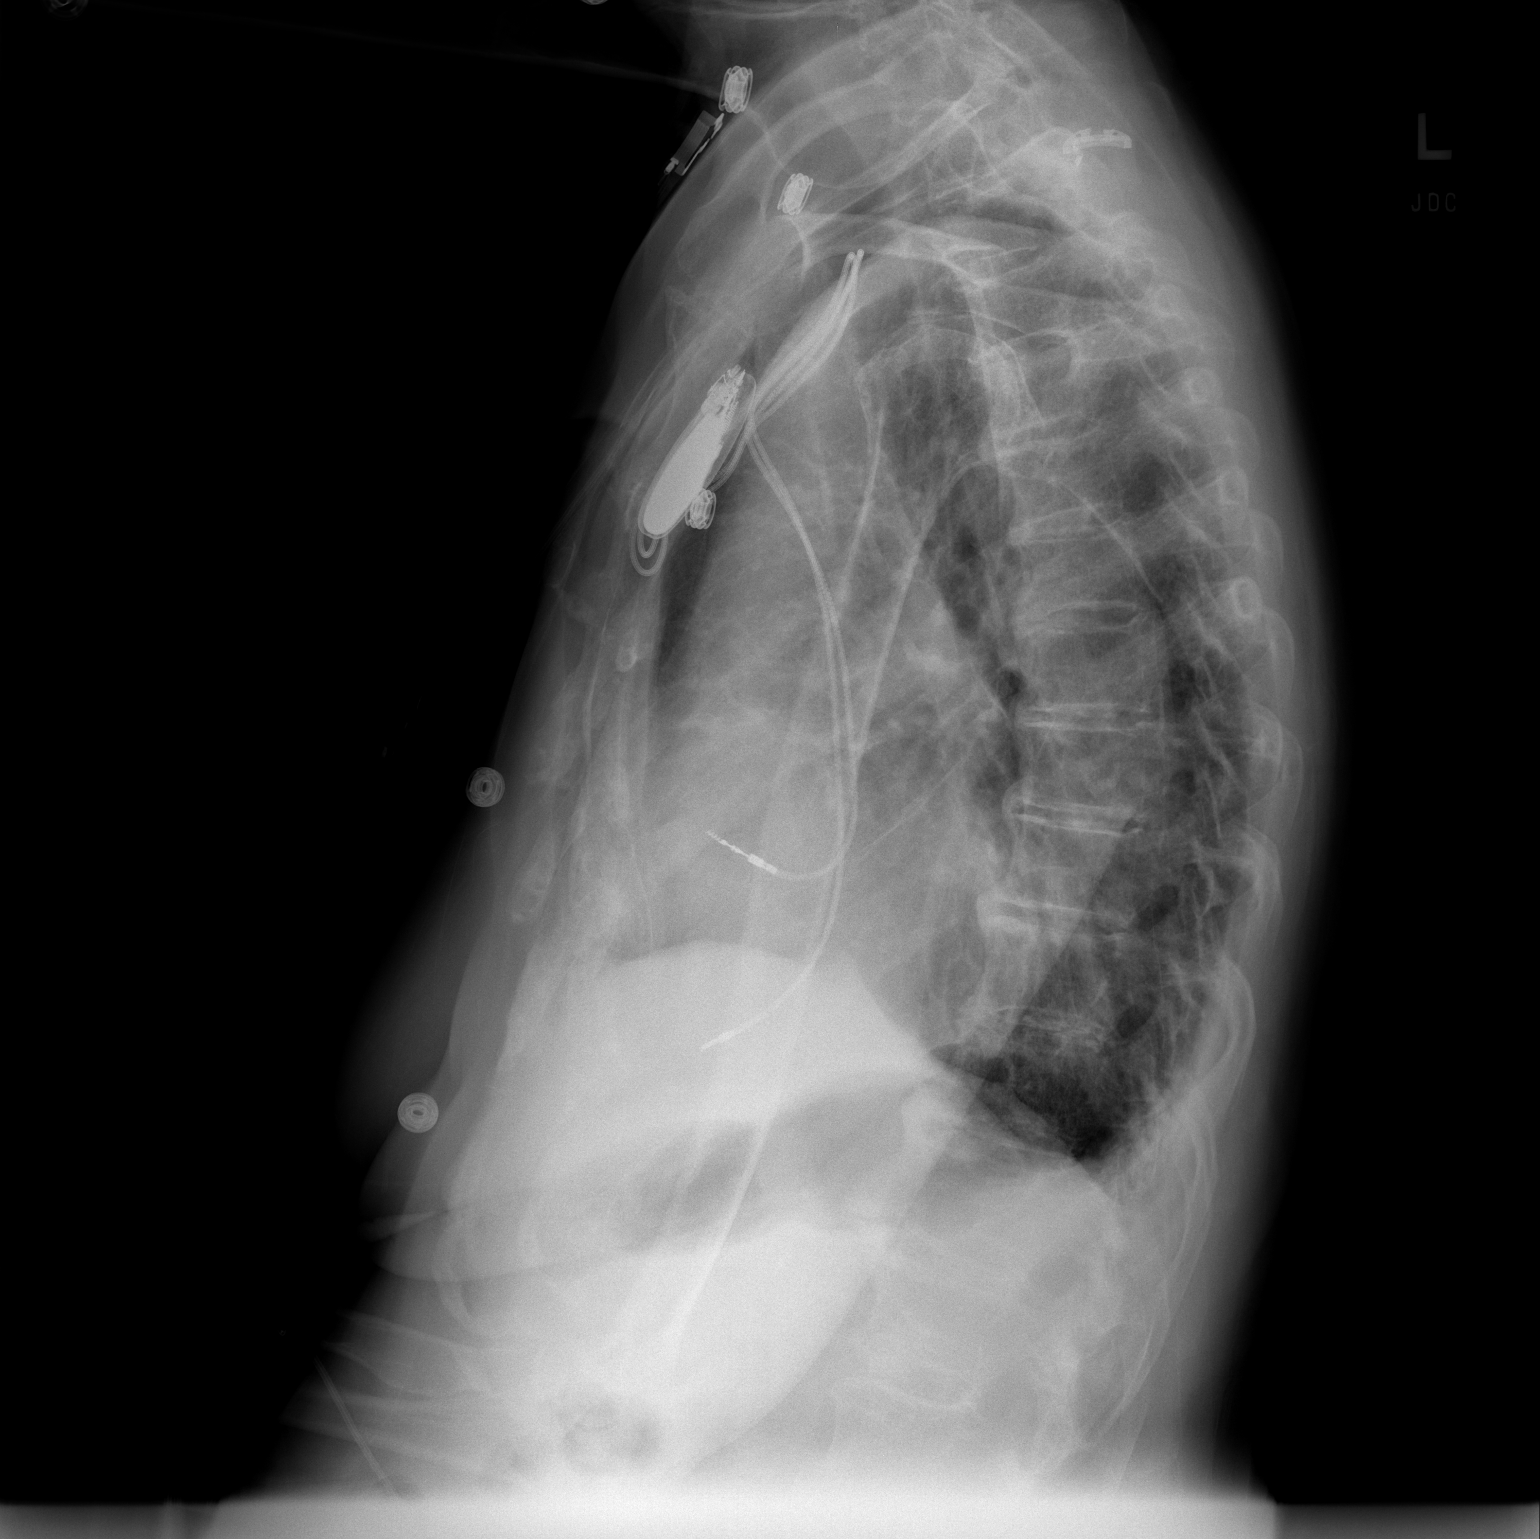

[2 of 2 positions shown; findings below may reference images not displayed]

FINDINGS: There is mild left base atelectasis. Elsewhere lungs are clear. The
heart size and pulmonary vascularity are normal. Pacemaker leads are
attached to the right atrium and right ventricle. No pneumothorax.
There is degenerative change in the thoracic spine. No adenopathy.
IMPRESSION: Left base atelectatic change. Elsewhere lungs are clear. No
pneumothorax. Pacemaker leads attached to right atrium and right
ventricle.

## 2015-01-25 ENCOUNTER — Encounter (INDEPENDENT_AMBULATORY_CARE_PROVIDER_SITE_OTHER): Payer: Self-pay | Admitting: *Deleted

## 2015-01-30 ENCOUNTER — Encounter: Payer: Self-pay | Admitting: Cardiology

## 2015-01-30 ENCOUNTER — Encounter: Payer: Self-pay | Admitting: Internal Medicine

## 2015-02-09 ENCOUNTER — Encounter: Payer: Self-pay | Admitting: Cardiology

## 2015-02-09 ENCOUNTER — Ambulatory Visit (INDEPENDENT_AMBULATORY_CARE_PROVIDER_SITE_OTHER): Payer: Medicare Other | Admitting: Cardiology

## 2015-02-09 VITALS — BP 128/68 | HR 72 | Ht 67.0 in | Wt 149.0 lb

## 2015-02-09 DIAGNOSIS — I441 Atrioventricular block, second degree: Secondary | ICD-10-CM | POA: Diagnosis not present

## 2015-02-09 DIAGNOSIS — I10 Essential (primary) hypertension: Secondary | ICD-10-CM

## 2015-02-09 NOTE — Patient Instructions (Signed)
Your physician wants you to follow-up in: 1 year with Dr McDowell You will receive a reminder letter in the mail two months in advance. If you don't receive a letter, please call our office to schedule the follow-up appointment.    Your physician recommends that you continue on your current medications as directed. Please refer to the Current Medication list given to you today.     If you need a refill on your cardiac medications before your next appointment, please call your pharmacy.     Thank you for choosing [facility] !        

## 2015-02-09 NOTE — Progress Notes (Signed)
Cardiology Office Note  Date: 02/09/2015   ID: Laura Martin, DOB 08/18/1925, MRN [id number]  PCP: Alonza Bogus, MD  Primary Cardiologist: Rozann Lesches, MD   Chief Complaint  Patient presents with  . History of heart block  . Hypertension    History of Present Illness: Laura Martin is an 81 y.o. female last seen in November 2015. She presents for a routine follow-up visit. Continues to do well overall, no unusual shortness of breath or chest pain. Medtronic pacemaker is followed by Dr. Lovena Le.  We reviewed her medications, she remains on Norvasc and has good blood pressure control noted today.  Recent ECG from October reviewed below. She has had no change in functional capacity, no syncope.   Past Medical History  Diagnosis Date  . Goiter   . Mixed hyperlipidemia   . Essential hypertension, benign   . Spinal stenosis   . Complete heart block ([place])     a. s/p PPM 12/31/13:  Medtronic Adapta L (serial number [id number]) pacemaker  . Thyroid nodule     Current Outpatient Prescriptions  Medication Sig Dispense Refill  . acetaminophen (TYLENOL) 500 MG tablet Take 500 mg by mouth every 6 (six) hours as needed.    Marland Kitchen amLODipine (NORVASC) 10 MG tablet Take 10 mg by mouth daily.    . Calcium Carbonate (CALCIUM 600 PO) Take 600 mg by mouth daily.    . Coenzyme Q10 (CO Q 10) 100 MG CAPS Take 100 mg by mouth daily.    . diphenhydrAMINE (BENADRYL) 25 MG tablet Take 25 mg by mouth at bedtime as needed for sleep.    . fish oil-omega-3 fatty acids 1000 MG capsule Take 1 g by mouth daily.     No current facility-administered medications for this visit.    Allergies:  Codeine; Tramadol; and Sulfa antibiotics   Social History: The patient  reports that she has never smoked. She has never used smokeless tobacco. She reports that she does not drink alcohol or use illicit drugs.   ROS:  Please see the history of present illness. Otherwise, complete review of systems is  positive for dysphasia - pending workup with GI.  All other systems are reviewed and negative.   Physical Exam: VS:  BP 128/68 mmHg  Pulse 72  Ht 5\' 7"  (1.702 m)  Wt 149 lb (67.586 kg)  BMI 23.33 kg/m2  SpO2 98%, BMI Body mass index is 23.33 kg/(m^2).  Wt Readings from Last 3 Encounters:  02/09/15 149 lb (67.586 kg)  01/01/15 148 lb 12.8 oz (67.495 kg)  05/12/14 145 lb (65.772 kg)     Normally nourished appearing, comfortable at rest. HEENT: Conjunctiva and lids normal, oropharynx clear with moist mucosa.  Neck: Supple, no elevated JVP or carotid bruits, no thyromegaly.  Lungs: Clear to auscultation, nonlabored breathing at rest.  Thorax: Well-healed device pocket site on the left. Cardiac: Regular rate and rhythm, no S3, [id number] systolic murmur, no pericardial rub.  Extremities: No pitting edema, distal pulses 1-2+.   ECG: Tracing from 01/01/2015 showed a ventricular paced rhythm with atrial tracking.  Other Studies Reviewed Today:  Echocardiogram 11/16/2013: Study Conclusions  - Left ventricle: The cavity size was normal. Wall thickness was increased in a pattern of mild LVH. Systolic function was normal. The estimated ejection fraction was in the range of 60% to 65%. Wall motion was normal; there were no regional wall motion abnormalities. Doppler parameters are consistent with abnormal left ventricular relaxation (grade 1 diastolic dysfunction).  Doppler parameters are consistent with high ventricular filling pressure. - Mitral valve: Mildly thickened leaflets . There was trivial regurgitation. - Tricuspid valve: There was mild regurgitation. - Pulmonic valve: There was mild regurgitation. - Pulmonary arteries: PA peak pressure: 33 mm Hg (S).  ASSESSMENT AND PLAN:  1. History of symptomatic second-degree type II heart block status post Medtronic pacemaker, followed by Dr. Lovena Le.  2. Essential hypertension, blood pressure well controlled on  Norvasc. No changes made today.  Current medicines were reviewed at length with the patient today.  Disposition: FU with me in 1 year.   Signed, Satira Sark, MD, [facility] 02/09/2015 10:45 AM    [facility] [address], [place], [place] [address] Phone: [phone]; Fax: [phone]

## 2015-02-28 ENCOUNTER — Encounter (INDEPENDENT_AMBULATORY_CARE_PROVIDER_SITE_OTHER): Payer: Self-pay | Admitting: *Deleted

## 2015-02-28 ENCOUNTER — Ambulatory Visit (INDEPENDENT_AMBULATORY_CARE_PROVIDER_SITE_OTHER): Payer: Medicare Other | Admitting: Internal Medicine

## 2015-02-28 ENCOUNTER — Encounter (INDEPENDENT_AMBULATORY_CARE_PROVIDER_SITE_OTHER): Payer: Self-pay | Admitting: Internal Medicine

## 2015-02-28 ENCOUNTER — Other Ambulatory Visit (INDEPENDENT_AMBULATORY_CARE_PROVIDER_SITE_OTHER): Payer: Self-pay | Admitting: Internal Medicine

## 2015-02-28 VITALS — BP 126/64 | HR 64 | Temp 97.5°F | Ht 67.0 in | Wt 150.2 lb

## 2015-02-28 DIAGNOSIS — R1319 Other dysphagia: Secondary | ICD-10-CM

## 2015-02-28 DIAGNOSIS — R1310 Dysphagia, unspecified: Secondary | ICD-10-CM

## 2015-02-28 NOTE — Patient Instructions (Addendum)
EGD/ED. The risks and benefits such as perforation, bleeding, and infection were reviewed with the patient and is agreeable. 

## 2015-02-28 NOTE — Progress Notes (Signed)
   Subjective:    Patient ID: Laura Martin, female    DOB: [birth year], 81 y.o.   MRN: [id number]  HPI  Referred to our office by Dr. [name] for dysphagia. She tells me she has to be careful with meats and breads. She says she always has water with her to push the food down. Symptoms for about a year.  She tells me she has frequent acid reflux. Recently started on Protonix and since starting she has not had any acid reflux. Her Rx for Protonix has ran out and she says she has frequent acid reflux. Appetite is good. No weight loss. No abdominal pain. She usually has a BM daily. No change in her stools. She says she hx of constipation.   07/12/2014 H and H 13.4 and 37.9, MCV 87.3, Platelet ct 295. NA 140, K 4.2, Chloride 102, Glucose 99, BUN 15.     Review of Systems Past Medical History  Diagnosis Date  . Goiter   . Mixed hyperlipidemia   . Essential hypertension, benign   . Spinal stenosis   . Complete heart block ([place])     a. s/p PPM 12/31/13:  Medtronic Adapta L (serial number [id number]) pacemaker  . Thyroid nodule     Past Surgical History  Procedure Laterality Date  . Biopsy thyroid Left 11/2006  . Total knee arthroplasty Left 09/2010  . Insert / replace / remove pacemaker  12/30/2013  . Tonsillectomy  ~ 1943  . Knee arthroscopy Left 1986  . Breast biopsy Right 1990's    "calcium deposit"  . Cataract extraction w/ intraocular lens  implant, bilateral Bilateral 2001  . Dilation and curettage of uterus      S/P miscarriage  . Permanent pacemaker insertion N/A 12/30/2013    Procedure: PERMANENT PACEMAKER INSERTION;  Surgeon: Evans Lance, MD;  Location: [facility] CATH LAB;  Service: Cardiovascular;  Laterality: N/A;    Allergies  Allergen Reactions  . Codeine Nausea And Vomiting  . Tramadol Nausea And Vomiting  . Sulfa Antibiotics Rash    Current Outpatient Prescriptions on File Prior to Visit  Medication Sig Dispense Refill  . acetaminophen (TYLENOL) 500 MG tablet Take  500 mg by mouth every 6 (six) hours as needed.    Marland Kitchen amLODipine (NORVASC) 10 MG tablet Take 10 mg by mouth daily.    . Calcium Carbonate (CALCIUM 600 PO) Take 600 mg by mouth daily.    . Coenzyme Q10 (CO Q 10) 100 MG CAPS Take 100 mg by mouth daily.    . diphenhydrAMINE (BENADRYL) 25 MG tablet Take 25 mg by mouth at bedtime as needed for sleep.    . fish oil-omega-3 fatty acids 1000 MG capsule Take 1 g by mouth daily.     No current facility-administered medications on file prior to visit.        Objective:   Physical ExamBlood pressure 126/64, pulse 64, temperature 97.5 F (36.4 C), height 5\' 7"  (1.702 m), weight 150 lb 3.2 oz (68.13 kg).  Alert and oriented. Skin warm and dry. Oral mucosa is moist.   . Sclera anicteric, conjunctivae is pink. Thyroid not enlarged. No cervical lymphadenopathy. Lungs clear. Heart regular rate and rhythm.  Abdomen is soft. Bowel sounds are positive. No hepatomegaly. No abdominal masses felt. No tenderness.  No edema to lower extremities.         Assessment & Plan:  Solid food dysphagia. Stricture needs to be ruled out.  EGD/ED.

## 2015-03-02 ENCOUNTER — Encounter (INDEPENDENT_AMBULATORY_CARE_PROVIDER_SITE_OTHER): Payer: Self-pay

## 2015-03-08 ENCOUNTER — Encounter (HOSPITAL_COMMUNITY)
Admission: RE | Disposition: A | Discharge status: Home / Self Care | Payer: Self-pay | Source: Ambulatory Visit | Attending: Internal Medicine

## 2015-03-08 ENCOUNTER — Ambulatory Visit (HOSPITAL_COMMUNITY)
Admission: RE | Admit: 2015-03-08 | Discharge: 2015-03-08 | Disposition: A | Discharge status: Home / Self Care | Payer: Medicare Other | Source: Ambulatory Visit | Attending: Internal Medicine | Admitting: Internal Medicine

## 2015-03-08 ENCOUNTER — Encounter (HOSPITAL_COMMUNITY): Payer: Self-pay | Admitting: *Deleted

## 2015-03-08 DIAGNOSIS — E782 Mixed hyperlipidemia: Secondary | ICD-10-CM | POA: Insufficient documentation

## 2015-03-08 DIAGNOSIS — I10 Essential (primary) hypertension: Secondary | ICD-10-CM | POA: Insufficient documentation

## 2015-03-08 DIAGNOSIS — R1310 Dysphagia, unspecified: Secondary | ICD-10-CM | POA: Diagnosis not present

## 2015-03-08 DIAGNOSIS — Z79899 Other long term (current) drug therapy: Secondary | ICD-10-CM | POA: Diagnosis not present

## 2015-03-08 DIAGNOSIS — K222 Esophageal obstruction: Secondary | ICD-10-CM | POA: Diagnosis not present

## 2015-03-08 DIAGNOSIS — Z96652 Presence of left artificial knee joint: Secondary | ICD-10-CM | POA: Diagnosis not present

## 2015-03-08 DIAGNOSIS — K449 Diaphragmatic hernia without obstruction or gangrene: Secondary | ICD-10-CM | POA: Diagnosis not present

## 2015-03-08 HISTORY — PX: ESOPHAGOGASTRODUODENOSCOPY: SHX5428

## 2015-03-08 HISTORY — PX: ESOPHAGEAL DILATION: SHX303

## 2015-03-08 SURGERY — EGD (ESOPHAGOGASTRODUODENOSCOPY)
Anesthesia: Moderate Sedation

## 2015-03-08 MED ORDER — MIDAZOLAM HCL 5 MG/5ML IJ SOLN
INTRAMUSCULAR | Status: AC
  Filled 2015-03-08: qty 10

## 2015-03-08 MED ORDER — MIDAZOLAM HCL 5 MG/5ML IJ SOLN
INTRAMUSCULAR | Status: DC | PRN
  Administered 2015-03-08 (×2): 1 mg via INTRAVENOUS

## 2015-03-08 MED ORDER — MEPERIDINE HCL 50 MG/ML IJ SOLN
INTRAMUSCULAR | Status: AC
  Filled 2015-03-08: qty 1

## 2015-03-08 MED ORDER — STERILE WATER FOR IRRIGATION IR SOLN
Status: DC | PRN
  Administered 2015-03-08: 2.5 mL

## 2015-03-08 MED ORDER — BUTAMBEN-TETRACAINE-BENZOCAINE 2-2-14 % EX AERO
INHALATION_SPRAY | CUTANEOUS | Status: DC | PRN
  Administered 2015-03-08: 2 via TOPICAL

## 2015-03-08 MED ORDER — SODIUM CHLORIDE 0.9 % IV SOLN
INTRAVENOUS | Status: DC
  Administered 2015-03-08: 1000 mL via INTRAVENOUS

## 2015-03-08 MED ORDER — MEPERIDINE HCL 50 MG/ML IJ SOLN
INTRAMUSCULAR | Status: DC | PRN
  Administered 2015-03-08: 10 mg via INTRAVENOUS
  Administered 2015-03-08: 20 mg via INTRAVENOUS

## 2015-03-08 NOTE — Op Note (Signed)
EGD PROCEDURE REPORT  PATIENT:  Laura Martin [facility]  MR#:  [id number] Birthdate:  Apr 01, 1925, 81 y.o., female Endoscopist:  Dr. Rogene Houston, MD Referred By:  Dr. Jasper Loser. [name], MD Procedure Date: 03/08/2015  Procedure:   EGD with ED  Indications:  Patient is 81 year old Caucasian female was at symptoms of GERD for 1 year now presents with 6 month history of intermittent solid food dysphagia. She has had 3 episodes of food impaction early with regurgitation. She points to lower sternal areas site of bolus obstruction. Heartburn is well controlled with PPI which was started over a month ago. Prior to that she was using Tums on as-needed basis.            Informed Consent:  The risks, benefits, alternatives & imponderables which include, but are not limited to, bleeding, infection, perforation, drug reaction and potential missed lesion have been reviewed.  The potential for biopsy, lesion removal, esophageal dilation, etc. have also been discussed.  Questions have been answered.  All parties agreeable.  Please see history & physical in medical record for more information.  Medications:  Demerol 30 mg IV Versed 2 mg IV Cetacaine spray topically for oropharyngeal anesthesia  Description of procedure:  The endoscope was introduced through the mouth and advanced to the second portion of the duodenum without difficulty or limitations. The mucosal surfaces were surveyed very carefully during advancement of the scope and upon withdrawal.  Findings:  Esophagus:  Mucosa of proximal and middle third was normal. There was thickening to mucosa at distal esophagus with soft stricture at GE junction. GEJ:  42 cm Hiatus:  44 cm Stomach:  Stomach was empty and distended very well with insufflation. Folds in the proximal stomach were normal. Examination of mucosa at gastric body, antrum, pyloric channel, and malaise, fundus and cardia was normal. Duodenum:  Normal bulbar and post bulbar  mucosa.  Therapeutic/Diagnostic Maneuvers Performed:   Esophagus dilated by passing 53 French Maloney dilator to full insertion. As the dilator was withdrawn endoscope was passed again and disruption noted to mucosa at GE junction.  Complications:  None  EBL: Minimal  Impression: Soft stricture at GEJ. Small sliding hiatal hernia. Esophagus dilated by passing 54 French Maloney dilator resulting in disruption to mucosa at GE junction.  Comment: Esophageal motility disorder is also suspected.  Recommendations:  Standard instructions given. Patient advised to eat slowly and chew food thoroughly before swallowing. Patient call office with progress report.  REHMAN,NAJEEB U  03/08/2015  3:35 PM  CC: Dr. Alonza Bogus, MD & Dr. Rayne Du ref. provider found

## 2015-03-08 NOTE — H&P (Signed)
Laura Martin is an 81 y.o. female.   Chief Complaint: Patient is here for EGD and ED. HPI: Patient is 81 year old Caucasian female was at heartburn for about a year who presents with 6 month history of intermittent solid food dysphagia. She has had 3 episodes where she had to regurgitate food bolus in order to get relief. Since she has been on pantoprazole heartburn is well controlled. She denies nausea vomiting abdominal pain melena anorexia or weight loss.  Past Medical History  Diagnosis Date  . Goiter   . Mixed hyperlipidemia   . Essential hypertension, benign   . Spinal stenosis   . Complete heart block ([place])     a. s/p PPM 12/31/13:  Medtronic Adapta L (serial number [id number]) pacemaker  . Thyroid nodule   . Complication of anesthesia   . PONV (postoperative nausea and vomiting)     Past Surgical History  Procedure Laterality Date  . Biopsy thyroid Left 11/2006  . Total knee arthroplasty Left 09/2010  . Insert / replace / remove pacemaker  12/30/2013  . Tonsillectomy  ~ 1943  . Knee arthroscopy Left 1986  . Breast biopsy Right 1990's    "calcium deposit"  . Cataract extraction w/ intraocular lens  implant, bilateral Bilateral 2001  . Dilation and curettage of uterus      S/P miscarriage  . Permanent pacemaker insertion N/A 12/30/2013    Procedure: PERMANENT PACEMAKER INSERTION;  Surgeon: Evans Lance, MD;  Location: [facility] CATH LAB;  Service: Cardiovascular;  Laterality: N/A;    Family History  Problem Relation Age of Onset  . Cancer    . Stroke     Social History:  reports that she has never smoked. She has never used smokeless tobacco. She reports that she does not drink alcohol or use illicit drugs.  Allergies:  Allergies  Allergen Reactions  . Codeine Nausea And Vomiting  . Tramadol Nausea And Vomiting  . Sulfa Antibiotics Rash    Medications Prior to Admission  Medication Sig Dispense Refill  . amLODipine (NORVASC) 10 MG tablet Take 10 mg by mouth daily.     . Calcium Carbonate (CALCIUM 600 PO) Take 600 mg by mouth daily.    . cholecalciferol (VITAMIN D) 400 UNITS TABS tablet Take 400 Units by mouth.    . Coenzyme Q10 (CO Q 10) 100 MG CAPS Take 100 mg by mouth daily.    . fish oil-omega-3 fatty acids 1000 MG capsule Take 1 g by mouth daily.    . pantoprazole (PROTONIX) 40 MG tablet Take 40 mg by mouth daily.    Marland Kitchen acetaminophen (TYLENOL) 500 MG tablet Take 500 mg by mouth every 6 (six) hours as needed.    . diphenhydrAMINE (BENADRYL) 25 MG tablet Take 25 mg by mouth at bedtime as needed for sleep.      No results found for this or any previous visit (from the past 48 hour(s)). No results found.  ROS  Blood pressure 157/80, pulse 72, temperature 98.1 F (36.7 C), temperature source Oral, resp. rate 20, height 5\' 7"  (1.702 m), weight 150 lb (68.04 kg), SpO2 98 %. Physical Exam  Constitutional: She appears well-developed and well-nourished.  HENT:  Mouth/Throat: Oropharynx is clear and moist.  Eyes: Conjunctivae are normal. No scleral icterus.  Neck: No thyromegaly present.  Cardiovascular: Normal rate and regular rhythm.   Murmur heard. Faint SEM at left sternal border.  Respiratory: Effort normal and breath sounds normal.  GI: Soft. She exhibits no distension  and no mass. There is no tenderness.  Musculoskeletal: She exhibits no edema.  Lymphadenopathy:    She has no cervical adenopathy.  Neurological: She is alert.  Skin: Skin is warm and dry.     Assessment/Plan Solid food dysphagia in patient with history of GERD. EGD with ED.  Laura Martin U 03/08/2015, 3:10 PM

## 2015-03-08 NOTE — Discharge Instructions (Signed)
Resume usual medications and diet. Remember to eat slowly and chew your food thoroughly before swallowing. No driving for 24 hours. Please call office with progress report next week.    Esophagogastroduodenoscopy, Care After Refer to this sheet in the next few weeks. These instructions provide you with information about caring for yourself after your procedure. Your health care provider may also give you more specific instructions. Your treatment has been planned according to current medical practices, but problems sometimes occur. Call your health care provider if you have any problems or questions after your procedure. WHAT TO EXPECT AFTER THE PROCEDURE After your procedure, it is typical to feel:  Soreness in your throat.  Pain with swallowing.  Sick to your stomach (nauseous).  Bloated.  Dizzy.  Fatigued. HOME CARE INSTRUCTIONS  Do not eat or drink anything until the numbing medicine (local anesthetic) has worn off and your gag reflex has returned. You will know that the local anesthetic has worn off when you can swallow comfortably.  Do not drive or operate machinery until directed by your health care provider.  Take medicines only as directed by your health care provider. SEEK MEDICAL CARE IF:   You cannot stop coughing.  You are not urinating at all or less than usual. SEEK IMMEDIATE MEDICAL CARE IF:  You have difficulty swallowing.  You cannot eat or drink.  You have worsening throat or chest pain.  You have dizziness or lightheadedness or you faint.  You have nausea or vomiting.  You have chills.  You have a fever.  You have severe abdominal pain.  You have black, tarry, or bloody stools.   This information is not intended to replace advice given to you by your health care provider. Make sure you discuss any questions you have with your health care provider.   Document Released: 02/25/2012 Document Revised: 03/31/2014 Document Reviewed:  02/25/2012 Elsevier Interactive Patient Education [employer].

## 2015-03-13 ENCOUNTER — Telehealth (INDEPENDENT_AMBULATORY_CARE_PROVIDER_SITE_OTHER): Payer: Self-pay | Admitting: *Deleted

## 2015-03-13 NOTE — Telephone Encounter (Signed)
Nice to know that Laura Martin able to swallow better.

## 2015-03-13 NOTE — Telephone Encounter (Signed)
Patient was to call with progress report.  She is doing great.  Thank you

## 2015-03-15 ENCOUNTER — Encounter (HOSPITAL_COMMUNITY): Payer: Self-pay | Admitting: Internal Medicine

## 2015-03-28 DIAGNOSIS — H903 Sensorineural hearing loss, bilateral: Secondary | ICD-10-CM | POA: Diagnosis not present

## 2015-04-03 ENCOUNTER — Ambulatory Visit (INDEPENDENT_AMBULATORY_CARE_PROVIDER_SITE_OTHER): Payer: Medicare Other | Admitting: *Deleted

## 2015-04-03 DIAGNOSIS — I441 Atrioventricular block, second degree: Secondary | ICD-10-CM

## 2015-04-04 NOTE — Progress Notes (Signed)
Remote pacemaker transmission.   

## 2015-04-05 DIAGNOSIS — H6123 Impacted cerumen, bilateral: Secondary | ICD-10-CM | POA: Diagnosis not present

## 2015-04-15 LAB — CUP PACEART REMOTE DEVICE CHECK
Battery Remaining Longevity: 145 mo
Brady Statistic AP VP Percent: 22 %
Brady Statistic AS VS Percent: 1 %
Date Time Interrogation Session: 20170110121343
Implantable Lead Implant Date: 20151009
Implantable Lead Location: 753860
Lead Channel Impedance Value: 582 Ohm
Lead Channel Pacing Threshold Amplitude: 0.5 V
Lead Channel Pacing Threshold Pulse Width: 0.4 ms
Lead Channel Sensing Intrinsic Amplitude: 0.7 mV
Lead Channel Setting Pacing Amplitude: 2 V
Lead Channel Setting Pacing Pulse Width: 0.4 ms
Lead Channel Setting Sensing Sensitivity: 2 mV
MDC IDC LEAD IMPLANT DT: 20151009
MDC IDC LEAD LOCATION: 753859
MDC IDC MSMT BATTERY IMPEDANCE: 110 Ohm
MDC IDC MSMT BATTERY VOLTAGE: 2.8 V
MDC IDC MSMT LEADCHNL RA IMPEDANCE VALUE: 470 Ohm
MDC IDC MSMT LEADCHNL RA PACING THRESHOLD AMPLITUDE: 0.5 V
MDC IDC MSMT LEADCHNL RA PACING THRESHOLD PULSEWIDTH: 0.4 ms
MDC IDC SET LEADCHNL RV PACING AMPLITUDE: 2.5 V
MDC IDC STAT BRADY AP VS PERCENT: 0 %
MDC IDC STAT BRADY AS VP PERCENT: 77 %

## 2015-04-20 ENCOUNTER — Encounter: Payer: Self-pay | Admitting: Cardiology

## 2015-04-24 DIAGNOSIS — N390 Urinary tract infection, site not specified: Secondary | ICD-10-CM | POA: Diagnosis not present

## 2015-07-03 ENCOUNTER — Ambulatory Visit (INDEPENDENT_AMBULATORY_CARE_PROVIDER_SITE_OTHER): Payer: Medicare Other | Admitting: *Deleted

## 2015-07-03 DIAGNOSIS — I441 Atrioventricular block, second degree: Secondary | ICD-10-CM | POA: Diagnosis not present

## 2015-07-03 NOTE — Progress Notes (Signed)
Remote pacemaker transmission.   

## 2015-07-12 DIAGNOSIS — Z950 Presence of cardiac pacemaker: Secondary | ICD-10-CM | POA: Diagnosis not present

## 2015-07-12 DIAGNOSIS — K210 Gastro-esophageal reflux disease with esophagitis: Secondary | ICD-10-CM | POA: Diagnosis not present

## 2015-07-12 DIAGNOSIS — I10 Essential (primary) hypertension: Secondary | ICD-10-CM | POA: Diagnosis not present

## 2015-07-12 DIAGNOSIS — M4806 Spinal stenosis, lumbar region: Secondary | ICD-10-CM | POA: Diagnosis not present

## 2015-08-07 LAB — CUP PACEART REMOTE DEVICE CHECK
Battery Impedance: 111 Ohm
Battery Remaining Longevity: 144 mo
Battery Voltage: 2.8 V
Brady Statistic AP VP Percent: 21 %
Brady Statistic AS VP Percent: 77 %
Date Time Interrogation Session: 20170411105710
Implantable Lead Implant Date: 20151009
Implantable Lead Location: 753859
Implantable Lead Location: 753860
Implantable Lead Model: 5076
Implantable Lead Model: 5076
Lead Channel Impedance Value: 490 Ohm
Lead Channel Pacing Threshold Amplitude: 0.5 V
Lead Channel Sensing Intrinsic Amplitude: 0.7 mV
Lead Channel Setting Pacing Amplitude: 2.5 V
MDC IDC LEAD IMPLANT DT: 20151009
MDC IDC MSMT LEADCHNL RA PACING THRESHOLD PULSEWIDTH: 0.4 ms
MDC IDC MSMT LEADCHNL RV IMPEDANCE VALUE: 555 Ohm
MDC IDC MSMT LEADCHNL RV PACING THRESHOLD AMPLITUDE: 0.625 V
MDC IDC MSMT LEADCHNL RV PACING THRESHOLD PULSEWIDTH: 0.4 ms
MDC IDC SET LEADCHNL RA PACING AMPLITUDE: 2 V
MDC IDC SET LEADCHNL RV PACING PULSEWIDTH: 0.4 ms
MDC IDC SET LEADCHNL RV SENSING SENSITIVITY: 2 mV
MDC IDC STAT BRADY AP VS PERCENT: 0 %
MDC IDC STAT BRADY AS VS PERCENT: 1 %

## 2015-08-08 ENCOUNTER — Encounter: Payer: Self-pay | Admitting: Cardiology

## 2015-08-10 DIAGNOSIS — N390 Urinary tract infection, site not specified: Secondary | ICD-10-CM | POA: Diagnosis not present

## 2015-08-15 DIAGNOSIS — N390 Urinary tract infection, site not specified: Secondary | ICD-10-CM | POA: Diagnosis not present

## 2015-10-02 ENCOUNTER — Ambulatory Visit (INDEPENDENT_AMBULATORY_CARE_PROVIDER_SITE_OTHER): Payer: Medicare Other | Admitting: *Deleted

## 2015-10-02 DIAGNOSIS — I441 Atrioventricular block, second degree: Secondary | ICD-10-CM | POA: Diagnosis not present

## 2015-10-02 NOTE — Progress Notes (Signed)
Remote pacemaker transmission.   

## 2015-10-05 ENCOUNTER — Encounter: Payer: Self-pay | Admitting: Cardiology

## 2015-10-05 LAB — CUP PACEART REMOTE DEVICE CHECK
Battery Remaining Longevity: 135 mo
Battery Voltage: 2.8 V
Brady Statistic AS VP Percent: 79 %
Implantable Lead Model: 5076
Implantable Lead Model: 5076
Lead Channel Pacing Threshold Amplitude: 0.5 V
Lead Channel Pacing Threshold Pulse Width: 0.4 ms
Lead Channel Setting Pacing Amplitude: 2.5 V
Lead Channel Setting Sensing Sensitivity: 2 mV
MDC IDC LEAD IMPLANT DT: 20151009
MDC IDC LEAD IMPLANT DT: 20151009
MDC IDC LEAD LOCATION: 753859
MDC IDC LEAD LOCATION: 753860
MDC IDC MSMT BATTERY IMPEDANCE: 111 Ohm
MDC IDC MSMT LEADCHNL RA IMPEDANCE VALUE: 438 Ohm
MDC IDC MSMT LEADCHNL RA PACING THRESHOLD PULSEWIDTH: 0.4 ms
MDC IDC MSMT LEADCHNL RA SENSING INTR AMPL: 2 mV
MDC IDC MSMT LEADCHNL RV IMPEDANCE VALUE: 593 Ohm
MDC IDC MSMT LEADCHNL RV PACING THRESHOLD AMPLITUDE: 0.5 V
MDC IDC SESS DTM: 20170711111113
MDC IDC SET LEADCHNL RA PACING AMPLITUDE: 2 V
MDC IDC SET LEADCHNL RV PACING PULSEWIDTH: 0.4 ms
MDC IDC STAT BRADY AP VP PERCENT: 20 %
MDC IDC STAT BRADY AP VS PERCENT: 0 %
MDC IDC STAT BRADY AS VS PERCENT: 1 %

## 2015-10-30 DIAGNOSIS — M545 Low back pain: Secondary | ICD-10-CM | POA: Diagnosis not present

## 2015-10-30 DIAGNOSIS — M5416 Radiculopathy, lumbar region: Secondary | ICD-10-CM | POA: Diagnosis not present

## 2015-10-30 DIAGNOSIS — M4806 Spinal stenosis, lumbar region: Secondary | ICD-10-CM | POA: Diagnosis not present

## 2015-10-30 DIAGNOSIS — M47816 Spondylosis without myelopathy or radiculopathy, lumbar region: Secondary | ICD-10-CM | POA: Diagnosis not present

## 2015-11-06 DIAGNOSIS — Z961 Presence of intraocular lens: Secondary | ICD-10-CM | POA: Diagnosis not present

## 2015-11-21 ENCOUNTER — Other Ambulatory Visit: Payer: Self-pay

## 2016-01-11 DIAGNOSIS — I10 Essential (primary) hypertension: Secondary | ICD-10-CM | POA: Diagnosis not present

## 2016-01-11 DIAGNOSIS — Z950 Presence of cardiac pacemaker: Secondary | ICD-10-CM | POA: Diagnosis not present

## 2016-01-11 DIAGNOSIS — K219 Gastro-esophageal reflux disease without esophagitis: Secondary | ICD-10-CM | POA: Diagnosis not present

## 2016-01-11 DIAGNOSIS — M48061 Spinal stenosis, lumbar region without neurogenic claudication: Secondary | ICD-10-CM | POA: Diagnosis not present

## 2016-01-14 ENCOUNTER — Encounter: Payer: Self-pay | Admitting: Internal Medicine

## 2016-01-14 ENCOUNTER — Ambulatory Visit (INDEPENDENT_AMBULATORY_CARE_PROVIDER_SITE_OTHER): Payer: Medicare Other | Admitting: Internal Medicine

## 2016-01-14 VITALS — BP 158/72 | HR 80 | Ht 67.0 in | Wt 153.0 lb

## 2016-01-14 DIAGNOSIS — Z950 Presence of cardiac pacemaker: Secondary | ICD-10-CM

## 2016-01-14 DIAGNOSIS — I441 Atrioventricular block, second degree: Secondary | ICD-10-CM | POA: Diagnosis not present

## 2016-01-14 NOTE — Progress Notes (Signed)
HPI Mrs. Braver returns today for ongoing management of her PPM. She is a very pleasant 82 yo woman with symptomatic bradycardia. She has had persistent symptoms and found on cardiac monitoring to have daytime periods of high grade AV block with 2:1 heart block in the setting of RBBB. She underwent insertion of a DDD PM 2 years ago. No syncope. She is improved. She remains active walking her dog although she has been bothered by arthritis in her hip. Allergies  Allergen Reactions  . Codeine Nausea And Vomiting  . Tramadol Nausea And Vomiting  . Sulfa Antibiotics Rash     Current Outpatient Prescriptions  Medication Sig Dispense Refill  . acetaminophen (TYLENOL) 500 MG tablet Take 500 mg by mouth every 6 (six) hours as needed.    Marland Kitchen amLODipine (NORVASC) 10 MG tablet Take 10 mg by mouth daily.    . Calcium Carbonate (CALCIUM 600 PO) Take 600 mg by mouth daily.    . cholecalciferol (VITAMIN D) 400 UNITS TABS tablet Take 400 Units by mouth.    . Coenzyme Q10 (CO Q 10) 100 MG CAPS Take 100 mg by mouth daily.    . diphenhydrAMINE (BENADRYL) 25 MG tablet Take 25 mg by mouth at bedtime as needed for sleep.    . fish oil-omega-3 fatty acids 1000 MG capsule Take 1 g by mouth daily.    . pantoprazole (PROTONIX) 40 MG tablet Take 40 mg by mouth daily.     No current facility-administered medications for this visit.      Past Medical History:  Diagnosis Date  . Complete heart block ([place])    a. s/p PPM 12/31/13:  Medtronic Adapta L (serial number [id number]) pacemaker  . Complication of anesthesia   . Essential hypertension, benign   . Goiter   . Mixed hyperlipidemia   . PONV (postoperative nausea and vomiting)   . Spinal stenosis   . Thyroid nodule     ROS:   All systems reviewed and negative except as noted in the HPI.   Past Surgical History:  Procedure Laterality Date  . BIOPSY THYROID Left 11/2006  . BREAST BIOPSY Right 1990's   "calcium deposit"  . CATARACT EXTRACTION  W/ INTRAOCULAR LENS  IMPLANT, BILATERAL Bilateral 2001  . DILATION AND CURETTAGE OF UTERUS     S/P miscarriage  . ESOPHAGEAL DILATION N/A 03/08/2015   Procedure: ESOPHAGEAL DILATION;  Surgeon: Rogene Houston, MD;  Location: [facility] ENDO SUITE;  Service: Endoscopy;  Laterality: N/A;  . ESOPHAGOGASTRODUODENOSCOPY N/A 03/08/2015   Procedure: ESOPHAGOGASTRODUODENOSCOPY (EGD);  Surgeon: Rogene Houston, MD;  Location: [facility] ENDO SUITE;  Service: Endoscopy;  Laterality: N/A;  3:00  . INSERT / REPLACE / REMOVE PACEMAKER  12/30/2013  . KNEE ARTHROSCOPY Left 1986  . PERMANENT PACEMAKER INSERTION N/A 12/30/2013   Procedure: PERMANENT PACEMAKER INSERTION;  Surgeon: Evans Lance, MD;  Location: [facility] CATH LAB;  Service: Cardiovascular;  Laterality: N/A;  . TONSILLECTOMY  ~ 1943  . TOTAL KNEE ARTHROPLASTY Left 09/2010     Family History  Problem Relation Age of Onset  . Cancer    . Stroke       Social History   Social History  . Marital status: Married    Spouse name: N/A  . Number of children: N/A  . Years of education: N/A   Occupational History  . Not on file.   Social History Main Topics  . Smoking status: Never Smoker  . Smokeless tobacco: Never Used  .  Alcohol use No  . Drug use: No  . Sexual activity: Not on file   Other Topics Concern  . Not on file   Social History Narrative  . No narrative on file     BP (!) 158/72   Pulse 80   Ht 5\' 7"  (1.702 m)   Wt 153 lb (69.4 kg)   SpO2 98%   BMI 23.96 kg/m   Physical Exam:  Well appearing elderly woman, NAD HEENT: Unremarkable Neck:  No JVD, no thyromegally Back:  No CVA tenderness Lungs:  Clear with no wheezes HEART:  Regular brady rhythm, no murmurs, no rubs, no clicks Abd:  soft, positive bowel sounds, no organomegally, no rebound, no guarding Ext:  2 plus pulses, no edema, no cyanosis, no clubbing Skin:  No rashes no nodules Neuro:  CN II through XII intact, motor grossly intact  PM interogation - normal device  function.  Assess/Plan: 1. Symptomatic 2:1 AV block - she is now asymptomatic, s/p PPM 2. PPM - her Medtronic DDD PM is working normally. Will recheck in several months. 3. HTN - her blood pressure is a bit high due to chronic hip pain. Usually better at home and when not in pain.  Mikle Bosworth.D.

## 2016-01-14 NOTE — Patient Instructions (Signed)

## 2016-02-11 NOTE — Progress Notes (Signed)
Cardiology Office Note  Date: 02/13/2016   ID: Laura Martin, DOB [birth year], MRN [id number]  PCP: Alonza Bogus, MD  Primary Cardiologist: Rozann Lesches, MD   Chief Complaint  Patient presents with  . Hypertension    History of Present Illness: Laura Martin is an 82 y.o. female last seen in November 2016. She presents for a routine follow-up visit. Reports no major health changes. Still able to handle her ADLs. In fact, she is cooking although vegetables, dressing and gravy for upcoming Thanksgiving dinner with her family. She has had some mild vestibular problems and balance, no palpitations or syncope.  She continues to follow with Dr. Lovena Le in the device clinic, seen recently in October with Medtronic pacemaker in place.  I reviewed her ECG today which shows ventricular pacing with atrial sensing.  I reviewed her medications which are outlined below. Systolic blood pressure in the 150s today. She reports compliance with Norvasc.  Also continues to follow with Dr. [name] every 6 months.  Past Medical History:  Diagnosis Date  . Complete heart block ([place])    a. s/p PPM 12/31/13:  Medtronic Adapta L (serial number [id number]) pacemaker  . Complication of anesthesia   . Essential hypertension, benign   . Goiter   . Mixed hyperlipidemia   . PONV (postoperative nausea and vomiting)   . Spinal stenosis   . Thyroid nodule     Past Surgical History:  Procedure Laterality Date  . BIOPSY THYROID Left 11/2006  . BREAST BIOPSY Right 1990's   "calcium deposit"  . CATARACT EXTRACTION W/ INTRAOCULAR LENS  IMPLANT, BILATERAL Bilateral 2001  . DILATION AND CURETTAGE OF UTERUS     S/P miscarriage  . ESOPHAGEAL DILATION N/A 03/08/2015   Procedure: ESOPHAGEAL DILATION;  Surgeon: Rogene Houston, MD;  [facility];  Service: Endoscopy;  Laterality: N/A;  . ESOPHAGOGASTRODUODENOSCOPY N/A 03/08/2015   Procedure: ESOPHAGOGASTRODUODENOSCOPY (EGD);  Surgeon:  Rogene Houston, MD;  [facility];  Service: Endoscopy;  Laterality: N/A;  3:00  . INSERT / REPLACE / REMOVE PACEMAKER  12/30/2013  . KNEE ARTHROSCOPY Left 1986  . PERMANENT PACEMAKER INSERTION N/A 12/30/2013   Procedure: PERMANENT PACEMAKER INSERTION;  Surgeon: Evans Lance, MD;  Location: [facility] CATH LAB;  Service: Cardiovascular;  Laterality: N/A;  . TONSILLECTOMY  ~ 1943  . TOTAL KNEE ARTHROPLASTY Left 09/2010    Current Outpatient Prescriptions  Medication Sig Dispense Refill  . acetaminophen (TYLENOL) 500 MG tablet Take 500 mg by mouth every 6 (six) hours as needed.    Marland Kitchen amLODipine (NORVASC) 10 MG tablet Take 10 mg by mouth daily.    . Calcium Carbonate (CALCIUM 600 PO) Take 600 mg by mouth daily. Takes 3 tabs daily    . cholecalciferol (VITAMIN D) 400 UNITS TABS tablet Take 400 Units by mouth.    . Coenzyme Q10 (CO Q 10) 100 MG CAPS Take 100 mg by mouth daily.    . diphenhydrAMINE (BENADRYL) 25 MG tablet Take 25 mg by mouth at bedtime as needed for sleep.    . fish oil-omega-3 fatty acids 1000 MG capsule Take 1 g by mouth daily.    . naproxen sodium (ANAPROX) 220 MG tablet Take 220 mg by mouth at bedtime as needed.    . pantoprazole (PROTONIX) 40 MG tablet Take 40 mg by mouth daily.     No current facility-administered medications for this visit.    Allergies:  Codeine; Tramadol; and Sulfa antibiotics  Social History: The patient  reports that she has never smoked. She has never used smokeless tobacco. She reports that she does not drink alcohol or use drugs.   ROS:  Please see the history of present illness. Otherwise, complete review of systems is positive for arthritic stiffness.  All other systems are reviewed and negative.   Physical Exam: VS:  BP (!) 152/80   Pulse 89   Ht 5\' 7"  (1.702 m)   Wt 149 lb (67.6 kg)   SpO2 98%   BMI 23.34 kg/m , BMI Body mass index is 23.34 kg/m.  Wt Readings from Last 3 Encounters:  02/13/16 149 lb (67.6 kg)  01/14/16 153 lb  (69.4 kg)  03/08/15 150 lb (68 kg)    Elderly woman, appears comfortable at rest. HEENT: Conjunctiva and lids normal, oropharynx clear.  Neck: Supple, no elevated JVP or carotid bruits, no thyromegaly.  Lungs: Clear to auscultation, nonlabored breathing at rest.  Cardiac: Regular rate and rhythm, no S3, 2/6 systolic murmur, no pericardial rub.  Abdomen: Soft, nontender, bowel sounds present. Skin: Warm and dry. Musculoskeletal: Mild kyphosis. Extremities: No pitting edema, distal pulses 1-2+. Neuropsychiatric: Alert oriented 3, affect appropriate.   ECG: I personally reviewed the tracing from 01/01/2015 which showed a ventricular paced rhythm with atrial tracking.  Recent Labwork:  April 2016: Potassium 4.2, BUN 15, creatinine 0.7, hemoglobin 13.4, platelets 295, cholesterol 197, triglycerides 112, HDL 64, LDL 111, AST 19, ALT 16, TSH 0.82  Other Studies Reviewed Today:  Echocardiogram 11/16/2013: Study Conclusions  - Left ventricle: The cavity size was normal. Wall thickness was increased in a pattern of mild LVH. Systolic function was normal. The estimated ejection fraction was in the range of 60% to 65%. Wall motion was normal; there were no regional wall motion abnormalities. Doppler parameters are consistent with abnormal left ventricular relaxation (grade 1 diastolic dysfunction). Doppler parameters are consistent with high ventricular filling pressure. - Mitral valve: Mildly thickened leaflets . There was trivial regurgitation. - Tricuspid valve: There was mild regurgitation. - Pulmonic valve: There was mild regurgitation. - Pulmonary arteries: PA peak pressure: 33 mm Hg (S).  Assessment and Plan:  1. Essential hypertension, systolic blood pressure in the 150s today. She will continue on Norvasc, discussed limited salt in her diet. Keep follow with Dr. [name].  2. History of complete heart block status post Medtronic pacemaker. She follows with  Dr. Lovena Le. ECG shows ventricular paced rhythm with atrial tracking.  3. Intermittent trouble with balance and vestibular symptoms. Does not sound orthostatic induced.  4. Hyperlipidemia, follows with Dr. [name] on coenzyme Q10 and omega-3 supplements.  Current medicines were reviewed with the patient today.   Orders Placed This Encounter  Procedures  . EKG 12-Lead    Disposition: Follow-up in one year.  Signed, Satira Sark, MD, [facility] 02/13/2016 11:06 AM    [facility]. [address], [place], [place] [address] Phone: [phone]; Fax: [phone]

## 2016-02-13 ENCOUNTER — Encounter: Payer: Self-pay | Admitting: Cardiology

## 2016-02-13 ENCOUNTER — Ambulatory Visit (INDEPENDENT_AMBULATORY_CARE_PROVIDER_SITE_OTHER): Payer: Medicare Other | Admitting: Cardiology

## 2016-02-13 VITALS — BP 152/80 | HR 89 | Ht 67.0 in | Wt 149.0 lb

## 2016-02-13 DIAGNOSIS — E782 Mixed hyperlipidemia: Secondary | ICD-10-CM

## 2016-02-13 DIAGNOSIS — I442 Atrioventricular block, complete: Secondary | ICD-10-CM | POA: Diagnosis not present

## 2016-02-13 DIAGNOSIS — Z950 Presence of cardiac pacemaker: Secondary | ICD-10-CM

## 2016-02-13 DIAGNOSIS — I10 Essential (primary) hypertension: Secondary | ICD-10-CM

## 2016-02-13 NOTE — Patient Instructions (Signed)
Your physician wants you to follow-up in: 1 year Dr McDowell You will receive a reminder letter in the mail two months in advance. If you don't receive a letter, please call our office to schedule the follow-up appointment.    Your physician recommends that you continue on your current medications as directed. Please refer to the Current Medication list given to you today.    Thank you for choosing [facility] !         

## 2016-02-22 DIAGNOSIS — N390 Urinary tract infection, site not specified: Secondary | ICD-10-CM | POA: Diagnosis not present

## 2016-04-02 DIAGNOSIS — H903 Sensorineural hearing loss, bilateral: Secondary | ICD-10-CM | POA: Diagnosis not present

## 2016-04-02 DIAGNOSIS — R42 Dizziness and giddiness: Secondary | ICD-10-CM | POA: Diagnosis not present

## 2016-04-14 ENCOUNTER — Ambulatory Visit (INDEPENDENT_AMBULATORY_CARE_PROVIDER_SITE_OTHER): Payer: Medicare Other | Admitting: *Deleted

## 2016-04-14 DIAGNOSIS — I442 Atrioventricular block, complete: Secondary | ICD-10-CM

## 2016-04-14 NOTE — Progress Notes (Signed)
Remote pacemaker transmission.   

## 2016-04-15 LAB — CUP PACEART REMOTE DEVICE CHECK
Battery Impedance: 134 Ohm
Battery Voltage: 2.8 V
Brady Statistic AP VS Percent: 0 %
Brady Statistic AS VP Percent: 70 %
Implantable Lead Location: 753859
Implantable Lead Model: 5076
Implantable Lead Model: 5076
Lead Channel Impedance Value: 444 Ohm
Lead Channel Pacing Threshold Amplitude: 0.5 V
Lead Channel Pacing Threshold Pulse Width: 0.4 ms
Lead Channel Setting Pacing Amplitude: 2.5 V
Lead Channel Setting Pacing Pulse Width: 0.4 ms
MDC IDC LEAD IMPLANT DT: 20151009
MDC IDC LEAD IMPLANT DT: 20151009
MDC IDC LEAD LOCATION: 753860
MDC IDC MSMT BATTERY REMAINING LONGEVITY: 126 mo
MDC IDC MSMT LEADCHNL RV IMPEDANCE VALUE: 554 Ohm
MDC IDC MSMT LEADCHNL RV PACING THRESHOLD AMPLITUDE: 0.625 V
MDC IDC MSMT LEADCHNL RV PACING THRESHOLD PULSEWIDTH: 0.4 ms
MDC IDC PG IMPLANT DT: 20151009
MDC IDC SESS DTM: 20180122131546
MDC IDC SET LEADCHNL RA PACING AMPLITUDE: 2 V
MDC IDC SET LEADCHNL RV SENSING SENSITIVITY: 2 mV
MDC IDC STAT BRADY AP VP PERCENT: 28 %
MDC IDC STAT BRADY AS VS PERCENT: 1 %

## 2016-04-16 ENCOUNTER — Encounter: Payer: Self-pay | Admitting: Cardiology

## 2016-04-16 NOTE — Progress Notes (Signed)
Letter  

## 2016-07-11 DIAGNOSIS — I10 Essential (primary) hypertension: Secondary | ICD-10-CM | POA: Diagnosis not present

## 2016-07-11 DIAGNOSIS — Z950 Presence of cardiac pacemaker: Secondary | ICD-10-CM | POA: Diagnosis not present

## 2016-07-11 DIAGNOSIS — K210 Gastro-esophageal reflux disease with esophagitis: Secondary | ICD-10-CM | POA: Diagnosis not present

## 2016-07-11 DIAGNOSIS — J309 Allergic rhinitis, unspecified: Secondary | ICD-10-CM | POA: Diagnosis not present

## 2016-07-11 DIAGNOSIS — M48061 Spinal stenosis, lumbar region without neurogenic claudication: Secondary | ICD-10-CM | POA: Diagnosis not present

## 2016-07-11 DIAGNOSIS — M1711 Unilateral primary osteoarthritis, right knee: Secondary | ICD-10-CM | POA: Diagnosis not present

## 2016-07-14 ENCOUNTER — Ambulatory Visit (INDEPENDENT_AMBULATORY_CARE_PROVIDER_SITE_OTHER): Payer: Medicare Other | Admitting: *Deleted

## 2016-07-14 DIAGNOSIS — I442 Atrioventricular block, complete: Secondary | ICD-10-CM

## 2016-07-14 NOTE — Progress Notes (Signed)
Remote pacemaker transmission.   

## 2016-07-17 ENCOUNTER — Encounter: Payer: Self-pay | Admitting: Cardiology

## 2016-07-17 LAB — CUP PACEART REMOTE DEVICE CHECK
Battery Voltage: 2.8 V
Brady Statistic AP VS Percent: 0 %
Brady Statistic AS VP Percent: 71 %
Brady Statistic AS VS Percent: 1 %
Implantable Lead Implant Date: 20151009
Implantable Lead Location: 753859
Implantable Lead Model: 5076
Lead Channel Impedance Value: 476 Ohm
Lead Channel Pacing Threshold Amplitude: 0.5 V
Lead Channel Setting Pacing Amplitude: 2 V
Lead Channel Setting Pacing Amplitude: 2.5 V
Lead Channel Setting Pacing Pulse Width: 0.4 ms
Lead Channel Setting Sensing Sensitivity: 2 mV
MDC IDC LEAD IMPLANT DT: 20151009
MDC IDC LEAD LOCATION: 753860
MDC IDC MSMT BATTERY IMPEDANCE: 158 Ohm
MDC IDC MSMT BATTERY REMAINING LONGEVITY: 121 mo
MDC IDC MSMT LEADCHNL RA PACING THRESHOLD PULSEWIDTH: 0.4 ms
MDC IDC MSMT LEADCHNL RV IMPEDANCE VALUE: 542 Ohm
MDC IDC MSMT LEADCHNL RV PACING THRESHOLD AMPLITUDE: 0.75 V
MDC IDC MSMT LEADCHNL RV PACING THRESHOLD PULSEWIDTH: 0.4 ms
MDC IDC PG IMPLANT DT: 20151009
MDC IDC SESS DTM: 20180423121342
MDC IDC STAT BRADY AP VP PERCENT: 28 %

## 2016-10-13 ENCOUNTER — Ambulatory Visit (INDEPENDENT_AMBULATORY_CARE_PROVIDER_SITE_OTHER): Payer: Medicare Other | Admitting: *Deleted

## 2016-10-13 DIAGNOSIS — I442 Atrioventricular block, complete: Secondary | ICD-10-CM

## 2016-10-14 NOTE — Progress Notes (Signed)
Remote pacemaker transmission.   

## 2016-10-16 ENCOUNTER — Encounter: Payer: Self-pay | Admitting: Cardiology

## 2016-10-20 LAB — CUP PACEART REMOTE DEVICE CHECK
Battery Impedance: 158 Ohm
Battery Remaining Longevity: 123 mo
Battery Voltage: 2.8 V
Brady Statistic AS VP Percent: 71 %
Implantable Lead Implant Date: 20151009
Implantable Lead Location: 753859
Implantable Lead Location: 753860
Implantable Lead Model: 5076
Implantable Pulse Generator Implant Date: 20151009
Lead Channel Impedance Value: 498 Ohm
Lead Channel Pacing Threshold Amplitude: 0.5 V
Lead Channel Pacing Threshold Pulse Width: 0.4 ms
Lead Channel Setting Pacing Amplitude: 2 V
Lead Channel Setting Pacing Amplitude: 2.5 V
Lead Channel Setting Pacing Pulse Width: 0.4 ms
MDC IDC LEAD IMPLANT DT: 20151009
MDC IDC MSMT LEADCHNL RA PACING THRESHOLD PULSEWIDTH: 0.4 ms
MDC IDC MSMT LEADCHNL RV IMPEDANCE VALUE: 591 Ohm
MDC IDC MSMT LEADCHNL RV PACING THRESHOLD AMPLITUDE: 0.625 V
MDC IDC SESS DTM: 20180723143319
MDC IDC SET LEADCHNL RV SENSING SENSITIVITY: 2 mV
MDC IDC STAT BRADY AP VP PERCENT: 29 %
MDC IDC STAT BRADY AP VS PERCENT: 0 %
MDC IDC STAT BRADY AS VS PERCENT: 1 %

## 2016-11-11 DIAGNOSIS — Z961 Presence of intraocular lens: Secondary | ICD-10-CM | POA: Diagnosis not present

## 2016-11-11 DIAGNOSIS — H04123 Dry eye syndrome of bilateral lacrimal glands: Secondary | ICD-10-CM | POA: Diagnosis not present

## 2017-01-12 ENCOUNTER — Encounter: Payer: Medicare Other | Admitting: *Deleted

## 2017-01-12 ENCOUNTER — Telehealth: Payer: Self-pay | Admitting: Cardiology

## 2017-01-12 DIAGNOSIS — M1711 Unilateral primary osteoarthritis, right knee: Secondary | ICD-10-CM | POA: Diagnosis not present

## 2017-01-12 DIAGNOSIS — I10 Essential (primary) hypertension: Secondary | ICD-10-CM | POA: Diagnosis not present

## 2017-01-12 DIAGNOSIS — Z950 Presence of cardiac pacemaker: Secondary | ICD-10-CM | POA: Diagnosis not present

## 2017-01-12 DIAGNOSIS — K210 Gastro-esophageal reflux disease with esophagitis: Secondary | ICD-10-CM | POA: Diagnosis not present

## 2017-01-12 NOTE — Telephone Encounter (Signed)
Spoke with pt and reminded pt of remote transmission that is due today. Pt verbalized understanding.   

## 2017-01-15 ENCOUNTER — Encounter: Payer: Self-pay | Admitting: Cardiology

## 2017-01-15 ENCOUNTER — Encounter: Payer: Self-pay | Admitting: Internal Medicine

## 2017-01-15 ENCOUNTER — Ambulatory Visit (INDEPENDENT_AMBULATORY_CARE_PROVIDER_SITE_OTHER): Payer: Medicare Other | Admitting: Internal Medicine

## 2017-01-15 VITALS — BP 148/70 | HR 78 | Ht 67.0 in | Wt 153.0 lb

## 2017-01-15 DIAGNOSIS — I10 Essential (primary) hypertension: Secondary | ICD-10-CM

## 2017-01-15 DIAGNOSIS — I442 Atrioventricular block, complete: Secondary | ICD-10-CM

## 2017-01-15 NOTE — Progress Notes (Signed)
HPI [name] returns today for ongoing evaluation and management of her symptomatic 2:1 heart block, status post permanent pacemaker insertion. In the interim she has done well except for problems with worsening pain in her right knee. She is able walk without difficulty except for knee pain. She denies chest pain or shortness of breath. No peripheral edema. She is considering knee replacement surgery. Allergies  Allergen Reactions  . Codeine Nausea And Vomiting  . Tramadol Nausea And Vomiting  . Sulfa Antibiotics Rash     Current Outpatient Prescriptions  Medication Sig Dispense Refill  . acetaminophen (TYLENOL) 500 MG tablet Take 500 mg by mouth every 6 (six) hours as needed.    Marland Kitchen amLODipine (NORVASC) 10 MG tablet Take 10 mg by mouth daily.    . Calcium Carbonate (CALCIUM 600 PO) Take 600 mg by mouth daily. Takes 3 tabs daily    . cholecalciferol (VITAMIN D) 400 UNITS TABS tablet Take 400 Units by mouth.    . Coenzyme Q10 (CO Q 10) 100 MG CAPS Take 100 mg by mouth daily.    . diphenhydrAMINE (BENADRYL) 25 MG tablet Take 25 mg by mouth at bedtime as needed for sleep.    . fish oil-omega-3 fatty acids 1000 MG capsule Take 1 g by mouth daily.    . naproxen sodium (ANAPROX) 220 MG tablet Take 220 mg by mouth at bedtime as needed.    . pantoprazole (PROTONIX) 40 MG tablet Take 40 mg by mouth daily.     No current facility-administered medications for this visit.      Past Medical History:  Diagnosis Date  . Complete heart block ([place])    a. s/p PPM 12/31/13:  Medtronic Adapta L (serial number [id number]) pacemaker  . Complication of anesthesia   . Essential hypertension, benign   . Goiter   . Mixed hyperlipidemia   . PONV (postoperative nausea and vomiting)   . Spinal stenosis   . Thyroid nodule     ROS:   All systems reviewed and negative except as noted in the HPI.   Past Surgical History:  Procedure Laterality Date  . BIOPSY THYROID Left 11/2006  . BREAST  BIOPSY Right 1990's   "calcium deposit"  . CATARACT EXTRACTION W/ INTRAOCULAR LENS  IMPLANT, BILATERAL Bilateral 2001  . DILATION AND CURETTAGE OF UTERUS     S/P miscarriage  . ESOPHAGEAL DILATION N/A 03/08/2015   Procedure: ESOPHAGEAL DILATION;  Surgeon: Rogene Houston, MD;  [facility];  Service: Endoscopy;  Laterality: N/A;  . ESOPHAGOGASTRODUODENOSCOPY N/A 03/08/2015   Procedure: ESOPHAGOGASTRODUODENOSCOPY (EGD);  Surgeon: Rogene Houston, MD;  [facility];  Service: Endoscopy;  Laterality: N/A;  3:00  . INSERT / REPLACE / REMOVE PACEMAKER  12/30/2013  . KNEE ARTHROSCOPY Left 1986  . PERMANENT PACEMAKER INSERTION N/A 12/30/2013   Procedure: PERMANENT PACEMAKER INSERTION;  Surgeon: Evans Lance, MD;  Location: [facility] CATH LAB;  Service: Cardiovascular;  Laterality: N/A;  . TONSILLECTOMY  ~ 1943  . TOTAL KNEE ARTHROPLASTY Left 09/2010     Family History  Problem Relation Age of Onset  . Cancer Unknown   . Stroke Unknown      Social History   Social History  . Marital status: Married    Spouse name: N/A  . Number of children: N/A  . Years of education: N/A   Occupational History  . Not on file.   Social History Main Topics  . Smoking status: Never  Smoker  . Smokeless tobacco: Never Used  . Alcohol use No  . Drug use: No  . Sexual activity: Not on file   Other Topics Concern  . Not on file   Social History Narrative  . No narrative on file     BP (!) 148/70 (BP Location: Right Arm)   Pulse 78   Ht 5\' 7"  (1.702 m)   Wt 153 lb (69.4 kg)   SpO2 97%   BMI 23.96 kg/m   Physical Exam:  Well appearing 83 year old woman, who looks younger than her stated age,NAD HEENT: Unremarkable Neck:  6 cm JVD, no thyromegally Lymphatics:  No adenopathy Back:  No CVA tenderness Lungs:  Clear, with no wheezes, rales, or rhonchi, well-healed pacemaker incision HEART:  Regular rate rhythm, no murmurs, no rubs, no clicks Abd:  soft, positive bowel  sounds, no organomegally, no rebound, no guarding Ext:  2 plus pulses, no edema, no cyanosis, no clubbing Skin:  No rashes no nodules Neuro:  CN II through XII intact, motor grossly intact   DEVICE  Normal device function.  See PaceArt for details.   Assess/Plan: 1. High-grade heart block - she is 2:1 heart block today. She is asymptomatic status post pacemaker insertion 2. Pacemaker - her Medtronic dual-chamber pacemaker is working normally. We'll recheck in several months. 3. Hypertension -her blood pressure is reasonably well controlled. She will continue her current medical therapy. She is encouraged to maintain a low-sodium diet. 4. Preoperative evaluation. - The patient is pending possible right knee replacement surgery. She is a low surgical risk. She may proceed with knee replacement if the decision is made to do so.  Cristopher Peru, M.D.

## 2017-01-15 NOTE — Patient Instructions (Signed)
Medication Instructions:  Your physician recommends that you continue on your current medications as directed. Please refer to the Current Medication list given to you today.   Labwork: NONE   Testing/Procedures: NONE   Follow-Up: Your physician wants you to follow-up in: 1 Year. You will receive a reminder letter in the mail two months in advance. If you don't receive a letter, please call our office to schedule the follow-up appointment.  Remote monitoring is used to monitor your Pacemaker of ICD from home. This monitoring reduces the number of office visits required to check your device to one time per year. It allows [place] to keep an eye on the functioning of your device to ensure it is working properly. You are scheduled for a device check from home on 04/16/17. You may send your transmission at any time that day. If you have a wireless device, the transmission will be sent automatically. After your physician reviews your transmission, you will receive a postcard with your next transmission date.   Any Other Special Instructions Will Be Listed Below (If Applicable).     If you need a refill on your cardiac medications before your next appointment, please call your pharmacy.

## 2017-01-19 DIAGNOSIS — M1711 Unilateral primary osteoarthritis, right knee: Secondary | ICD-10-CM | POA: Diagnosis not present

## 2017-01-30 LAB — CUP PACEART INCLINIC DEVICE CHECK
Brady Statistic AP VP Percent: 29.7 %
Brady Statistic AP VS Percent: 0.1 % — CL
Brady Statistic AS VP Percent: 69.9 %
Brady Statistic AS VS Percent: 0.7 %
Implantable Lead Implant Date: 20151009
Implantable Lead Location: 753860
Implantable Lead Model: 5076
Lead Channel Impedance Value: 531 Ohm
Lead Channel Pacing Threshold Amplitude: 0.5 V
Lead Channel Sensing Intrinsic Amplitude: 1 mV
Lead Channel Sensing Intrinsic Amplitude: 15.68 mV
MDC IDC LEAD IMPLANT DT: 20151009
MDC IDC LEAD LOCATION: 753859
MDC IDC MSMT BATTERY IMPEDANCE: 182 Ohm
MDC IDC MSMT BATTERY REMAINING LONGEVITY: 114 mo
MDC IDC MSMT BATTERY VOLTAGE: 2.8 V
MDC IDC MSMT LEADCHNL RA IMPEDANCE VALUE: 457 Ohm
MDC IDC MSMT LEADCHNL RA PACING THRESHOLD PULSEWIDTH: 0.4 ms
MDC IDC MSMT LEADCHNL RV PACING THRESHOLD AMPLITUDE: 0.5 V
MDC IDC MSMT LEADCHNL RV PACING THRESHOLD PULSEWIDTH: 0.4 ms
MDC IDC PG IMPLANT DT: 20151009
MDC IDC SESS DTM: 20181109162530
MDC IDC SET LEADCHNL RA PACING AMPLITUDE: 2 V
MDC IDC SET LEADCHNL RV PACING AMPLITUDE: 2.5 V
MDC IDC SET LEADCHNL RV PACING PULSEWIDTH: 0.4 ms
MDC IDC SET LEADCHNL RV SENSING SENSITIVITY: 2 mV

## 2017-02-22 ENCOUNTER — Encounter: Payer: Self-pay | Admitting: Cardiology

## 2017-02-22 NOTE — Progress Notes (Signed)
Cardiology Office Note  Date: 02/23/2017   ID: [name], DOB 06-24-1925, MRN [id number]  PCP: Sinda Du, MD  Primary Cardiologist: Rozann Lesches, MD   Chief Complaint  Patient presents with  . Cardiac follow-up    History of Present Illness: Laura Martin is an 83 y.o. female last seen in November 2017.  She is here today with her husband for a follow-up visit.  She does not report any syncope, palpitations, or chest pain.  Has more chronic problems with balance, uses a cane, has had no recent injuries.  She is having difficulty with arthritic right knee pain and is following with an orthopedist, had an injection most recently.  It is not entirely clear that she will be having surgery.  She continues to follow in the device clinic with Dr. Lovena Le, Medtronic pacemaker in place.  I reviewed her medications which are outlined below.  She continues on Norvasc for treatment of hypertension.  She continues to follow with Dr. [name].  Past Medical History:  Diagnosis Date  . Complete heart block ([place])    a. s/p PPM 12/31/13:  Medtronic Adapta L (serial number [id number]) pacemaker  . Essential hypertension   . Goiter   . Mixed hyperlipidemia   . Spinal stenosis   . Thyroid nodule     Past Surgical History:  Procedure Laterality Date  . BIOPSY THYROID Left 11/2006  . BREAST BIOPSY Right 1990's   "calcium deposit"  . CATARACT EXTRACTION W/ INTRAOCULAR LENS  IMPLANT, BILATERAL Bilateral 2001  . DILATION AND CURETTAGE OF UTERUS     S/P miscarriage  . ESOPHAGEAL DILATION N/A 03/08/2015   Procedure: ESOPHAGEAL DILATION;  Surgeon: Rogene Houston, MD;  [facility];  Service: Endoscopy;  Laterality: N/A;  . ESOPHAGOGASTRODUODENOSCOPY N/A 03/08/2015   Procedure: ESOPHAGOGASTRODUODENOSCOPY (EGD);  Surgeon: Rogene Houston, MD;  [facility];  Service: Endoscopy;  Laterality: N/A;  3:00  . INSERT / REPLACE / REMOVE PACEMAKER  12/30/2013  . KNEE  ARTHROSCOPY Left 1986  . PERMANENT PACEMAKER INSERTION N/A 12/30/2013   Procedure: PERMANENT PACEMAKER INSERTION;  Surgeon: Evans Lance, MD;  Location: [facility] CATH LAB;  Service: Cardiovascular;  Laterality: N/A;  . TONSILLECTOMY  ~ 1943  . TOTAL KNEE ARTHROPLASTY Left 09/2010    Current Outpatient Medications  Medication Sig Dispense Refill  . acetaminophen (TYLENOL) 500 MG tablet Take 500 mg by mouth every 6 (six) hours as needed.    Marland Kitchen amLODipine (NORVASC) 10 MG tablet Take 10 mg by mouth daily.    . Calcium Carbonate (CALCIUM 600 PO) Take 600 mg by mouth daily. Takes 3 tabs daily    . cholecalciferol (VITAMIN D) 400 UNITS TABS tablet Take 400 Units by mouth.    . Coenzyme Q10 (CO Q 10) 100 MG CAPS Take 100 mg by mouth daily.    . diphenhydrAMINE (BENADRYL) 25 MG tablet Take 25 mg by mouth at bedtime as needed for sleep.    . fish oil-omega-3 fatty acids 1000 MG capsule Take 1 g by mouth daily.    . naproxen sodium (ANAPROX) 220 MG tablet Take 220 mg by mouth at bedtime as needed.    . pantoprazole (PROTONIX) 40 MG tablet Take 40 mg by mouth daily.     No current facility-administered medications for this visit.    Allergies:  Codeine; Tramadol; and Sulfa antibiotics   Social History: The patient  reports that  has never smoked. she has never used  smokeless tobacco. She reports that she does not drink alcohol or use drugs.   ROS:  Please see the history of present illness. Otherwise, complete review of systems is positive for hearing loss.  All other systems are reviewed and negative.   Physical Exam: VS:  BP (!) 148/70 (BP Location: Right Arm)   Pulse 72   Ht 5\' 7"  (1.702 m)   Wt 152 lb (68.9 kg)   SpO2 93%   BMI 23.81 kg/m , BMI Body mass index is 23.81 kg/m.  Wt Readings from Last 3 Encounters:  02/23/17 152 lb (68.9 kg)  01/15/17 153 lb (69.4 kg)  02/13/16 149 lb (67.6 kg)    General: Elderly woman, appears comfortable at rest. HEENT: Conjunctiva and lids normal,  oropharynx clear. Neck: Supple, no elevated JVP or carotid bruits, no thyromegaly. Lungs: Clear to auscultation, nonlabored breathing at rest. Cardiac: Regular rate and rhythm, no S3, 2/6 systolic murmur, no pericardial rub. Abdomen: Soft, nontender, bowel sounds present, no guarding or rebound. Extremities: No pitting edema, distal pulses 2+. Skin: Warm and dry. Musculoskeletal: Mild kyphosis. Neuropsychiatric: Alert and oriented x3, affect grossly appropriate.  ECG: I personally reviewed the tracing from 02/13/2016 which showed a ventricular paced rhythm.  Other Studies Reviewed Today:   Echocardiogram 11/16/2013: Study Conclusions  - Left ventricle: The cavity size was normal. Wall thickness was increased in a pattern of mild LVH. Systolic function was normal. The estimated ejection fraction was in the range of 60% to 65%. Wall motion was normal; there were no regional wall motion abnormalities. Doppler parameters are consistent with abnormal left ventricular relaxation (grade 1 diastolic dysfunction). Doppler parameters are consistent with high ventricular filling pressure. - Mitral valve: Mildly thickened leaflets . There was trivial regurgitation. - Tricuspid valve: There was mild regurgitation. - Pulmonic valve: There was mild regurgitation. - Pulmonary arteries: PA peak pressure: 33 mm Hg (S).  Assessment and Plan:  1.  Essential hypertension, plan to continue Norvasc.  Keep follow-up with Dr. [name].  2.  Complete heart block status post Medtronic pacemaker.  She continues to follow with Dr. Lovena Le.  3.  Hyperlipidemia, diet managed and also on omega-3 supplements.  She continues to follow with Dr. [name].  4.  Chronic arthritic knee pain.  She continues to follow with orthopedics.  Current medicines were reviewed with the patient today.   Orders Placed This Encounter  Procedures  . EKG 12-Lead    Disposition: Follow-up in 1  year.  Signed, Satira Sark, MD, [facility] 02/23/2017 9:53 AM    [facility] [place]. [address] [place], [place] [address] Phone: [phone]; Fax: [phone]

## 2017-02-23 ENCOUNTER — Ambulatory Visit (INDEPENDENT_AMBULATORY_CARE_PROVIDER_SITE_OTHER): Payer: Medicare Other | Admitting: Cardiology

## 2017-02-23 ENCOUNTER — Encounter: Payer: Self-pay | Admitting: Cardiology

## 2017-02-23 VITALS — BP 148/70 | HR 72 | Ht 67.0 in | Wt 152.0 lb

## 2017-02-23 DIAGNOSIS — E782 Mixed hyperlipidemia: Secondary | ICD-10-CM

## 2017-02-23 DIAGNOSIS — I10 Essential (primary) hypertension: Secondary | ICD-10-CM | POA: Diagnosis not present

## 2017-02-23 DIAGNOSIS — Z950 Presence of cardiac pacemaker: Secondary | ICD-10-CM | POA: Diagnosis not present

## 2017-02-23 DIAGNOSIS — I442 Atrioventricular block, complete: Secondary | ICD-10-CM | POA: Diagnosis not present

## 2017-02-23 NOTE — Patient Instructions (Signed)
Your physician wants you to follow-up in: 1 year with Dr.McDowell You will receive a reminder letter in the mail two months in advance. If you don't receive a letter, please call our office to schedule the follow-up appointment.    Your physician recommends that you continue on your current medications as directed. Please refer to the Current Medication list given to you today.    If you need a refill on your cardiac medications before your next appointment, please call your pharmacy.     No lab work or tests ordered today.      Thank you for choosing [facility] !        

## 2017-03-09 DIAGNOSIS — M1711 Unilateral primary osteoarthritis, right knee: Secondary | ICD-10-CM | POA: Diagnosis not present

## 2017-03-18 DIAGNOSIS — M1711 Unilateral primary osteoarthritis, right knee: Secondary | ICD-10-CM | POA: Diagnosis not present

## 2017-03-25 DIAGNOSIS — M1711 Unilateral primary osteoarthritis, right knee: Secondary | ICD-10-CM | POA: Diagnosis not present

## 2017-04-05 ENCOUNTER — Emergency Department (HOSPITAL_COMMUNITY)
Admission: EM | Admit: 2017-04-05 | Discharge: 2017-04-05 | Disposition: A | Discharge status: Home / Self Care | Payer: Medicare Other | Attending: Emergency Medicine | Admitting: Emergency Medicine

## 2017-04-05 ENCOUNTER — Other Ambulatory Visit: Payer: Self-pay

## 2017-04-05 ENCOUNTER — Encounter (HOSPITAL_COMMUNITY): Payer: Self-pay | Admitting: Emergency Medicine

## 2017-04-05 ENCOUNTER — Emergency Department (HOSPITAL_COMMUNITY): Payer: Medicare Other

## 2017-04-05 DIAGNOSIS — I10 Essential (primary) hypertension: Secondary | ICD-10-CM | POA: Insufficient documentation

## 2017-04-05 DIAGNOSIS — Z79899 Other long term (current) drug therapy: Secondary | ICD-10-CM | POA: Diagnosis not present

## 2017-04-05 DIAGNOSIS — S59912A Unspecified injury of left forearm, initial encounter: Secondary | ICD-10-CM | POA: Diagnosis present

## 2017-04-05 DIAGNOSIS — W1830XA Fall on same level, unspecified, initial encounter: Secondary | ICD-10-CM | POA: Diagnosis not present

## 2017-04-05 DIAGNOSIS — S52292A Other fracture of shaft of left ulna, initial encounter for closed fracture: Secondary | ICD-10-CM | POA: Diagnosis not present

## 2017-04-05 DIAGNOSIS — M79602 Pain in left arm: Secondary | ICD-10-CM

## 2017-04-05 DIAGNOSIS — Y9289 Other specified places as the place of occurrence of the external cause: Secondary | ICD-10-CM | POA: Diagnosis not present

## 2017-04-05 DIAGNOSIS — Y999 Unspecified external cause status: Secondary | ICD-10-CM | POA: Diagnosis not present

## 2017-04-05 DIAGNOSIS — S52602A Unspecified fracture of lower end of left ulna, initial encounter for closed fracture: Secondary | ICD-10-CM | POA: Diagnosis not present

## 2017-04-05 DIAGNOSIS — Y9389 Activity, other specified: Secondary | ICD-10-CM | POA: Insufficient documentation

## 2017-04-05 DIAGNOSIS — S52202A Unspecified fracture of shaft of left ulna, initial encounter for closed fracture: Secondary | ICD-10-CM | POA: Diagnosis not present

## 2017-04-05 DIAGNOSIS — M79632 Pain in left forearm: Secondary | ICD-10-CM | POA: Diagnosis not present

## 2017-04-05 MED ORDER — HYDROCODONE-ACETAMINOPHEN 5-325 MG PO TABS: 1.0000 | ORAL_TABLET | Freq: Four times a day (QID) | ORAL | 0 refills | Status: DC | PRN

## 2017-04-05 NOTE — ED Notes (Signed)
ED Provider at bedside. 

## 2017-04-05 NOTE — ED Triage Notes (Signed)
Patient c/o left forearm pain after hitting arm against hearth of fireplace trying to prevent herself from falling out of recliner last night. Patient reports taking benadryl and tylenol with some relief. Last dose of tylenol this morning at 7am.

## 2017-04-05 NOTE — ED Provider Notes (Signed)
[facility] EMERGENCY DEPARTMENT Provider Note   CSN: [id number] Arrival date & time: 04/05/17  8850     History   Chief Complaint Chief Complaint  Patient presents with  . Arm Pain    HPI Laura Martin is a 84 y.o. female.  Patient states that she lost her balance and fell and hurt her left arm.  She did not hit anything else.   The history is provided by the patient. No language interpreter was used.  Arm Pain  This is a new problem. The current episode started 12 to 24 hours ago. The problem occurs rarely. The problem has been resolved. Pertinent negatives include no chest pain, no abdominal pain and no headaches. Exacerbated by: Movement.    Past Medical History:  Diagnosis Date  . Complete heart block ([place])    a. s/p PPM 12/31/13:  Medtronic Adapta L (serial number [id number]) pacemaker  . Essential hypertension   . Goiter   . Mixed hyperlipidemia   . Spinal stenosis   . Thyroid nodule     Patient Active Problem List   Diagnosis Date Noted  . Pacemaker 05/12/2014  . Complete heart block ([place]) 01/01/2014  . Mobitz type 2 second degree AV block 12/30/2013  . Heart murmur 11/11/2013  . Essential hypertension, benign 05/27/2011  . Mixed hyperlipidemia 05/27/2011    Past Surgical History:  Procedure Laterality Date  . BIOPSY THYROID Left 11/2006  . BREAST BIOPSY Right 1990's   "calcium deposit"  . CATARACT EXTRACTION W/ INTRAOCULAR LENS  IMPLANT, BILATERAL Bilateral 2001  . DILATION AND CURETTAGE OF UTERUS     S/P miscarriage  . ESOPHAGEAL DILATION N/A 03/08/2015   Procedure: ESOPHAGEAL DILATION;  Surgeon: Rogene Houston, MD;  [facility];  Service: Endoscopy;  Laterality: N/A;  . ESOPHAGOGASTRODUODENOSCOPY N/A 03/08/2015   Procedure: ESOPHAGOGASTRODUODENOSCOPY (EGD);  Surgeon: Rogene Houston, MD;  Location: [facility] ENDO SUITE;  Service: Endoscopy;  Laterality: N/A;  3:00  . INSERT / REPLACE / REMOVE PACEMAKER  12/30/2013  . KNEE ARTHROSCOPY Left  1986  . PERMANENT PACEMAKER INSERTION N/A 12/30/2013   Procedure: PERMANENT PACEMAKER INSERTION;  Surgeon: Evans Lance, MD;  Location: [facility] CATH LAB;  Service: Cardiovascular;  Laterality: N/A;  . TONSILLECTOMY  ~ 1943  . TOTAL KNEE ARTHROPLASTY Left 09/2010    OB History    No data available       Home Medications    Prior to Admission medications   Medication Sig Start Date End Date Taking? Authorizing Provider  acetaminophen (TYLENOL) 500 MG tablet Take 500 mg by mouth every 6 (six) hours as needed.    [provider]  amLODipine (NORVASC) 10 MG tablet Take 10 mg by mouth daily.    [provider]  Calcium Carbonate (CALCIUM 600 PO) Take 600 mg by mouth daily. Takes 3 tabs daily    [provider]  cholecalciferol (VITAMIN D) 400 UNITS TABS tablet Take 400 Units by mouth.    [provider]  Coenzyme Q10 (CO Q 10) 100 MG CAPS Take 100 mg by mouth daily.    [provider]  diphenhydrAMINE (BENADRYL) 25 MG tablet Take 25 mg by mouth at bedtime as needed for sleep.    [provider]  fish oil-omega-3 fatty acids 1000 MG capsule Take 1 g by mouth daily.    [provider]  HYDROcodone-acetaminophen (NORCO/VICODIN) 5-325 MG tablet Take 1 tablet by mouth every 6 (six) hours as needed for moderate pain.  04/05/17   Milton Ferguson, MD  naproxen sodium (ANAPROX) 220 MG tablet Take 220 mg by mouth at bedtime as needed.    [provider]  pantoprazole (PROTONIX) 40 MG tablet Take 40 mg by mouth daily.    [provider]    Family History Family History  Problem Relation Age of Onset  . Cancer Unknown   . Stroke Unknown     Social History Social History   Tobacco Use  . Smoking status: Never Smoker  . Smokeless tobacco: Never Used  Substance Use Topics  . Alcohol use: No    Alcohol/week: 0.0 oz  . Drug use: No     Allergies   Codeine; Tramadol; and Sulfa antibiotics   Review of Systems Review  of Systems  Constitutional: Negative for appetite change and fatigue.  HENT: Negative for congestion, ear discharge and sinus pressure.   Eyes: Negative for discharge.  Respiratory: Negative for cough.   Cardiovascular: Negative for chest pain.  Gastrointestinal: Negative for abdominal pain and diarrhea.  Genitourinary: Negative for frequency and hematuria.  Musculoskeletal: Negative for back pain.       Pain left forearm  Skin: Negative for rash.  Neurological: Negative for seizures and headaches.  Psychiatric/Behavioral: Negative for hallucinations.     Physical Exam Updated Vital Signs BP (!) 151/81 (BP Location: Right Arm)   Pulse 82   Temp 97.6 F (36.4 C) (Oral)   Resp 16   Ht 5\' 7"  (1.702 m)   Wt 69.9 kg (154 lb)   SpO2 99%   BMI 24.12 kg/m   Physical Exam  Constitutional: She is oriented to person, place, and time. She appears well-developed.  HENT:  Head: Normocephalic.  Eyes: Conjunctivae are normal.  Neck: No tracheal deviation present.  Cardiovascular:  No murmur heard. Musculoskeletal: Normal range of motion.  Mild tenderness to the midshaft left forearm neurovascular exam normal  Neurological: She is oriented to person, place, and time.  Skin: Skin is warm.  Psychiatric: She has a normal mood and affect.     ED Treatments / Results  Labs (all labs ordered are listed, but only abnormal results are displayed) Labs Reviewed - No data to display  EKG  EKG Interpretation None       Radiology Dg Forearm Left  Result Date: 04/05/2017 CLINICAL DATA:  Fall, pain EXAM: LEFT FOREARM - 2 VIEW COMPARISON:  None. FINDINGS: Fracture through the distal ulnar shaft, minimally displaced. No visible radial abnormality. No subluxation or dislocation. Arthritic changes in the left wrist. IMPRESSION: Minimally displaced left distal ulnar shaft fracture. Electronically Signed   By: Rolm Baptise M.D.   On: 04/05/2017 09:27    Procedures Procedures (including  critical care time)  Medications Ordered in ED Medications - No data to display   Initial Impression / Assessment and Plan / ED Course  I have reviewed the triage vital signs and the nursing notes.  Pertinent labs & imaging results that were available during my care of the patient were reviewed by me and considered in my medical decision making (see chart for details).     X-ray shows fracture of distal ulna.  Patient has a closed fracture of the distal ulna and she will be put in a splint and will follow up with orthopedic doctor next week  Final Clinical Impressions(s) / ED Diagnoses   Final diagnoses:  Left arm pain    ED Discharge Orders        Ordered  HYDROcodone-acetaminophen (NORCO/VICODIN) 5-325 MG tablet  Every 6 hours PRN     04/05/17 0949       Milton Ferguson, MD 04/05/17 [phone]

## 2017-04-05 NOTE — Discharge Instructions (Signed)
Take Tylenol or Motrin for pain.  He can take the Vicodin if needed.  Follow-up with your orthopedic doctor next week.  Speak with Dr. [name] about getting a tetanus shot

## 2017-04-08 DIAGNOSIS — S52002A Unspecified fracture of upper end of left ulna, initial encounter for closed fracture: Secondary | ICD-10-CM | POA: Diagnosis not present

## 2017-04-15 DIAGNOSIS — S52002D Unspecified fracture of upper end of left ulna, subsequent encounter for closed fracture with routine healing: Secondary | ICD-10-CM | POA: Diagnosis not present

## 2017-04-16 ENCOUNTER — Telehealth: Payer: Self-pay | Admitting: Cardiology

## 2017-04-16 ENCOUNTER — Ambulatory Visit (INDEPENDENT_AMBULATORY_CARE_PROVIDER_SITE_OTHER): Payer: Medicare Other | Admitting: *Deleted

## 2017-04-16 DIAGNOSIS — I442 Atrioventricular block, complete: Secondary | ICD-10-CM

## 2017-04-16 NOTE — Telephone Encounter (Signed)
LMOVM reminding pt to send remote transmission.   

## 2017-04-16 NOTE — Progress Notes (Signed)
Remote pacemaker transmission.   

## 2017-04-17 ENCOUNTER — Encounter: Payer: Self-pay | Admitting: Cardiology

## 2017-04-22 DIAGNOSIS — S52002D Unspecified fracture of upper end of left ulna, subsequent encounter for closed fracture with routine healing: Secondary | ICD-10-CM | POA: Diagnosis not present

## 2017-04-29 DIAGNOSIS — H02401 Unspecified ptosis of right eyelid: Secondary | ICD-10-CM | POA: Diagnosis not present

## 2017-04-29 DIAGNOSIS — Z961 Presence of intraocular lens: Secondary | ICD-10-CM | POA: Diagnosis not present

## 2017-04-29 DIAGNOSIS — H02001 Unspecified entropion of right upper eyelid: Secondary | ICD-10-CM | POA: Diagnosis not present

## 2017-04-29 DIAGNOSIS — H02002 Unspecified entropion of right lower eyelid: Secondary | ICD-10-CM | POA: Diagnosis not present

## 2017-05-06 LAB — CUP PACEART REMOTE DEVICE CHECK
Battery Impedance: 182 Ohm
Battery Remaining Longevity: 117 mo
Battery Voltage: 2.8 V
Brady Statistic AS VS Percent: 1 %
Date Time Interrogation Session: 20190124193320
Implantable Lead Implant Date: 20151009
Implantable Lead Location: 753859
Implantable Lead Location: 753860
Implantable Lead Model: 5076
Implantable Pulse Generator Implant Date: 20151009
Lead Channel Pacing Threshold Amplitude: 0.375 V
Lead Channel Pacing Threshold Pulse Width: 0.4 ms
Lead Channel Pacing Threshold Pulse Width: 0.4 ms
Lead Channel Setting Pacing Amplitude: 2 V
Lead Channel Setting Pacing Amplitude: 2.5 V
Lead Channel Setting Pacing Pulse Width: 0.4 ms
MDC IDC LEAD IMPLANT DT: 20151009
MDC IDC MSMT LEADCHNL RA IMPEDANCE VALUE: 470 Ohm
MDC IDC MSMT LEADCHNL RV IMPEDANCE VALUE: 552 Ohm
MDC IDC MSMT LEADCHNL RV PACING THRESHOLD AMPLITUDE: 0.625 V
MDC IDC SET LEADCHNL RV SENSING SENSITIVITY: 2 mV
MDC IDC STAT BRADY AP VP PERCENT: 26 %
MDC IDC STAT BRADY AP VS PERCENT: 0 %
MDC IDC STAT BRADY AS VP PERCENT: 73 %

## 2017-05-14 DIAGNOSIS — H903 Sensorineural hearing loss, bilateral: Secondary | ICD-10-CM | POA: Diagnosis not present

## 2017-05-18 DIAGNOSIS — S52002D Unspecified fracture of upper end of left ulna, subsequent encounter for closed fracture with routine healing: Secondary | ICD-10-CM | POA: Diagnosis not present

## 2017-05-18 DIAGNOSIS — M25561 Pain in right knee: Secondary | ICD-10-CM | POA: Diagnosis not present

## 2017-06-17 DIAGNOSIS — S52002D Unspecified fracture of upper end of left ulna, subsequent encounter for closed fracture with routine healing: Secondary | ICD-10-CM | POA: Diagnosis not present

## 2017-07-01 DIAGNOSIS — H02001 Unspecified entropion of right upper eyelid: Secondary | ICD-10-CM | POA: Diagnosis not present

## 2017-07-01 DIAGNOSIS — Z961 Presence of intraocular lens: Secondary | ICD-10-CM | POA: Diagnosis not present

## 2017-07-01 DIAGNOSIS — H02401 Unspecified ptosis of right eyelid: Secondary | ICD-10-CM | POA: Diagnosis not present

## 2017-07-01 DIAGNOSIS — H02002 Unspecified entropion of right lower eyelid: Secondary | ICD-10-CM | POA: Diagnosis not present

## 2017-07-16 ENCOUNTER — Ambulatory Visit (INDEPENDENT_AMBULATORY_CARE_PROVIDER_SITE_OTHER): Payer: Medicare Other | Admitting: *Deleted

## 2017-07-16 DIAGNOSIS — I442 Atrioventricular block, complete: Secondary | ICD-10-CM

## 2017-07-17 ENCOUNTER — Encounter: Payer: Self-pay | Admitting: Cardiology

## 2017-07-17 NOTE — Progress Notes (Signed)
Remote pacemaker transmission.   

## 2017-07-20 ENCOUNTER — Other Ambulatory Visit (HOSPITAL_COMMUNITY): Payer: Self-pay | Admitting: Pulmonary Disease

## 2017-07-20 DIAGNOSIS — I10 Essential (primary) hypertension: Secondary | ICD-10-CM | POA: Diagnosis not present

## 2017-07-20 DIAGNOSIS — Z780 Asymptomatic menopausal state: Secondary | ICD-10-CM

## 2017-07-20 DIAGNOSIS — H02002 Unspecified entropion of right lower eyelid: Secondary | ICD-10-CM | POA: Diagnosis not present

## 2017-07-20 DIAGNOSIS — M48061 Spinal stenosis, lumbar region without neurogenic claudication: Secondary | ICD-10-CM | POA: Diagnosis not present

## 2017-07-20 DIAGNOSIS — R739 Hyperglycemia, unspecified: Secondary | ICD-10-CM | POA: Diagnosis not present

## 2017-07-20 DIAGNOSIS — Z950 Presence of cardiac pacemaker: Secondary | ICD-10-CM | POA: Diagnosis not present

## 2017-07-20 LAB — COMPREHENSIVE METABOLIC PANEL
Albumin: 4.7 (ref 3.5–5.0)
Calcium: 10.2 (ref 8.7–10.7)
GFR calc Af Amer: 89
GFR calc non Af Amer: 77
Globulin: 2.3

## 2017-07-20 LAB — CBC AND DIFFERENTIAL
HCT: 38 (ref 36–46)
Hemoglobin: 13.4 (ref 12.0–16.0)
Platelets: 276 (ref 150–399)
WBC: 7.7

## 2017-07-20 LAB — BASIC METABOLIC PANEL
BUN: 19 (ref 4–21)
CO2: 30 — AB (ref 13–22)
Chloride: 101 (ref 99–108)
Creatinine: 0.7 (ref <=1.1)
Glucose: 117
Potassium: 4 (ref 3.4–5.3)
Sodium: 139 (ref 137–147)

## 2017-07-20 LAB — HEMOGLOBIN A1C: Hemoglobin A1C: 4.9

## 2017-07-20 LAB — CBC: RBC: 4.36 (ref 3.87–5.11)

## 2017-07-23 LAB — LIPID PANEL
Cholesterol: 216 — AB (ref 0–200)
HDL: 68 (ref 35–70)
LDL Cholesterol: 123
Triglycerides: 131 (ref 40–160)

## 2017-07-29 ENCOUNTER — Ambulatory Visit (HOSPITAL_COMMUNITY)
Admission: RE | Admit: 2017-07-29 | Discharge: 2017-07-29 | Disposition: A | Discharge status: Home / Self Care | Payer: Medicare Other | Source: Ambulatory Visit | Attending: Pulmonary Disease | Admitting: Pulmonary Disease

## 2017-07-29 DIAGNOSIS — M810 Age-related osteoporosis without current pathological fracture: Secondary | ICD-10-CM | POA: Insufficient documentation

## 2017-07-29 DIAGNOSIS — Z780 Asymptomatic menopausal state: Secondary | ICD-10-CM | POA: Diagnosis not present

## 2017-07-29 DIAGNOSIS — M85851 Other specified disorders of bone density and structure, right thigh: Secondary | ICD-10-CM | POA: Diagnosis not present

## 2017-07-29 LAB — HM DEXA SCAN

## 2017-08-11 LAB — CUP PACEART REMOTE DEVICE CHECK
Battery Remaining Longevity: 112 mo
Battery Voltage: 2.8 V
Brady Statistic AP VS Percent: 0 %
Brady Statistic AS VS Percent: 1 %
Implantable Lead Implant Date: 20151009
Implantable Lead Location: 753860
Implantable Lead Model: 5076
Implantable Lead Model: 5076
Lead Channel Pacing Threshold Amplitude: 0.375 V
Lead Channel Pacing Threshold Pulse Width: 0.4 ms
Lead Channel Setting Pacing Amplitude: 2 V
Lead Channel Setting Pacing Pulse Width: 0.4 ms
Lead Channel Setting Sensing Sensitivity: 2 mV
MDC IDC LEAD IMPLANT DT: 20151009
MDC IDC LEAD LOCATION: 753859
MDC IDC MSMT BATTERY IMPEDANCE: 206 Ohm
MDC IDC MSMT LEADCHNL RA IMPEDANCE VALUE: 463 Ohm
MDC IDC MSMT LEADCHNL RA PACING THRESHOLD PULSEWIDTH: 0.4 ms
MDC IDC MSMT LEADCHNL RV IMPEDANCE VALUE: 531 Ohm
MDC IDC MSMT LEADCHNL RV PACING THRESHOLD AMPLITUDE: 0.75 V
MDC IDC PG IMPLANT DT: 20151009
MDC IDC SESS DTM: 20190425172150
MDC IDC SET LEADCHNL RV PACING AMPLITUDE: 2.5 V
MDC IDC STAT BRADY AP VP PERCENT: 27 %
MDC IDC STAT BRADY AS VP PERCENT: 72 %

## 2017-08-25 DIAGNOSIS — H02032 Senile entropion of right lower eyelid: Secondary | ICD-10-CM | POA: Diagnosis not present

## 2017-08-25 DIAGNOSIS — H02042 Spastic entropion of right lower eyelid: Secondary | ICD-10-CM | POA: Diagnosis not present

## 2017-08-25 DIAGNOSIS — H02052 Trichiasis without entropian right lower eyelid: Secondary | ICD-10-CM | POA: Diagnosis not present

## 2017-08-25 DIAGNOSIS — H02532 Eyelid retraction right lower eyelid: Secondary | ICD-10-CM | POA: Diagnosis not present

## 2017-08-25 DIAGNOSIS — H02423 Myogenic ptosis of bilateral eyelids: Secondary | ICD-10-CM | POA: Diagnosis not present

## 2017-09-23 DIAGNOSIS — R002 Palpitations: Secondary | ICD-10-CM | POA: Diagnosis not present

## 2017-09-23 DIAGNOSIS — H02002 Unspecified entropion of right lower eyelid: Secondary | ICD-10-CM | POA: Diagnosis not present

## 2017-09-23 DIAGNOSIS — I10 Essential (primary) hypertension: Secondary | ICD-10-CM | POA: Diagnosis not present

## 2017-09-30 DIAGNOSIS — M1711 Unilateral primary osteoarthritis, right knee: Secondary | ICD-10-CM | POA: Diagnosis not present

## 2017-10-15 ENCOUNTER — Ambulatory Visit (INDEPENDENT_AMBULATORY_CARE_PROVIDER_SITE_OTHER): Payer: Medicare Other | Admitting: *Deleted

## 2017-10-15 DIAGNOSIS — I441 Atrioventricular block, second degree: Secondary | ICD-10-CM | POA: Diagnosis not present

## 2017-10-15 NOTE — Progress Notes (Signed)
Remote pacemaker transmission.   

## 2017-10-23 ENCOUNTER — Other Ambulatory Visit: Payer: Self-pay

## 2017-10-28 DIAGNOSIS — M1711 Unilateral primary osteoarthritis, right knee: Secondary | ICD-10-CM | POA: Diagnosis not present

## 2017-11-04 DIAGNOSIS — H02032 Senile entropion of right lower eyelid: Secondary | ICD-10-CM | POA: Diagnosis not present

## 2017-11-04 DIAGNOSIS — H02423 Myogenic ptosis of bilateral eyelids: Secondary | ICD-10-CM | POA: Diagnosis not present

## 2017-11-04 DIAGNOSIS — H02052 Trichiasis without entropian right lower eyelid: Secondary | ICD-10-CM | POA: Diagnosis not present

## 2017-11-04 DIAGNOSIS — H02042 Spastic entropion of right lower eyelid: Secondary | ICD-10-CM | POA: Diagnosis not present

## 2017-11-04 DIAGNOSIS — H02532 Eyelid retraction right lower eyelid: Secondary | ICD-10-CM | POA: Diagnosis not present

## 2017-11-04 HISTORY — PX: OTHER SURGICAL HISTORY: SHX169

## 2017-11-23 LAB — CUP PACEART REMOTE DEVICE CHECK
Battery Impedance: 206 Ohm
Battery Voltage: 2.8 V
Brady Statistic AP VP Percent: 28 %
Brady Statistic AP VS Percent: 0 %
Implantable Lead Implant Date: 20151009
Implantable Lead Location: 753859
Implantable Lead Model: 5076
Implantable Lead Model: 5076
Implantable Pulse Generator Implant Date: 20151009
Lead Channel Impedance Value: 470 Ohm
Lead Channel Impedance Value: 578 Ohm
Lead Channel Pacing Threshold Amplitude: 0.375 V
Lead Channel Setting Pacing Amplitude: 2 V
Lead Channel Setting Pacing Amplitude: 2.5 V
Lead Channel Setting Sensing Sensitivity: 2 mV
MDC IDC LEAD IMPLANT DT: 20151009
MDC IDC LEAD LOCATION: 753860
MDC IDC MSMT BATTERY REMAINING LONGEVITY: 114 mo
MDC IDC MSMT LEADCHNL RA PACING THRESHOLD PULSEWIDTH: 0.4 ms
MDC IDC MSMT LEADCHNL RV PACING THRESHOLD AMPLITUDE: 0.625 V
MDC IDC MSMT LEADCHNL RV PACING THRESHOLD PULSEWIDTH: 0.4 ms
MDC IDC SESS DTM: 20190725132250
MDC IDC SET LEADCHNL RV PACING PULSEWIDTH: 0.4 ms
MDC IDC STAT BRADY AS VP PERCENT: 71 %
MDC IDC STAT BRADY AS VS PERCENT: 1 %

## 2017-12-07 ENCOUNTER — Telehealth: Payer: Self-pay | Admitting: Cardiology

## 2017-12-07 NOTE — Telephone Encounter (Signed)
Patient called and stated that her device is moving around, and flipping and she has to move it back to the original spot. It is worse when she is sleep and when she raises her left arm. Patient that she has lost a little weight but not much and it is not recent weight loss. I instructed pt to send a remote transmission.

## 2017-12-07 NOTE — Telephone Encounter (Signed)
Advised Laura Martin that sometimes the pacemaker can move around in the pocket and flip on its side. I advised her to just move it back down if that's the case. She just wanted to make sure because she has not noticed it before. Confirmed upcoming appointments and transmissions.

## 2017-12-07 NOTE — Telephone Encounter (Signed)
Attempted to help pt trouble shoot her home monitor. After one unsuccessful attempt I instructed pt call tech support.

## 2017-12-31 DIAGNOSIS — M1711 Unilateral primary osteoarthritis, right knee: Secondary | ICD-10-CM | POA: Diagnosis not present

## 2018-01-14 ENCOUNTER — Ambulatory Visit (INDEPENDENT_AMBULATORY_CARE_PROVIDER_SITE_OTHER): Payer: Medicare Other | Admitting: *Deleted

## 2018-01-14 DIAGNOSIS — I441 Atrioventricular block, second degree: Secondary | ICD-10-CM | POA: Diagnosis not present

## 2018-01-14 NOTE — Progress Notes (Signed)
Remote pacemaker transmission.   

## 2018-02-03 ENCOUNTER — Encounter: Payer: Medicare Other | Admitting: Internal Medicine

## 2018-02-05 LAB — CUP PACEART REMOTE DEVICE CHECK
Battery Impedance: 230 Ohm
Battery Remaining Longevity: 110 mo
Brady Statistic AP VP Percent: 28 %
Brady Statistic AS VP Percent: 71 %
Date Time Interrogation Session: 20191024130821
Implantable Lead Implant Date: 20151009
Implantable Lead Location: 753859
Implantable Lead Location: 753860
Implantable Lead Model: 5076
Implantable Pulse Generator Implant Date: 20151009
Lead Channel Impedance Value: 490 Ohm
Lead Channel Pacing Threshold Pulse Width: 0.4 ms
Lead Channel Setting Pacing Amplitude: 2 V
Lead Channel Setting Sensing Sensitivity: 2 mV
MDC IDC LEAD IMPLANT DT: 20151009
MDC IDC MSMT BATTERY VOLTAGE: 2.8 V
MDC IDC MSMT LEADCHNL RA PACING THRESHOLD AMPLITUDE: 0.375 V
MDC IDC MSMT LEADCHNL RA SENSING INTR AMPL: 0.7 mV
MDC IDC MSMT LEADCHNL RV IMPEDANCE VALUE: 576 Ohm
MDC IDC MSMT LEADCHNL RV PACING THRESHOLD AMPLITUDE: 0.75 V
MDC IDC MSMT LEADCHNL RV PACING THRESHOLD PULSEWIDTH: 0.4 ms
MDC IDC SET LEADCHNL RV PACING AMPLITUDE: 2.5 V
MDC IDC SET LEADCHNL RV PACING PULSEWIDTH: 0.4 ms
MDC IDC STAT BRADY AP VS PERCENT: 0 %
MDC IDC STAT BRADY AS VS PERCENT: 1 %

## 2018-02-09 ENCOUNTER — Encounter: Payer: Self-pay | Admitting: Internal Medicine

## 2018-02-09 ENCOUNTER — Ambulatory Visit (INDEPENDENT_AMBULATORY_CARE_PROVIDER_SITE_OTHER): Payer: Medicare Other | Admitting: Internal Medicine

## 2018-02-09 VITALS — BP 166/70 | HR 70 | Ht 67.0 in | Wt 138.2 lb

## 2018-02-09 DIAGNOSIS — I442 Atrioventricular block, complete: Secondary | ICD-10-CM | POA: Diagnosis not present

## 2018-02-09 DIAGNOSIS — I10 Essential (primary) hypertension: Secondary | ICD-10-CM

## 2018-02-09 DIAGNOSIS — I441 Atrioventricular block, second degree: Secondary | ICD-10-CM | POA: Diagnosis not present

## 2018-02-09 DIAGNOSIS — Z950 Presence of cardiac pacemaker: Secondary | ICD-10-CM

## 2018-02-09 NOTE — Patient Instructions (Signed)
Medication Instructions:  Your physician recommends that you continue on your current medications as directed. Please refer to the Current Medication list given to you today.  Labwork: None ordered.  Testing/Procedures: None ordered.  Follow-Up: Your physician wants you to follow-up in: one year with Dr. Lovena Le.   You will receive a reminder letter in the mail two months in advance. If you don't receive a letter, please call our office to schedule the follow-up appointment.  Remote monitoring is used to monitor your Pacemaker from home. This monitoring reduces the number of office visits required to check your device to one time per year. It allows [place] to keep an eye on the functioning of your device to ensure it is working properly. You are scheduled for a device check from home on 04/15/2018. You may send your transmission at any time that day. If you have a wireless device, the transmission will be sent automatically. After your physician reviews your transmission, you will receive a postcard with your next transmission date.  Any Other Special Instructions Will Be Listed Below (If Applicable).  If you need a refill on your cardiac medications before your next appointment, please call your pharmacy.

## 2018-02-09 NOTE — Progress Notes (Signed)
HPI Laura Martin returns today for ongoing evaluation and management of her symptomatic 2:1 heart block, status post permanent pacemaker insertion. In the interim she has done well except for problems with worsening pain in her right knee. She is able walk without difficulty except for knee pain. She denies chest pain or shortness of breath. No peripheral edema. She was told she was too old for knee replacement.  Allergies  Allergen Reactions  . Codeine Nausea And Vomiting  . Tramadol Nausea And Vomiting  . Sulfa Antibiotics Rash     Current Outpatient Medications  Medication Sig Dispense Refill  . acetaminophen (TYLENOL) 500 MG tablet Take 500 mg by mouth every 6 (six) hours as needed.    Marland Kitchen amLODipine (NORVASC) 10 MG tablet Take 10 mg by mouth daily.    . Calcium Carbonate (CALCIUM 600 PO) Take 600 mg by mouth daily. Takes 3 tabs daily    . cholecalciferol (VITAMIN D) 400 UNITS TABS tablet Take 400 Units by mouth.    . Coenzyme Q10 (CO Q 10) 100 MG CAPS Take 100 mg by mouth daily.    . diphenhydrAMINE (BENADRYL) 25 MG tablet Take 25 mg by mouth at bedtime as needed for sleep.    . fish oil-omega-3 fatty acids 1000 MG capsule Take 1 g by mouth daily.    Marland Kitchen HYDROcodone-acetaminophen (NORCO/VICODIN) 5-325 MG tablet Take 1 tablet by mouth every 6 (six) hours as needed for moderate pain. 20 tablet 0  . naproxen sodium (ANAPROX) 220 MG tablet Take 220 mg by mouth at bedtime as needed.    . pantoprazole (PROTONIX) 40 MG tablet Take 40 mg by mouth daily.     No current facility-administered medications for this visit.      Past Medical History:  Diagnosis Date  . Complete heart block ([place])    a. s/p PPM 12/31/13:  Medtronic Adapta L (serial number [id number]) pacemaker  . Essential hypertension   . Goiter   . Mixed hyperlipidemia   . Spinal stenosis   . Thyroid nodule     ROS:   All systems reviewed and negative except as noted in the HPI.   Past Surgical History:    Procedure Laterality Date  . BIOPSY THYROID Left 11/2006  . BREAST BIOPSY Right 1990's   "calcium deposit"  . CATARACT EXTRACTION W/ INTRAOCULAR LENS  IMPLANT, BILATERAL Bilateral 2001  . DILATION AND CURETTAGE OF UTERUS     S/P miscarriage  . ESOPHAGEAL DILATION N/A 03/08/2015   Procedure: ESOPHAGEAL DILATION;  Surgeon: Rogene Houston, MD;  [facility];  Service: Endoscopy;  Laterality: N/A;  . ESOPHAGOGASTRODUODENOSCOPY N/A 03/08/2015   Procedure: ESOPHAGOGASTRODUODENOSCOPY (EGD);  Surgeon: Rogene [place], MD;  Location[facility];  Service: Endoscopy;  Laterality: N/A;  3:00  . INSERT / REPLACE / REMOVE PACEMAKER  12/30/2013  . KNEE ARTHROSCOPY Left 1986  . PERMANENT PACEMAKER INSERTION N/A 12/30/2013   Procedure: PERMANENT PACEMAKER INSERTION;  Surgeon: Evans Lance, MD;  Location: [facility] CATH LAB;  Service: Cardiovascular;  Laterality: N/A;  . TONSILLECTOMY  ~ 1943  . TOTAL KNEE ARTHROPLASTY Left 09/2010     Family History  Problem Relation Age of Onset  . Cancer Unknown   . Stroke Unknown      Social History   Socioeconomic History  . Marital status: Married    Spouse name: Not on file  . Number of children: Not on file  . Years of education: Not on  file  . Highest education level: Not on file  Occupational History  . Not on file  Social Needs  . Financial resource strain: Not on file  . Food insecurity:    Worry: Not on file    Inability: Not on file  . Transportation needs:    Medical: Not on file    Non-medical: Not on file  Tobacco Use  . Smoking status: Never Smoker  . Smokeless tobacco: Never Used  Substance and Sexual Activity  . Alcohol use: No    Alcohol/week: 0.0 standard drinks  . Drug use: No  . Sexual activity: Not on file  Lifestyle  . Physical activity:    Days per week: Not on file    Minutes per session: Not on file  . Stress: Not on file  Relationships  . Social connections:    Talks on phone: Not on file    Gets  together: Not on file    Attends religious service: Not on file    Active member of club or organization: Not on file    Attends meetings of clubs or organizations: Not on file    Relationship status: Not on file  . Intimate partner violence:    Fear of current or ex partner: Not on file    Emotionally abused: Not on file    Physically abused: Not on file    Forced sexual activity: Not on file  Other Topics Concern  . Not on file  Social History Narrative  . Not on file     BP (!) 166/70   Pulse 70   Ht 5\' 7"  (1.702 m)   Wt 138 lb 3.2 oz (62.7 kg)   SpO2 98%   BMI 21.65 kg/m   Physical Exam:  Well appearing elderly woman, NAD HEENT: Unremarkable Neck:  6 cm JVD, no thyromegally Lymphatics:  No adenopathy Back:  No CVA tenderness Lungs:  Clear with no wheezes HEART:  Regular rate rhythm, no murmurs, no rubs, no clicks Abd:  soft, positive bowel sounds, no organomegally, no rebound, no guarding Ext:  2 plus pulses, no edema, no cyanosis, no clubbing Skin:  No rashes no nodules Neuro:  CN II through XII intact, motor grossly intact  EKG - P synchronous ventricular pacing  DEVICE  Normal device function.  See PaceArt for details.   Assess/Plan: 1. CHB - she has 2:1 conduction today. She is asymptomatic,s/p PPM. 2. PPM - her Medtronic DDD PM is working normally. 3. HTN  - her systolic pressure is up today. She will continue her current meds. I asked her to reduce her sodium intake.   Mikle Bosworth.D.

## 2018-02-22 NOTE — Progress Notes (Signed)
Cardiology Office Note  Date: 02/23/2018   ID: Laura Martin, DOB [birth year], MRN [id number]  PCP: Sinda Du, MD  Primary Cardiologist: Rozann Lesches, MD   Chief Complaint  Patient presents with  . Cardiac follow-up    History of Present Illness: Laura Martin is a 84 y.o. female last seen in December 2018.  She is here with her husband for a routine visit.  She does not report any palpitations or chest pain.  Still functional with ADLs around the house.  Has had no dizziness or sudden syncope.  She sees Dr. Lovena Le in the device clinic, Medtronic pacemaker in place.  Recent visit in November noted.  I reviewed her ECG.  She continues on Norvasc for treatment of hypertension, follow-up is with Dr. [name].  Past Medical History:  Diagnosis Date  . Complete heart block ([place])    a. s/p PPM 12/31/13:  Medtronic Adapta L (serial number [id number]) pacemaker  . Essential hypertension   . Goiter   . Mixed hyperlipidemia   . Spinal stenosis   . Thyroid nodule     Past Surgical History:  Procedure Laterality Date  . BIOPSY THYROID Left 11/2006  . BREAST BIOPSY Right 1990's   "calcium deposit"  . CATARACT EXTRACTION W/ INTRAOCULAR LENS  IMPLANT, BILATERAL Bilateral 2001  . DILATION AND CURETTAGE OF UTERUS     S/P miscarriage  . entropion  11/04/2017   right eye  . ESOPHAGEAL DILATION N/A 03/08/2015   Procedure: ESOPHAGEAL DILATION;  Surgeon: Rogene [place], MD;  [facility];  Service: Endoscopy;  Laterality: N/A;  . ESOPHAGOGASTRODUODENOSCOPY N/A 03/08/2015   Procedure: ESOPHAGOGASTRODUODENOSCOPY (EGD);  Surgeon: Rogene Houston, MD;  [facility];  Service: Endoscopy;  Laterality: N/A;  3:00  . INSERT / REPLACE / REMOVE PACEMAKER  12/30/2013  . KNEE ARTHROSCOPY Left 1986  . PERMANENT PACEMAKER INSERTION N/A 12/30/2013   Procedure: PERMANENT PACEMAKER INSERTION;  Surgeon: Evans Lance, MD;  Location: [facility] CATH LAB;  Service: Cardiovascular;   Laterality: N/A;  . TONSILLECTOMY  ~ 1943  . TOTAL KNEE ARTHROPLASTY Left 09/2010    Current Outpatient Medications  Medication Sig Dispense Refill  . acetaminophen (TYLENOL) 500 MG tablet Take 500 mg by mouth every 6 (six) hours as needed.    Marland Kitchen amLODipine (NORVASC) 10 MG tablet Take 10 mg by mouth daily.    . Calcium Carbonate (CALCIUM 600 PO) Take 600 mg by mouth daily. Takes 3 tabs daily    . cholecalciferol (VITAMIN D) 400 UNITS TABS tablet Take 400 Units by mouth.    . Coenzyme Q10 (CO Q 10) 100 MG CAPS Take 100 mg by mouth daily.    . diphenhydrAMINE (BENADRYL) 25 MG tablet Take 25 mg by mouth at bedtime as needed for sleep.    . fish oil-omega-3 fatty acids 1000 MG capsule Take 1 g by mouth daily.    . naproxen sodium (ANAPROX) 220 MG tablet Take 220 mg by mouth at bedtime as needed.     No current facility-administered medications for this visit.    Allergies:  Codeine; Tramadol; and Sulfa antibiotics   Social History: The patient  reports that she has never smoked. She has never used smokeless tobacco. She reports that she does not drink alcohol or use drugs.   ROS:  Please see the history of present illness. Otherwise, complete review of systems is positive for hearing loss.  All other systems are reviewed and negative.  Physical Exam: VS:  BP (!) 150/60   Pulse 84   Ht 5\' 7"  (1.702 m)   Wt 141 lb (64 kg)   SpO2 96%   BMI 22.08 kg/m , BMI Body mass index is 22.08 kg/m.  Wt Readings from Last 3 Encounters:  02/23/18 141 lb (64 kg)  02/09/18 138 lb 3.2 oz (62.7 kg)  04/05/17 154 lb (69.9 kg)    General: Elderly woman, appears comfortable at rest. HEENT: Conjunctiva and lids normal, oropharynx clear. Neck: Supple, no elevated JVP or carotid bruits, no thyromegaly. Lungs: Clear to auscultation, nonlabored breathing at rest. Cardiac: Regular rate and rhythm, no S3, soft systolic murmur. Abdomen: Soft, nontender, bowel sounds present. Extremities: No pitting edema,  distal pulses 2+. Skin: Warm and dry. Musculoskeletal: No kyphosis. Neuropsychiatric: Alert and oriented x3, affect grossly appropriate.  ECG: I personally reviewed the tracing from 02/09/2018 which showed a ventricular paced rhythm with atrial sensing.  Other Studies Reviewed Today:  Echocardiogram 11/16/2013: Study Conclusions  - Left ventricle: The cavity size was normal. Wall thickness was increased in a pattern of mild LVH. Systolic function was normal. The estimated ejection fraction was in the range of 60% to 65%. Wall motion was normal; there were no regional wall motion abnormalities. Doppler parameters are consistent with abnormal left ventricular relaxation (grade 1 diastolic dysfunction). Doppler parameters are consistent with high ventricular filling pressure. - Mitral valve: Mildly thickened leaflets . There was trivial regurgitation. - Tricuspid valve: There was mild regurgitation. - Pulmonic valve: There was mild regurgitation. - Pulmonary arteries: PA peak pressure: 33 mm Hg (S).  Assessment and Plan:  1.  Essential hypertension.  Continue Norvasc with follow-up per Dr. [name].  2.  History of complete heart block status post Medtronic pacemaker with follow-up per Dr. Lovena Le.  Current medicines were reviewed with the patient today.   Disposition: Follow-up in 1 year.  Signed, Satira Sark, MD, [facility] 02/23/2018 1:53 PM    [facility] [address] [place], [place] [address] Phone: [phone]; Fax: [phone]

## 2018-02-23 ENCOUNTER — Ambulatory Visit (INDEPENDENT_AMBULATORY_CARE_PROVIDER_SITE_OTHER): Payer: Medicare Other | Admitting: Cardiology

## 2018-02-23 ENCOUNTER — Encounter: Payer: Self-pay | Admitting: Cardiology

## 2018-02-23 VITALS — BP 150/60 | HR 84 | Ht 67.0 in | Wt 141.0 lb

## 2018-02-23 DIAGNOSIS — M170 Bilateral primary osteoarthritis of knee: Secondary | ICD-10-CM | POA: Diagnosis not present

## 2018-02-23 DIAGNOSIS — I10 Essential (primary) hypertension: Secondary | ICD-10-CM

## 2018-02-23 DIAGNOSIS — Z950 Presence of cardiac pacemaker: Secondary | ICD-10-CM | POA: Diagnosis not present

## 2018-02-23 DIAGNOSIS — I442 Atrioventricular block, complete: Secondary | ICD-10-CM | POA: Diagnosis not present

## 2018-02-23 DIAGNOSIS — M4808 Spinal stenosis, sacral and sacrococcygeal region: Secondary | ICD-10-CM | POA: Diagnosis not present

## 2018-02-23 NOTE — Patient Instructions (Signed)
Medication Instructions:  Your physician recommends that you continue on your current medications as directed. Please refer to the Current Medication list given to you today.  If you need a refill on your cardiac medications before your next appointment, please call your pharmacy.   Lab work: none If you have labs (blood work) drawn today and your tests are completely normal, you will receive your results only by: Marland Kitchen MyChart Message (if you have MyChart) OR . A paper copy in the mail If you have any lab test that is abnormal or we need to change your treatment, we will call you to review the results.  Testing/Procedures: none  Follow-Up: At [facility], you and your health needs are our priority.  As part of our continuing mission to provide you with exceptional heart care, we have created designated Provider Care Teams.  These Care Teams include your primary Cardiologist (physician) and Advanced Practice Providers (APPs -  Physician Assistants and Nurse Practitioners) who all work together to provide you with the care you need, when you need it. You will need a follow up appointment in 1 years.  Please call our office 2 months in advance to schedule this appointment.  You may see Rozann Lesches, MD or one of the following Advanced Practice Providers on your designated Care Team:   Bernerd Pho, PA-C [facility]) . Ermalinda Barrios, PA-C ([place])  Any Other Special Instructions Will Be Listed Below (If Applicable). None

## 2018-03-21 LAB — CUP PACEART INCLINIC DEVICE CHECK
Battery Impedance: 230 Ohm
Battery Remaining Longevity: 110 mo
Battery Voltage: 2.8 V
Brady Statistic AS VP Percent: 71 %
Date Time Interrogation Session: 20191119173354
Implantable Lead Implant Date: 20151009
Implantable Lead Location: 753859
Implantable Lead Model: 5076
Implantable Pulse Generator Implant Date: 20151009
Lead Channel Impedance Value: 490 Ohm
Lead Channel Impedance Value: 576 Ohm
Lead Channel Pacing Threshold Amplitude: 0.375 V
Lead Channel Pacing Threshold Amplitude: 0.75 V
Lead Channel Pacing Threshold Amplitude: 0.75 V
Lead Channel Pacing Threshold Pulse Width: 0.4 ms
Lead Channel Pacing Threshold Pulse Width: 0.4 ms
Lead Channel Setting Pacing Amplitude: 2 V
Lead Channel Setting Pacing Amplitude: 2.5 V
Lead Channel Setting Sensing Sensitivity: 2 mV
MDC IDC LEAD IMPLANT DT: 20151009
MDC IDC LEAD LOCATION: 753860
MDC IDC MSMT LEADCHNL RA PACING THRESHOLD PULSEWIDTH: 0.4 ms
MDC IDC MSMT LEADCHNL RA SENSING INTR AMPL: 1 mV
MDC IDC MSMT LEADCHNL RV PACING THRESHOLD AMPLITUDE: 0.5 V
MDC IDC MSMT LEADCHNL RV PACING THRESHOLD PULSEWIDTH: 0.4 ms
MDC IDC SET LEADCHNL RV PACING PULSEWIDTH: 0.4 ms
MDC IDC STAT BRADY AP VP PERCENT: 28 %
MDC IDC STAT BRADY AP VS PERCENT: 0 %
MDC IDC STAT BRADY AS VS PERCENT: 1 %

## 2018-04-07 DIAGNOSIS — M1711 Unilateral primary osteoarthritis, right knee: Secondary | ICD-10-CM | POA: Diagnosis not present

## 2018-04-15 ENCOUNTER — Ambulatory Visit (INDEPENDENT_AMBULATORY_CARE_PROVIDER_SITE_OTHER): Payer: Medicare Other

## 2018-04-15 DIAGNOSIS — I442 Atrioventricular block, complete: Secondary | ICD-10-CM | POA: Diagnosis not present

## 2018-04-16 ENCOUNTER — Encounter: Payer: Self-pay | Admitting: Cardiology

## 2018-04-16 NOTE — Progress Notes (Signed)
Remote pacemaker transmission.   

## 2018-04-18 LAB — CUP PACEART REMOTE DEVICE CHECK
Battery Impedance: 254 Ohm
Brady Statistic AP VP Percent: 24 %
Brady Statistic AS VP Percent: 74 %
Brady Statistic AS VS Percent: 1 %
Implantable Lead Implant Date: 20151009
Implantable Lead Location: 753859
Implantable Lead Model: 5076
Implantable Pulse Generator Implant Date: 20151009
Lead Channel Impedance Value: 476 Ohm
Lead Channel Impedance Value: 588 Ohm
Lead Channel Pacing Threshold Amplitude: 0.625 V
Lead Channel Pacing Threshold Pulse Width: 0.4 ms
Lead Channel Pacing Threshold Pulse Width: 0.4 ms
Lead Channel Setting Pacing Amplitude: 2.5 V
MDC IDC LEAD IMPLANT DT: 20151009
MDC IDC LEAD LOCATION: 753860
MDC IDC MSMT BATTERY REMAINING LONGEVITY: 107 mo
MDC IDC MSMT BATTERY VOLTAGE: 2.8 V
MDC IDC MSMT LEADCHNL RA PACING THRESHOLD AMPLITUDE: 0.375 V
MDC IDC SESS DTM: 20200123143730
MDC IDC SET LEADCHNL RA PACING AMPLITUDE: 2 V
MDC IDC SET LEADCHNL RV PACING PULSEWIDTH: 0.4 ms
MDC IDC SET LEADCHNL RV SENSING SENSITIVITY: 2 mV
MDC IDC STAT BRADY AP VS PERCENT: 0 %

## 2018-04-25 IMAGING — DX DG FOREARM 2V*L*
2 series · 2 of 2 positions shown · non-contrast
Comparison: None.

CLINICAL DATA: Fall, pain

EXAM:
LEFT FOREARM - 2 VIEW

[forearm ap]
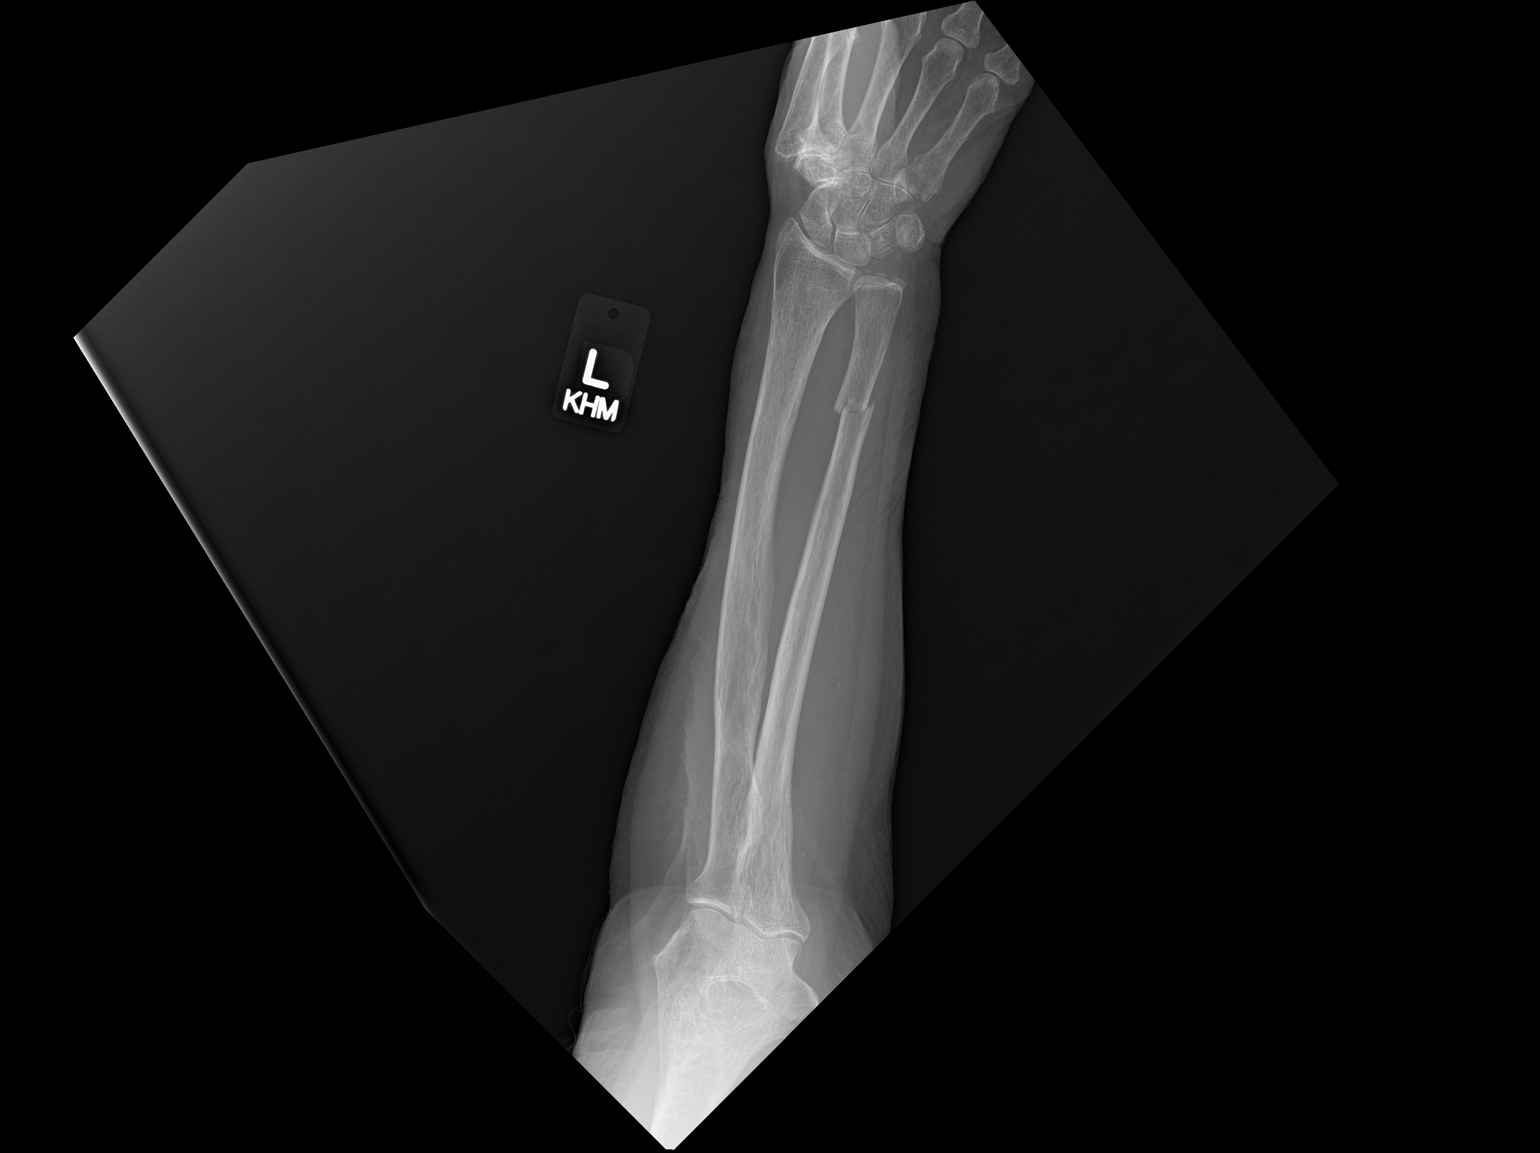

[forearm lat]
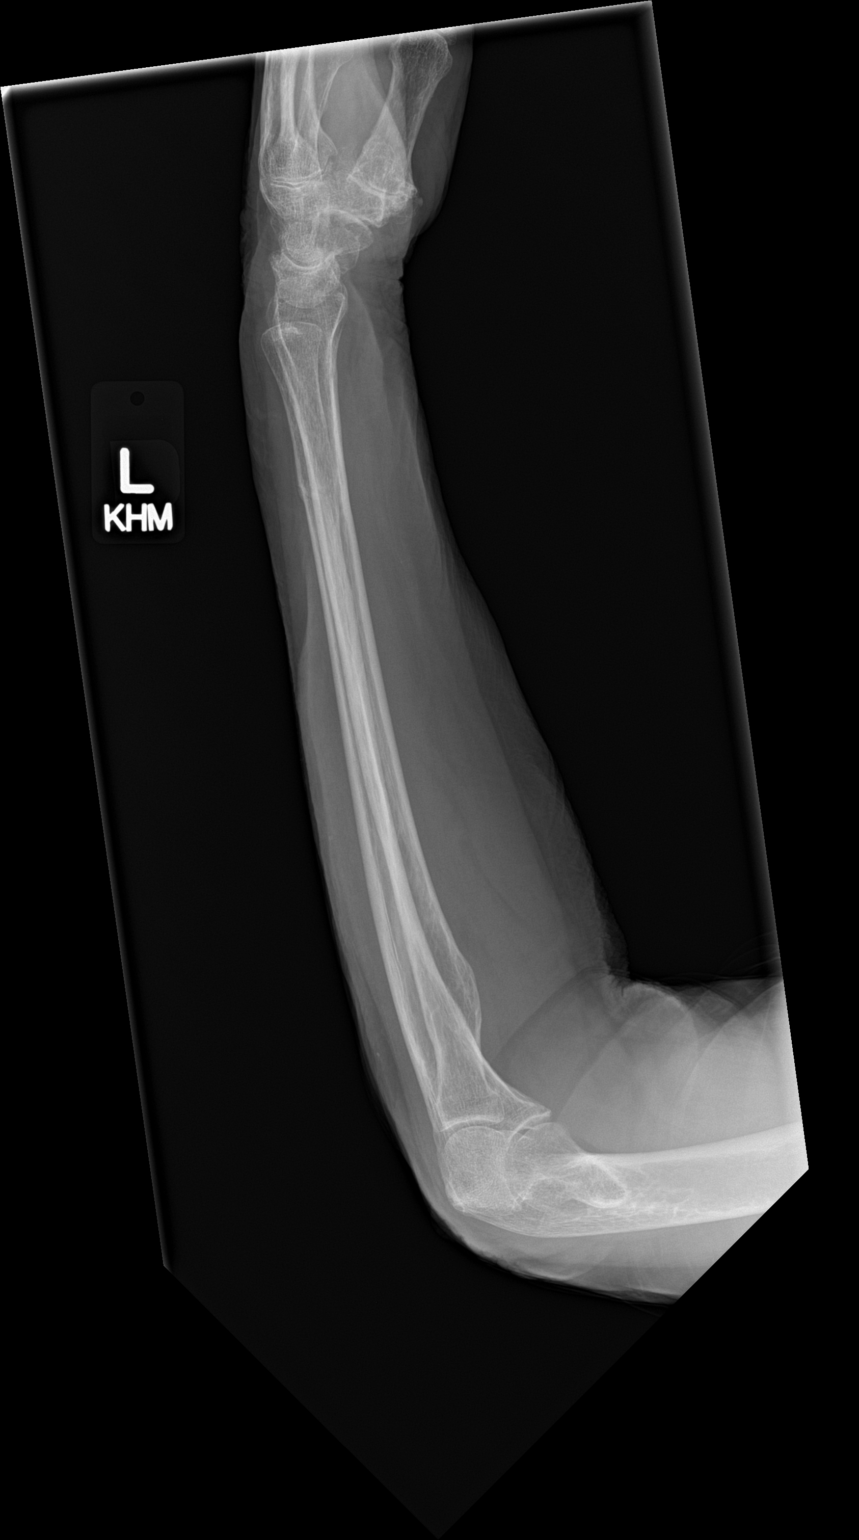

[2 of 2 positions shown; findings below may reference images not displayed]

FINDINGS: Fracture through the distal ulnar shaft, minimally displaced. No
visible radial abnormality. No subluxation or dislocation. Arthritic
changes in the left wrist.
IMPRESSION: Minimally displaced left distal ulnar shaft fracture.

## 2018-07-15 ENCOUNTER — Ambulatory Visit (INDEPENDENT_AMBULATORY_CARE_PROVIDER_SITE_OTHER): Payer: Medicare Other | Admitting: *Deleted

## 2018-07-15 ENCOUNTER — Other Ambulatory Visit: Payer: Self-pay

## 2018-07-15 DIAGNOSIS — I442 Atrioventricular block, complete: Secondary | ICD-10-CM | POA: Diagnosis not present

## 2018-07-15 DIAGNOSIS — E782 Mixed hyperlipidemia: Secondary | ICD-10-CM

## 2018-07-15 LAB — CUP PACEART REMOTE DEVICE CHECK
Battery Impedance: 254 Ohm
Battery Remaining Longevity: 105 mo
Battery Voltage: 2.8 V
Brady Statistic AP VP Percent: 24 %
Brady Statistic AP VS Percent: 0 %
Brady Statistic AS VP Percent: 75 %
Brady Statistic AS VS Percent: 1 %
Date Time Interrogation Session: 20200423124857
Implantable Lead Implant Date: 20151009
Implantable Lead Implant Date: 20151009
Implantable Lead Location: 753859
Implantable Lead Location: 753860
Implantable Lead Model: 5076
Implantable Lead Model: 5076
Implantable Pulse Generator Implant Date: 20151009
Lead Channel Impedance Value: 457 Ohm
Lead Channel Impedance Value: 539 Ohm
Lead Channel Pacing Threshold Amplitude: 0.375 V
Lead Channel Pacing Threshold Amplitude: 0.75 V
Lead Channel Pacing Threshold Pulse Width: 0.4 ms
Lead Channel Pacing Threshold Pulse Width: 0.4 ms
Lead Channel Sensing Intrinsic Amplitude: 0.7 mV
Lead Channel Setting Pacing Amplitude: 2 V
Lead Channel Setting Pacing Amplitude: 2.5 V
Lead Channel Setting Pacing Pulse Width: 0.4 ms
Lead Channel Setting Sensing Sensitivity: 2 mV

## 2018-07-23 NOTE — Progress Notes (Signed)
Remote pacemaker transmission.   

## 2018-10-14 ENCOUNTER — Encounter: Payer: Medicare Other | Admitting: *Deleted

## 2018-10-18 ENCOUNTER — Telehealth: Payer: Self-pay

## 2018-10-18 NOTE — Telephone Encounter (Signed)
Left message for patient to remind of missed remote transmission.  

## 2018-10-26 ENCOUNTER — Ambulatory Visit (INDEPENDENT_AMBULATORY_CARE_PROVIDER_SITE_OTHER): Payer: Medicare Other | Admitting: *Deleted

## 2018-10-26 DIAGNOSIS — I442 Atrioventricular block, complete: Secondary | ICD-10-CM | POA: Diagnosis not present

## 2018-10-26 LAB — CUP PACEART REMOTE DEVICE CHECK
Battery Impedance: 278 Ohm
Battery Remaining Longevity: 105 mo
Battery Voltage: 2.8 V
Brady Statistic AP VP Percent: 23 %
Brady Statistic AP VS Percent: 0 %
Brady Statistic AS VP Percent: 76 %
Brady Statistic AS VS Percent: 1 %
Date Time Interrogation Session: 20200804141931
Implantable Lead Implant Date: 20151009
Implantable Lead Implant Date: 20151009
Implantable Lead Location: 753859
Implantable Lead Location: 753860
Implantable Lead Model: 5076
Implantable Lead Model: 5076
Implantable Pulse Generator Implant Date: 20151009
Lead Channel Impedance Value: 490 Ohm
Lead Channel Impedance Value: 627 Ohm
Lead Channel Pacing Threshold Amplitude: 0.375 V
Lead Channel Pacing Threshold Amplitude: 0.5 V
Lead Channel Pacing Threshold Pulse Width: 0.4 ms
Lead Channel Pacing Threshold Pulse Width: 0.4 ms
Lead Channel Setting Pacing Amplitude: 2 V
Lead Channel Setting Pacing Amplitude: 2.5 V
Lead Channel Setting Pacing Pulse Width: 0.4 ms
Lead Channel Setting Sensing Sensitivity: 2 mV

## 2018-11-02 ENCOUNTER — Encounter: Payer: Self-pay | Admitting: Cardiology

## 2018-11-02 NOTE — Progress Notes (Signed)
Remote pacemaker transmission.   

## 2019-01-13 ENCOUNTER — Ambulatory Visit (INDEPENDENT_AMBULATORY_CARE_PROVIDER_SITE_OTHER): Payer: Medicare Other | Admitting: Otolaryngology

## 2019-01-13 DIAGNOSIS — H903 Sensorineural hearing loss, bilateral: Secondary | ICD-10-CM

## 2019-01-13 DIAGNOSIS — H6121 Impacted cerumen, right ear: Secondary | ICD-10-CM | POA: Diagnosis not present

## 2019-01-25 ENCOUNTER — Ambulatory Visit (INDEPENDENT_AMBULATORY_CARE_PROVIDER_SITE_OTHER): Payer: Medicare Other | Admitting: *Deleted

## 2019-01-25 DIAGNOSIS — I442 Atrioventricular block, complete: Secondary | ICD-10-CM | POA: Diagnosis not present

## 2019-01-26 LAB — CUP PACEART REMOTE DEVICE CHECK
Battery Impedance: 326 Ohm
Battery Remaining Longevity: 98 mo
Battery Voltage: 2.8 V
Brady Statistic AP VP Percent: 23 %
Brady Statistic AP VS Percent: 0 %
Brady Statistic AS VP Percent: 76 %
Brady Statistic AS VS Percent: 1 %
Date Time Interrogation Session: 20201104113939
Implantable Lead Implant Date: 20151009
Implantable Lead Implant Date: 20151009
Implantable Lead Location: 753859
Implantable Lead Location: 753860
Implantable Lead Model: 5076
Implantable Lead Model: 5076
Implantable Pulse Generator Implant Date: 20151009
Lead Channel Impedance Value: 490 Ohm
Lead Channel Impedance Value: 547 Ohm
Lead Channel Pacing Threshold Amplitude: 0.375 V
Lead Channel Pacing Threshold Amplitude: 0.625 V
Lead Channel Pacing Threshold Pulse Width: 0.4 ms
Lead Channel Pacing Threshold Pulse Width: 0.4 ms
Lead Channel Setting Pacing Amplitude: 2 V
Lead Channel Setting Pacing Amplitude: 2.5 V
Lead Channel Setting Pacing Pulse Width: 0.4 ms
Lead Channel Setting Sensing Sensitivity: 2 mV

## 2019-02-06 NOTE — Progress Notes (Signed)
Cardiology Office Note Date:  02/09/2019  Patient ID:  Laura, Martin 1926-01-03, MRN [id number] PCP:  Sinda Du, MD  Cardiologist:  Dr. Domenic Polite Electrophysiologist: Dr. Lovena Le     Chief Complaint:  annual visit  History of Present Illness: Laura Martin is a 85 y.o. female with history of HTN, HLD, CHB w/PPM  She comes in today to be seen for Dr. Lovena Le.  Last seen by him Nov 2019, at that time having knee pain/issues, foing well otherwise.  No changes were made to her medicines or device programming. She saw Dr. Domenic Polite in Dec again, doing well, no changes were made.  She is doing quite well.  She lives with her husband, married 75 years.  They care for their own home.  She gets help that comes in every 2 weeks for the heavier work.  She walks the dog in the back yard, denies any difficulties with her ADLs.  No CP, palpitations or SOB, no DOE.  No dizzy spells, no near syncope or syncope  She sees her PMD annually, her labs,lipids monitored there  Device information MDT dual chamber PPM, implanted 12/30/13   Past Medical History:  Diagnosis Date  . Complete heart block ([place])    a. s/p PPM 12/31/13:  Medtronic Adapta L (serial number [id number]) pacemaker  . Essential hypertension   . Goiter   . Mixed hyperlipidemia   . Spinal stenosis   . Thyroid nodule     Past Surgical History:  Procedure Laterality Date  . BIOPSY THYROID Left 11/2006  . BREAST BIOPSY Right 1990's   "calcium deposit"  . CATARACT EXTRACTION W/ INTRAOCULAR LENS  IMPLANT, BILATERAL Bilateral 2001  . DILATION AND CURETTAGE OF UTERUS     S/P miscarriage  . entropion  11/04/2017   right eye  . ESOPHAGEAL DILATION N/A 03/08/2015   Procedure: ESOPHAGEAL DILATION;  Surgeon: Rogene Houston, MD;  [facility];  Service: Endoscopy;  Laterality: N/A;  . ESOPHAGOGASTRODUODENOSCOPY N/A 03/08/2015   Procedure: ESOPHAGOGASTRODUODENOSCOPY (EGD);  Surgeon: Rogene Houston, MD;   [facility];  Service: Endoscopy;  Laterality: N/A;  3:00  . INSERT / REPLACE / REMOVE PACEMAKER  12/30/2013  . KNEE ARTHROSCOPY Left 1986  . PERMANENT PACEMAKER INSERTION N/A 12/30/2013   Procedure: PERMANENT PACEMAKER INSERTION;  Surgeon: Evans Lance, MD;  Location[facility] LAB;  Service: Cardiovascular;  Laterality: N/A;  . TONSILLECTOMY  ~ 1943  . TOTAL KNEE ARTHROPLASTY Left 09/2010    Current Outpatient Medications  Medication Sig Dispense Refill  . acetaminophen (TYLENOL) 500 MG tablet Take 500 mg by mouth every 6 (six) hours as needed.    Marland Kitchen amLODipine (NORVASC) 10 MG tablet Take 10 mg by mouth daily.    . Calcium Carbonate (CALCIUM 600 PO) Take 600 mg by mouth daily. Takes 3 tabs daily    . cholecalciferol (VITAMIN D) 400 UNITS TABS tablet Take 400 Units by mouth.    . Coenzyme Q10 (CO Q 10) 100 MG CAPS Take 100 mg by mouth daily.    . diphenhydrAMINE (BENADRYL) 25 MG tablet Take 25 mg by mouth at bedtime as needed for sleep.    . fish oil-omega-3 fatty acids 1000 MG capsule Take 1 g by mouth daily.    . naproxen sodium (ANAPROX) 220 MG tablet Take 220 mg by mouth at bedtime as needed.     No current facility-administered medications for this visit.     Allergies:   Codeine,  Tramadol, and Sulfa antibiotics   Social History:  The patient  reports that she has never smoked. She has never used smokeless tobacco. She reports that she does not drink alcohol or use drugs.   Family History:  The patient's family history includes Cancer in her unknown relative; Stroke in her unknown relative.  ROS:  Please see the history of present illness.    All other systems are reviewed and otherwise negative.   PHYSICAL EXAM:  VS:  BP (!) 144/68   Pulse 83   Ht 5\' 7"  (1.702 m)   Wt 135 lb (61.2 kg)   BMI 21.14 kg/m  BMI: Body mass index is 21.14 kg/m. Well nourished, well developed, in no acute distress, looks younger then her age 26: normocephalic, atraumatic  Neck: no  JVD, carotid bruits or masses Cardiac:  RRR; no significant murmurs, no rubs, or gallops Lungs: CTA b/l, no wheezing, rhonchi or rales  Abd: soft, nontender MS: no deformity or atrophy Ext:  no edema  Skin: warm and dry, no rash Neuro:  No gross deficits appreciated Psych: euthymic mood, full affect   PPM site is stable, no tethering or discomfort   EKG:  Done today and reviewed by myself shows SR, V paced PPM interrogation done today and reviewed by myself;  Battery and lead measurements are good 2 HVR  3 and 4 seconds (7 and 11 beats)  AMS 57, none longe enough to create EGM, <1%  11/16/2013: TTE Study Conclusions  - Left ventricle: The cavity size was normal. Wall thickness was  increased in a pattern of mild LVH. Systolic function was normal.  The estimated ejection fraction was in the range of 60% to 65%.  Wall motion was normal; there were no regional wall motion  abnormalities. Doppler parameters are consistent with abnormal  left ventricular relaxation (grade 1 diastolic dysfunction).  Doppler parameters are consistent with high ventricular filling  pressure.  - Mitral valve: Mildly thickened leaflets . There was trivial  regurgitation.  - Tricuspid valve: There was mild regurgitation.  - Pulmonic valve: There was mild regurgitation.  - Pulmonary arteries: PA peak pressure: 33 mm Hg (S).     Recent Labs: No results found for requested labs within last 8760 hours.  No results found for requested labs within last 8760 hours.   CrCl cannot be calculated (Patient's most recent lab result is older than the maximum 21 days allowed.).   Wt Readings from Last 3 Encounters:  02/09/19 135 lb (61.2 kg)  02/23/18 141 lb (64 kg)  02/09/18 138 lb 3.2 oz (62.7 kg)     Other studies reviewed: Additional studies/records reviewed today include: summarized above  ASSESSMENT AND PLAN:  1. PPM     Intact function, no changes made  2. HTN      Looks OK, no  changes today    Disposition: F/u with Dr. Skeet Latch as usual, her PMD.  Continue Q 3 mo remotes and in-clinic in 1 year, sooner if needed  Current medicines are reviewed at length with the patient today.  The patient did not have any concerns regarding medicines.  Venetia Night, PA-C 02/09/2019 1:49 PM     [place] [address] [phone] (office)  [phone] (fax)

## 2019-02-09 ENCOUNTER — Ambulatory Visit (INDEPENDENT_AMBULATORY_CARE_PROVIDER_SITE_OTHER): Payer: Medicare Other | Admitting: Physician Assistant

## 2019-02-09 ENCOUNTER — Other Ambulatory Visit: Payer: Self-pay

## 2019-02-09 VITALS — BP 144/68 | HR 83 | Ht 67.0 in | Wt 135.0 lb

## 2019-02-09 DIAGNOSIS — I442 Atrioventricular block, complete: Secondary | ICD-10-CM | POA: Diagnosis not present

## 2019-02-09 DIAGNOSIS — I441 Atrioventricular block, second degree: Secondary | ICD-10-CM | POA: Diagnosis not present

## 2019-02-09 DIAGNOSIS — I10 Essential (primary) hypertension: Secondary | ICD-10-CM | POA: Diagnosis not present

## 2019-02-09 DIAGNOSIS — Z950 Presence of cardiac pacemaker: Secondary | ICD-10-CM

## 2019-02-09 NOTE — Patient Instructions (Addendum)
Medication Instructions:  Your physician recommends that you continue on your current medications as directed. Please refer to the Current Medication list given to you today.  *If you need a refill on your cardiac medications before your next appointment, please call your pharmacy*  Lab Work: [place]   If you have labs (blood work) drawn today and your tests are completely normal, you will receive your results only by: Marland Kitchen MyChart Message (if you have MyChart) OR . A paper copy in the mail If you have any lab test that is abnormal or we need to change your treatment, we will call you to review the results.  Testing/Procedures: NONE ORDERED  TODAY   Follow-Up: At [facility]: [facility], you and your health needs are our priority.  As part of our continuing mission to provide you with exceptional heart care, we have created designated Provider Care Teams.  These Care Teams include your primary Cardiologist (physician) and Advanced Practice Providers (APPs -  Physician Assistants and Nurse Practitioners) who all work together to provide you with the care you need, when you need it.  Your next appointment:   12 month(s)  The format for your next appointment:   In Person  Provider:   You may see  or one of the following Advanced Practice Providers on your designated Care Team:    Chanetta Marshall, NP  Tommye Standard, PA-C  Legrand Como "Oda Kilts, [place]   Other Instructions

## 2019-02-14 NOTE — Progress Notes (Signed)
Remote pacemaker transmission.   

## 2019-02-23 ENCOUNTER — Other Ambulatory Visit: Payer: Self-pay

## 2019-02-23 ENCOUNTER — Ambulatory Visit (INDEPENDENT_AMBULATORY_CARE_PROVIDER_SITE_OTHER): Payer: Medicare Other | Admitting: Cardiology

## 2019-02-23 ENCOUNTER — Encounter: Payer: Self-pay | Admitting: Cardiology

## 2019-02-23 VITALS — BP 157/79 | HR 87 | Temp 96.9°F | Ht 63.0 in | Wt 135.0 lb

## 2019-02-23 DIAGNOSIS — I10 Essential (primary) hypertension: Secondary | ICD-10-CM | POA: Diagnosis not present

## 2019-02-23 DIAGNOSIS — I442 Atrioventricular block, complete: Secondary | ICD-10-CM

## 2019-02-23 NOTE — Patient Instructions (Signed)
Medication Instructions:  Your physician recommends that you continue on your current medications as directed. Please refer to the Current Medication list given to you today.  *If you need a refill on your cardiac medications before your next appointment, please call your pharmacy*  Lab Work: None today If you have labs (blood work) drawn today and your tests are completely normal, you will receive your results only by: . MyChart Message (if you have MyChart) OR . A paper copy in the mail If you have any lab test that is abnormal or we need to change your treatment, we will call you to review the results.  Testing/Procedures: None today  Follow-Up: At [facility], you and your health needs are our priority.  As part of our continuing mission to provide you with exceptional heart care, we have created designated Provider Care Teams.  These Care Teams include your primary Cardiologist (physician) and Advanced Practice Providers (APPs -  Physician Assistants and Nurse Practitioners) who all work together to provide you with the care you need, when you need it.  Your next appointment:   12 month(s)  The format for your next appointment:   In Person  Provider:   Samuel McDowell, MD  Other Instructions None     Thank you for choosing [facility] !        

## 2019-02-23 NOTE — Progress Notes (Signed)
Cardiology Office Note  Date: 02/23/2019   ID: Laura Martin, DOB 01/02/1926, MRN [id number]  PCP:  Sinda Du, MD  Cardiologist:  Rozann Lesches, MD Electrophysiologist:  None   Chief Complaint  Patient presents with  . Cardiac follow-up    History of Present Illness: Laura Martin is a 85 y.o. female last seen in December 2019.  She is here for a routine visit.  Still lives in her own home and functions with ADLs.  She admits that she has been having some trouble with her memory.  She has several family members that live close by and also helps.  She continues to see Dr. Lovena Le in the device clinic, Medtronic pacemaker in place.  She had a recent follow-up visit with normal device function.  She has had no dizziness or syncope.  I reviewed her medications which are outlined below.  She continues on Norvasc.  She will be transitioning to Dr. Holly Bodily following Dr. [name]' retirement.  Past Medical History:  Diagnosis Date  . Complete heart block ([place])    a. s/p PPM 12/31/13:  Medtronic Adapta L (serial number [id number]) pacemaker  . Essential hypertension   . Goiter   . Mixed hyperlipidemia   . Spinal stenosis   . Thyroid nodule     Past Surgical History:  Procedure Laterality Date  . BIOPSY THYROID Left 11/2006  . BREAST BIOPSY Right 1990's   "calcium deposit"  . CATARACT EXTRACTION W/ INTRAOCULAR LENS  IMPLANT, BILATERAL Bilateral 2001  . DILATION AND CURETTAGE OF UTERUS     S/P miscarriage  . entropion  11/04/2017   right eye  . ESOPHAGEAL DILATION N/A 03/08/2015   Procedure: ESOPHAGEAL DILATION;  Surgeon: Rogene Houston, MD;  [facility];  Service: Endoscopy;  Laterality: N/A;  . ESOPHAGOGASTRODUODENOSCOPY N/A 03/08/2015   Procedure: ESOPHAGOGASTRODUODENOSCOPY (EGD);  Surgeon: Rogene Houston, MD;  [facility];  Service: Endoscopy;  Laterality: N/A;  3:00  . INSERT / REPLACE / REMOVE PACEMAKER  12/30/2013  . KNEE ARTHROSCOPY  Left 1986  . PERMANENT PACEMAKER INSERTION N/A 12/30/2013   Procedure: PERMANENT PACEMAKER INSERTION;  Surgeon: Evans Lance, MD;  Location[facility] LAB;  Service: Cardiovascular;  Laterality: N/A;  . TONSILLECTOMY  ~ 1943  . TOTAL KNEE ARTHROPLASTY Left 09/2010    Current Outpatient Medications  Medication Sig Dispense Refill  . acetaminophen (TYLENOL) 500 MG tablet Take 500 mg by mouth every 6 (six) hours as needed.    Marland Kitchen amLODipine (NORVASC) 10 MG tablet Take 10 mg by mouth daily.    . Calcium Carbonate (CALCIUM 600 PO) Take 600 mg by mouth daily. Takes 3 tabs daily    . cholecalciferol (VITAMIN D) 400 UNITS TABS tablet Take 400 Units by mouth.    . Coenzyme Q10 (CO Q 10) 100 MG CAPS Take 100 mg by mouth daily.    . diphenhydrAMINE (BENADRYL) 25 MG tablet Take 25 mg by mouth at bedtime as needed for sleep.    . fish oil-omega-3 fatty acids 1000 MG capsule Take 1 g by mouth daily.    . naproxen sodium (ANAPROX) 220 MG tablet Take 220 mg by mouth at bedtime as needed.     No current facility-administered medications for this visit.    Allergies:  Codeine, Tramadol, and Sulfa antibiotics   Social History: The patient  reports that she has never smoked. She has never used smokeless tobacco. She reports that she does not drink alcohol  or use drugs.   ROS:  Please see the history of present illness. Otherwise, complete review of systems is positive for arthritic stiffness.  All other systems are reviewed and negative.   Physical Exam: VS:  BP (!) 157/79   Pulse 87   Temp (!) 96.9 F (36.1 C)   Ht 5\' 3"  (1.6 m)   Wt 135 lb (61.2 kg)   SpO2 97%   BMI 23.91 kg/m , BMI Body mass index is 23.91 kg/m.  Wt Readings from Last 3 Encounters:  02/23/19 135 lb (61.2 kg)  02/09/19 135 lb (61.2 kg)  02/23/18 141 lb (64 kg)    General: Elderly woman, appears comfortable at rest. HEENT: Conjunctiva and lids normal, wearing a mask. Neck: Supple, no elevated JVP or carotid bruits, no  thyromegaly. Lungs: Clear to auscultation, nonlabored breathing at rest. Cardiac: Regular rate and rhythm, no S3, soft systolic murmur. Abdomen: Soft, nontender, bowel sounds present. Extremities: No pitting edema, distal pulses 2+. Skin: Warm and dry. Musculoskeletal: No kyphosis. Neuropsychiatric: Alert and oriented x3, affect grossly appropriate.  ECG:  An ECG dated 02/09/2019 was personally reviewed today and demonstrated:  Ventricular paced rhythm with atrial sensing.  Recent Labwork:  No interval lab work for review.  Other Studies Reviewed Today:  Echocardiogram 11/16/2013: Study Conclusions  - Left ventricle: The cavity size was normal. Wall thickness was increased in a pattern of mild LVH. Systolic function was normal. The estimated ejection fraction was in the range of 60% to 65%. Wall motion was normal; there were no regional wall motion abnormalities. Doppler parameters are consistent with abnormal left ventricular relaxation (grade 1 diastolic dysfunction). Doppler parameters are consistent with high ventricular filling pressure. - Mitral valve: Mildly thickened leaflets . There was trivial regurgitation. - Tricuspid valve: There was mild regurgitation. - Pulmonic valve: There was mild regurgitation. - Pulmonary arteries: PA peak pressure: 33 mm Hg (S).  Assessment and Plan:  1.  Essential hypertension.  Systolic is in the Q000111Q today, she reports compliance with Norvasc.  No additions made, will keep follow-up with PCP, limit sodium in the diet.  2.  Complete heart block status post Medtronic pacemaker.  She is asymptomatic and continues to follow with Dr. Lovena Le.  Medication Adjustments/Labs and Tests Ordered: Current medicines are reviewed at length with the patient today.  Concerns regarding medicines are outlined above.   Tests Ordered: No orders of the defined types were placed in this encounter.   Medication Changes: No orders of the  defined types were placed in this encounter.   Disposition:  Follow up 1 year in the [place] office.  Signed, Satira Sark, MD, [facility] 02/23/2019 2:52 PM    [place] [facility] [address], [place],  [address] Phone: [phone]; Fax: [phone]

## 2019-04-05 ENCOUNTER — Emergency Department (HOSPITAL_COMMUNITY): Payer: Medicare Other

## 2019-04-05 ENCOUNTER — Encounter (HOSPITAL_COMMUNITY): Payer: Self-pay | Admitting: Emergency Medicine

## 2019-04-05 ENCOUNTER — Emergency Department (HOSPITAL_COMMUNITY)
Admission: EM | Admit: 2019-04-05 | Discharge: 2019-04-05 | Disposition: A | Discharge status: Home / Self Care | Payer: Medicare Other | Attending: Emergency Medicine | Admitting: Emergency Medicine

## 2019-04-05 ENCOUNTER — Other Ambulatory Visit: Payer: Self-pay

## 2019-04-05 DIAGNOSIS — W01198A Fall on same level from slipping, tripping and stumbling with subsequent striking against other object, initial encounter: Secondary | ICD-10-CM | POA: Insufficient documentation

## 2019-04-05 DIAGNOSIS — I10 Essential (primary) hypertension: Secondary | ICD-10-CM | POA: Diagnosis not present

## 2019-04-05 DIAGNOSIS — S52502A Unspecified fracture of the lower end of left radius, initial encounter for closed fracture: Secondary | ICD-10-CM | POA: Insufficient documentation

## 2019-04-05 DIAGNOSIS — Y999 Unspecified external cause status: Secondary | ICD-10-CM | POA: Insufficient documentation

## 2019-04-05 DIAGNOSIS — S59912A Unspecified injury of left forearm, initial encounter: Secondary | ICD-10-CM | POA: Diagnosis present

## 2019-04-05 DIAGNOSIS — I442 Atrioventricular block, complete: Secondary | ICD-10-CM | POA: Diagnosis not present

## 2019-04-05 DIAGNOSIS — Z950 Presence of cardiac pacemaker: Secondary | ICD-10-CM | POA: Diagnosis not present

## 2019-04-05 DIAGNOSIS — S52602K Unspecified fracture of lower end of left ulna, subsequent encounter for closed fracture with nonunion: Secondary | ICD-10-CM | POA: Diagnosis not present

## 2019-04-05 DIAGNOSIS — Y9389 Activity, other specified: Secondary | ICD-10-CM | POA: Diagnosis not present

## 2019-04-05 DIAGNOSIS — Y92008 Other place in unspecified non-institutional (private) residence as the place of occurrence of the external cause: Secondary | ICD-10-CM | POA: Diagnosis not present

## 2019-04-05 DIAGNOSIS — Z79899 Other long term (current) drug therapy: Secondary | ICD-10-CM | POA: Insufficient documentation

## 2019-04-05 MED ORDER — HYDROCODONE-ACETAMINOPHEN 5-325 MG PO TABS: 2.0000 | ORAL_TABLET | ORAL | 0 refills | Status: DC | PRN

## 2019-04-05 NOTE — ED Provider Notes (Signed)
[facility] EMERGENCY DEPARTMENT Provider Note   CSN: [id number] Arrival date & time: 04/05/19  1825     History Chief Complaint  Patient presents with  . Arm Injury    Laura Martin is a 86 y.o. female with a history as outlined below and including a left radial fracture from 2019 presenting with a fall with pain and swelling of her left forearm.  She describes stubbing her toe as she was coming into her home this evening fell forward landing on her outstretched left hand.  She had sudden onset of pain in the wrist.  She denies weakness or numbness in her hand or fingertips.  She does have significant pain with attempts at range of motion of the wrist and hand.  She denies any other injury including head injury and had no LOC at the time of the event.  She has had no treatment for her injury prior to arrival.  The history is provided by the patient.       Past Medical History:  Diagnosis Date  . Complete heart block ([place])    a. s/p PPM 12/31/13:  Medtronic Adapta L (serial number [id number]) pacemaker  . Essential hypertension   . Goiter   . Mixed hyperlipidemia   . Spinal stenosis   . Thyroid nodule     Patient Active Problem List   Diagnosis Date Noted  . Pacemaker 05/12/2014  . Complete heart block ([place]) 01/01/2014  . Mobitz type 2 second degree AV block 12/30/2013  . Heart murmur 11/11/2013  . Essential hypertension, benign 05/27/2011  . Mixed hyperlipidemia 05/27/2011    Past Surgical History:  Procedure Laterality Date  . BIOPSY THYROID Left 11/2006  . BREAST BIOPSY Right 1990's   "calcium deposit"  . CATARACT EXTRACTION W/ INTRAOCULAR LENS  IMPLANT, BILATERAL Bilateral 2001  . DILATION AND CURETTAGE OF UTERUS     S/P miscarriage  . entropion  11/04/2017   right eye  . ESOPHAGEAL DILATION N/A 03/08/2015   Procedure: ESOPHAGEAL DILATION;  Surgeon: Rogene Houston, MD;  [facility];  Service: Endoscopy;  Laterality: N/A;  .  ESOPHAGOGASTRODUODENOSCOPY N/A 03/08/2015   Procedure: ESOPHAGOGASTRODUODENOSCOPY (EGD);  Surgeon: Rogene Houston, MD;  [facility];  Service: Endoscopy;  Laterality: N/A;  3:00  . INSERT / REPLACE / REMOVE PACEMAKER  12/30/2013  . KNEE ARTHROSCOPY Left 1986  . PERMANENT PACEMAKER INSERTION N/A 12/30/2013   Procedure: PERMANENT PACEMAKER INSERTION;  Surgeon: Evans Lance, MD;  Location[facility] CATH LAB;  Service: Cardiovascular;  Laterality: N/A;  . TONSILLECTOMY  ~ 1943  . TOTAL KNEE ARTHROPLASTY Left 09/2010     OB History   No obstetric history on file.     Family History  Problem Relation Age of Onset  . Cancer Other   . Stroke Other     Social History   Tobacco Use  . Smoking status: Never Smoker  . Smokeless tobacco: Never Used  Substance Use Topics  . Alcohol use: No    Alcohol/week: 0.0 standard drinks  . Drug use: No    Home Medications Prior to Admission medications   Medication Sig Start Date End Date Taking? Authorizing Provider  acetaminophen (TYLENOL) 500 MG tablet Take 500 mg by mouth every 6 (six) hours as needed.    [provider]  amLODipine (NORVASC) 10 MG tablet Take 10 mg by mouth daily.    [provider]  Calcium Carbonate (CALCIUM 600 PO) Take 600 mg by  mouth daily. Takes 3 tabs daily    [provider]  cholecalciferol (VITAMIN D) 400 UNITS TABS tablet Take 400 Units by mouth.    [provider]  Coenzyme Q10 (CO Q 10) 100 MG CAPS Take 100 mg by mouth daily.    [provider]  diphenhydrAMINE (BENADRYL) 25 MG tablet Take 25 mg by mouth at bedtime as needed for sleep.    [provider]  fish oil-omega-3 fatty acids 1000 MG capsule Take 1 g by mouth daily.    [provider]  HYDROcodone-acetaminophen (NORCO/VICODIN) 5-325 MG tablet Take 2 tablets by mouth every 4 (four) hours as needed. 04/05/19   Evalee Jefferson, PA-C  naproxen sodium (ANAPROX) 220 MG tablet Take 220 mg by mouth at  bedtime as needed.    [provider]    Allergies    Codeine, Tramadol, and Sulfa antibiotics  Review of Systems   Review of Systems  Constitutional: Negative.   HENT: Negative.   Respiratory: Negative.   Cardiovascular: Negative.   Gastrointestinal: Negative.   Musculoskeletal: Positive for arthralgias and joint swelling. Negative for myalgias.  Neurological: Negative for dizziness, weakness, numbness and headaches.    Physical Exam Updated Vital Signs BP (!) 172/75 (BP Location: Right Arm)   Pulse 70   Temp 98.1 F (36.7 C) (Oral)   Resp 18   Ht 5\' 7"  (1.702 m)   Wt 62.1 kg   SpO2 99%   BMI 21.46 kg/m   Physical Exam Constitutional:      Appearance: She is well-developed.  HENT:     Head: Normocephalic and atraumatic.  Cardiovascular:     Rate and Rhythm: Normal rate.     Pulses:          Radial pulses are 2+ on the right side and 2+ on the left side.     Comments: Pulses equal bilaterally Pulmonary:     Effort: Pulmonary effort is normal.  Musculoskeletal:        General: Swelling and tenderness present.     Left forearm: Swelling and bony tenderness present. No lacerations.     Cervical back: Normal range of motion.     Comments: Distal left forearm ttp with edema at the medial and lateral wrist.  Distal sensation intact .  Pt can flex/ext fingers without pain.   Elbow and shoulder nontender.   Skin:    General: Skin is warm and dry.  Neurological:     Mental Status: She is alert.     Sensory: No sensory deficit.     Deep Tendon Reflexes: Reflexes normal.     ED Results / Procedures / Treatments   Labs (all labs ordered are listed, but only abnormal results are displayed) Labs Reviewed - No data to display  EKG None  Radiology DG Forearm Left  Result Date: 04/05/2019 CLINICAL DATA:  86 year old female with history of trauma from a fall complaining of pain in the left forearm. EXAM: LEFT FOREARM - 2 VIEW COMPARISON:  04/05/2017.  FINDINGS: Acute fracture of the distal radial metaphyseal region with 5 mm of posterior displacement. 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 Soft tissue swelling around the distal aspect of the forearm and wrist. IMPRESSION: 1. Acute dorsally displaced fracture of the distal radial metaphyseal region. 2. 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 Electronically Signed  By: Vinnie Langton M.D.   On: 04/05/2019 19:32    Procedures Procedures (including critical care time)  SPLINT APPLICATION Date/Time: Q000111Q PM Authorized by: Evalee Jefferson Consent: Verbal consent obtained. Risks and benefits: risks, benefits and alternatives were discussed Consent given by: patient Splint applied by: RN Location details: left forearm Splint type: sugar tong Supplies used: webril, fiberglass, ace wraps Post-procedure: The splinted body part was neurovascularly unchanged following the procedure. Patient tolerance: Patient tolerated the procedure well with no immediate complications.     Medications Ordered in ED Medications - No data to display  ED Course  I have reviewed the triage vital signs and the nursing notes.  Pertinent labs & imaging results that were available during my care of the patient were reviewed by me and considered in my medical decision making (see chart for details).    MDM Rules/Calculators/A&P                      Imaging reviewed, interpreted and discussed with pt.  She was placed in a sugar tong splint, sling provided.  Ice, elevation, tylenol.  Given hydrocodone pre pack with caution given risk of sedation.  She was encouraged to call her orthopedist tomorrow (Dr. Mardelle Matte) for a close [place] for f/u care.   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

## 2019-04-05 NOTE — Discharge Instructions (Addendum)
Protect your arm at all times by leaving the splint in place.  Ice and elevation will help with pain and swelling.  Call Dr. Mardelle Matte for a recheck of your injury as soon as possible.  Call in the morning for an appointment.   You may take the pain medicine provided if your home tylenol is not effective. Use caution with this medicine as it will make you sleepy.

## 2019-04-05 NOTE — ED Triage Notes (Addendum)
Pt reports stubbed toe and fell forward on left wrsit/forearm. Pt reports history of break in that extremity in the past. Moderate swelling noted to left forearm. Distal pulses intact. Pt denies being on any blood thinners.  Removed three rings from left ring finger. Rings placed in patients inside left jacket pocket.

## 2019-04-05 NOTE — ED Notes (Signed)
Pt reports she went to let her pet in the door   "turned around, stumped my toe and fell in the floor" Had to call someone to pick her up  Reports she also broke her arm in the same area previously  Pain to midshaft of her L arm Ice application   Sensation intact

## 2019-04-06 DIAGNOSIS — S52502A Unspecified fracture of the lower end of left radius, initial encounter for closed fracture: Secondary | ICD-10-CM | POA: Diagnosis not present

## 2019-04-06 MED FILL — Hydrocodone-Acetaminophen Tab 5-325 MG: ORAL | Qty: 6 | Status: AC

## 2019-04-13 DIAGNOSIS — S52502A Unspecified fracture of the lower end of left radius, initial encounter for closed fracture: Secondary | ICD-10-CM | POA: Diagnosis not present

## 2019-04-20 DIAGNOSIS — S52502D Unspecified fracture of the lower end of left radius, subsequent encounter for closed fracture with routine healing: Secondary | ICD-10-CM | POA: Diagnosis not present

## 2019-04-21 ENCOUNTER — Other Ambulatory Visit: Payer: Self-pay

## 2019-04-21 ENCOUNTER — Encounter: Payer: Self-pay | Admitting: Family Medicine

## 2019-04-21 ENCOUNTER — Ambulatory Visit (INDEPENDENT_AMBULATORY_CARE_PROVIDER_SITE_OTHER): Payer: Medicare Other | Admitting: Family Medicine

## 2019-04-21 VITALS — BP 90/70 | HR 83 | Temp 98.3°F | Ht 67.0 in | Wt 134.0 lb

## 2019-04-21 DIAGNOSIS — E782 Mixed hyperlipidemia: Secondary | ICD-10-CM | POA: Diagnosis not present

## 2019-04-21 DIAGNOSIS — I10 Essential (primary) hypertension: Secondary | ICD-10-CM | POA: Diagnosis not present

## 2019-04-21 DIAGNOSIS — M810 Age-related osteoporosis without current pathological fracture: Secondary | ICD-10-CM

## 2019-04-21 DIAGNOSIS — R7309 Other abnormal glucose: Secondary | ICD-10-CM | POA: Insufficient documentation

## 2019-04-21 DIAGNOSIS — D649 Anemia, unspecified: Secondary | ICD-10-CM | POA: Diagnosis not present

## 2019-04-21 NOTE — Progress Notes (Signed)
New Patient Office Visit  Subjective:  Patient ID: Laura Martin, female    DOB: 1925-09-10  Age: 86 y.o. MRN: [id number]  CC:  Chief Complaint  Patient presents with  . Establish Care  . Wrist Injury    Left wrist Fracture  . Memory Loss    short term  IMPRESSION: xrays 1. Acute dorsally displaced fracture of the distal radial metaphyseal region. 2. 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   HPI [employer] presents for injury 2 weeks ago-pt in a wrist and lower arm-ov to re check this week. F/u in 4 weeks-no surgery HTN-amlodipine, pacemaker-sees cardiology-no recent labwork  Osteoporosis-Ca and Vit D-no medication , DEXA in 5/19 Past Medical History:  Diagnosis Date  . Complete heart block ([place])    a. s/p PPM 12/31/13:  Medtronic Adapta L (serial number [id number]) pacemaker  . Essential hypertension   . Goiter   . Mixed hyperlipidemia   . Spinal stenosis   . Thyroid nodule     Past Surgical History:  Procedure Laterality Date  . BIOPSY THYROID Left 11/2006  . BREAST BIOPSY Right 1990's   "calcium deposit"  . CATARACT EXTRACTION W/ INTRAOCULAR LENS  IMPLANT, BILATERAL Bilateral 2001  . DILATION AND CURETTAGE OF UTERUS     S/P miscarriage  . entropion  11/04/2017   right eye  . ESOPHAGEAL DILATION N/A 03/08/2015   Procedure: ESOPHAGEAL DILATION;  Surgeon: Rogene Houston, MD;  [facility];  Service: Endoscopy;  Laterality: N/A;  . ESOPHAGOGASTRODUODENOSCOPY N/A 03/08/2015   Procedure: ESOPHAGOGASTRODUODENOSCOPY (EGD);  Surgeon: Rogene Houston, MD;  Location: [facility];  Service: Endoscopy;  Laterality: N/A;  3:00  . INSERT / REPLACE / REMOVE PACEMAKER  12/30/2013  . KNEE ARTHROSCOPY Left 1986  . PERMANENT PACEMAKER INSERTION N/A 12/30/2013   Procedure: PERMANENT PACEMAKER INSERTION;  Surgeon: Evans Lance, MD;  Location: [facility] CATH LAB;  Service: Cardiovascular;   Laterality: N/A;  . TONSILLECTOMY  ~ 1943  . TOTAL KNEE ARTHROPLASTY Left 09/2010    Family History  Problem Relation Age of Onset  . Cancer Other   . Stroke Other     Social History   Socioeconomic History  . Marital status: Married    Spouse name: Not on file  . Number of children: Not on file  . Years of education: Not on file  . Highest education level: Not on file  Occupational History  . Not on file  Tobacco Use  . Smoking status: Never Smoker  . Smokeless tobacco: Never Used  Substance and Sexual Activity  . Alcohol use: No    Alcohol/week: 0.0 standard drinks  . Drug use: No  . Sexual activity: Not on file  Other Topics Concern  . Not on file  Social History Narrative  . Not on file   Social Determinants of Health   Financial Resource Strain:   . Difficulty of Paying Living Expenses: Not on file  Food Insecurity:   . Worried About Charity fundraiser in the Last Year: Not on file  . Ran Out of Food in the Last Year: Not on file  Transportation Needs:   . Lack of Transportation (Medical): Not on file  . Lack of Transportation (Non-Medical): Not on file  Physical Activity:   . Days of Exercise per Week: Not on file  . Minutes of Exercise per Session: Not on file  Stress:   .  Feeling of Stress : Not on file  Social Connections:   . Frequency of Communication with Friends and Family: Not on file  . Frequency of Social Gatherings with Friends and Family: Not on file  . Attends Religious Services: Not on file  . Active Member of Clubs or Organizations: Not on file  . Attends Archivist Meetings: Not on file  . Marital Status: Not on file  Intimate Partner Violence:   . Fear of Current or Ex-Partner: Not on file  . Emotionally Abused: Not on file  . Physically Abused: Not on file  . Sexually Abused: Not on file    ROS Review of Systems  Constitutional: Negative.   HENT: Positive for hearing loss.   Eyes:       Cataract surgery, glasses   Respiratory: Negative.   Cardiovascular:       Pacemaker  Gastrointestinal: Negative.   Endocrine: Negative.   Genitourinary: Negative.   Musculoskeletal: Positive for arthralgias.       Knee surgery-left, right knee-ortho-injections in the past  Skin: Negative.   Allergic/Immunologic: Negative.   Neurological: Negative.   Hematological: Negative.     Objective:   Today's Vitals: BP 90/70 (BP Location: Right Arm, Patient Position: Sitting, Cuff Size: Normal)   Pulse 83   Temp 98.3 F (36.8 C) (Temporal)   Ht 5\' 7"  (1.702 m)   Wt 134 lb (60.8 kg)   SpO2 96%   BMI 20.99 kg/m   Physical Exam Constitutional:      Appearance: Normal appearance.  HENT:     Head: Normocephalic and atraumatic.  Cardiovascular:     Rate and Rhythm: Normal rate and regular rhythm.     Pulses: Normal pulses.     Heart sounds: Normal heart sounds.  Pulmonary:     Effort: Pulmonary effort is normal.     Breath sounds: Normal breath sounds.  Musculoskeletal:     Cervical back: Normal range of motion and neck supple.  Neurological:     Mental Status: She is alert and oriented to person, place, and time.  Psychiatric:        Mood and Affect: Mood normal.        Behavior: Behavior normal.     Assessment & Plan:  1. Essential hypertension, benign Low bp today-Norvasc 10mg -cmp - Comprehensive Metabolic Panel (CMET) - TSH  2. Mixed hyperlipidemia cmp-omega 3 - Lipid panel  3. Elevated glucose - Hemoglobin A1c  4. Anemia, unspecified type - CBC w/Diff/Platelet  5. Osteoporosis, unspecified osteoporosis type, unspecified pathological fracture presence DEXA 5/21  Outpatient Encounter Medications as of 04/21/2019  Medication Sig  . acetaminophen (TYLENOL) 500 MG tablet Take 500 mg by mouth every 6 (six) hours as needed.  Marland Kitchen amLODipine (NORVASC) 10 MG tablet Take 10 mg by mouth daily.  . Calcium Carbonate (CALCIUM 600 PO) Take 600 mg by mouth daily. Takes 3 tabs daily  . cholecalciferol  (VITAMIN D) 400 UNITS TABS tablet Take 400 Units by mouth.  . Coenzyme Q10 (CO Q 10) 100 MG CAPS Take 100 mg by mouth daily.  . diphenhydrAMINE (BENADRYL) 25 MG tablet Take 25 mg by mouth at bedtime as needed for sleep.  . fish oil-omega-3 fatty acids 1000 MG capsule Take 1 g by mouth daily.  Marland Kitchen HYDROcodone-acetaminophen (NORCO/VICODIN) 5-325 MG tablet Take 2 tablets by mouth every 4 (four) hours as needed.  . [DISCONTINUED] naproxen sodium (ANAPROX) 220 MG tablet Take 220 mg by mouth at bedtime as needed.  No facility-administered encounter medications on file as of 04/21/2019.    Follow-up: virtual visit 2 weeks  Luella Gardenhire Hannah Beat, MD

## 2019-04-21 NOTE — Patient Instructions (Addendum)
Fasting labwork  Check blood pressure reading first thing in the morning and write it down   Bone Density 07/2019  COVID-19 Vaccine Information can be found at: [email or web address] For questions related to vaccine distribution or appointments, please email [email or web address] or call [phone].

## 2019-04-26 ENCOUNTER — Ambulatory Visit (INDEPENDENT_AMBULATORY_CARE_PROVIDER_SITE_OTHER): Payer: Medicare Other | Admitting: *Deleted

## 2019-04-26 DIAGNOSIS — E782 Mixed hyperlipidemia: Secondary | ICD-10-CM | POA: Diagnosis not present

## 2019-04-26 DIAGNOSIS — I442 Atrioventricular block, complete: Secondary | ICD-10-CM

## 2019-04-26 DIAGNOSIS — D649 Anemia, unspecified: Secondary | ICD-10-CM | POA: Diagnosis not present

## 2019-04-26 DIAGNOSIS — E559 Vitamin D deficiency, unspecified: Secondary | ICD-10-CM | POA: Diagnosis not present

## 2019-04-26 DIAGNOSIS — I10 Essential (primary) hypertension: Secondary | ICD-10-CM | POA: Diagnosis not present

## 2019-04-26 LAB — CUP PACEART REMOTE DEVICE CHECK
Battery Impedance: 375 Ohm
Battery Remaining Longevity: 94 mo
Battery Voltage: 2.8 V
Brady Statistic AP VP Percent: 19 %
Brady Statistic AP VS Percent: 0 %
Brady Statistic AS VP Percent: 77 %
Brady Statistic AS VS Percent: 3 %
Date Time Interrogation Session: 20210202140857
Implantable Lead Implant Date: 20151009
Implantable Lead Implant Date: 20151009
Implantable Lead Location: 753859
Implantable Lead Location: 753860
Implantable Lead Model: 5076
Implantable Lead Model: 5076
Implantable Pulse Generator Implant Date: 20151009
Lead Channel Impedance Value: 450 Ohm
Lead Channel Impedance Value: 555 Ohm
Lead Channel Pacing Threshold Amplitude: 0.375 V
Lead Channel Pacing Threshold Amplitude: 0.5 V
Lead Channel Pacing Threshold Pulse Width: 0.4 ms
Lead Channel Pacing Threshold Pulse Width: 0.4 ms
Lead Channel Setting Pacing Amplitude: 2 V
Lead Channel Setting Pacing Amplitude: 2.5 V
Lead Channel Setting Pacing Pulse Width: 0.4 ms
Lead Channel Setting Sensing Sensitivity: 2 mV

## 2019-04-27 LAB — COMPREHENSIVE METABOLIC PANEL
AG Ratio: 2.3 (calc) (ref 1.0–2.5)
ALT: 12 U/L (ref 6–29)
AST: 15 U/L (ref 10–35)
Albumin: 4.6 g/dL (ref 3.6–5.1)
Alkaline phosphatase (APISO): 75 U/L (ref 37–153)
BUN: 17 mg/dL (ref 7–25)
CO2: 31 mmol/L (ref 20–32)
Calcium: 9.9 mg/dL (ref 8.6–10.4)
Chloride: 104 mmol/L (ref 98–110)
Creat: 0.69 mg/dL (ref 0.60–0.88)
Globulin: 2 g/dL (calc) (ref 1.9–3.7)
Glucose, Bld: 108 mg/dL — ABNORMAL HIGH (ref 65–99)
Potassium: 4.1 mmol/L (ref 3.5–5.3)
Sodium: 140 mmol/L (ref 135–146)
Total Bilirubin: 1.3 mg/dL — ABNORMAL HIGH (ref 0.2–1.2)
Total Protein: 6.6 g/dL (ref 6.1–8.1)

## 2019-04-27 LAB — CBC WITH DIFFERENTIAL/PLATELET
Absolute Monocytes: 699 cells/uL (ref 200–950)
Basophils Absolute: 38 cells/uL (ref 0–200)
Basophils Relative: 0.6 %
Eosinophils Absolute: 252 cells/uL (ref 15–500)
Eosinophils Relative: 4 %
HCT: 38.5 % (ref 35.0–45.0)
Hemoglobin: 13.5 g/dL (ref 11.7–15.5)
Lymphs Abs: 1569 cells/uL (ref 850–3900)
MCH: 31.5 pg (ref 27.0–33.0)
MCHC: 35.1 g/dL (ref 32.0–36.0)
MCV: 90 fL (ref 80.0–100.0)
MPV: 10 fL (ref 7.5–12.5)
Monocytes Relative: 11.1 %
Neutro Abs: 3742 cells/uL (ref 1500–7800)
Neutrophils Relative %: 59.4 %
Platelets: 268 10*3/uL (ref 140–400)
RBC: 4.28 10*6/uL (ref 3.80–5.10)
RDW: 12.1 % (ref 11.0–15.0)
Total Lymphocyte: 24.9 %
WBC: 6.3 10*3/uL (ref 3.8–10.8)

## 2019-04-27 LAB — LIPID PANEL
Cholesterol: 202 mg/dL — ABNORMAL HIGH (ref <200)
HDL: 61 mg/dL (ref >=50)
LDL Cholesterol (Calc): 118 mg/dL (calc) — ABNORMAL HIGH
Non-HDL Cholesterol (Calc): 141 mg/dL (calc) — ABNORMAL HIGH (ref <130)
Total CHOL/HDL Ratio: 3.3 (calc) (ref <5.0)
Triglycerides: 119 mg/dL (ref <150)

## 2019-04-27 LAB — VITAMIN D 25 HYDROXY (VIT D DEFICIENCY, FRACTURES): Vit D, 25-Hydroxy: 28 ng/mL — ABNORMAL LOW (ref 30–100)

## 2019-04-27 LAB — TSH: TSH: 1.26 mIU/L (ref 0.40–4.50)

## 2019-04-27 NOTE — Progress Notes (Signed)
PPM Remote  

## 2019-05-05 ENCOUNTER — Encounter: Payer: Self-pay | Admitting: Family Medicine

## 2019-05-05 ENCOUNTER — Other Ambulatory Visit: Payer: Self-pay

## 2019-05-05 ENCOUNTER — Telehealth (INDEPENDENT_AMBULATORY_CARE_PROVIDER_SITE_OTHER): Payer: Medicare Other | Admitting: Family Medicine

## 2019-05-05 VITALS — BP 90/70 | Ht 67.0 in | Wt 134.0 lb

## 2019-05-05 DIAGNOSIS — R7309 Other abnormal glucose: Secondary | ICD-10-CM | POA: Diagnosis not present

## 2019-05-05 DIAGNOSIS — I10 Essential (primary) hypertension: Secondary | ICD-10-CM

## 2019-05-05 DIAGNOSIS — M810 Age-related osteoporosis without current pathological fracture: Secondary | ICD-10-CM

## 2019-05-05 NOTE — Progress Notes (Signed)
Virtual Visit via Telephone Note  I connected with Adams on 05/05/19 at  8:40 AM EST by telephone and verified that I am speaking with the correct person using two identifiers.DOB/address  Location: Patient: home Provider:office   I discussed the limitations, risks, security and privacy concerns of performing an evaluation and management service by telephone and the availability of in person appointments. I also discussed with the patient that there may be a patient responsible charge related to this service. The patient expressed understanding and agreed to proceed.   History of Present Illness: Pt with HTN-pt takes amlodipine variable am and pm "when I think about it"-no cp/headaches-bp readings at home with one systolic readings in the [id number] otherwise 140 or below.  Vit D3-28-pt takes 1000IU daily  Elevated glucose-108-no h/o DM  Observations/Objective: 142/74-7:15am pulse 75  Assessment and Plan:  1. Essential hypertension, benign Continue amlodipine-renal function normal, pt will continue home monitoring  2. Osteoporosis, unspecified osteoporosis type, unspecified pathological fracture presence DEXA 2021  3. Elevated glucose D/w pt decreasing carbohydrates -pt denies concentrated sweets, does not drink soda Follow Up Instructions: DEXA 5/21 Office visit in 6 months to recheck HTN   I discussed the assessment and treatment plan with the patient. The patient was provided an opportunity to ask questions and all were answered. The patient agreed with the plan and demonstrated an understanding of the instructions.   The patient was advised to call back or seek an in-person evaluation if the symptoms worsen or if the condition fails to improve as anticipated.  I provided 15 minutes of non-face-to-face time during this encounter.   Riccardo Holeman Hannah Beat, MD

## 2019-05-05 NOTE — Patient Instructions (Addendum)
COVID-19 Vaccine Information can be found at: [email or web address] For questions related to vaccine distribution or appointments, please email [email or web address] or call [phone].   Continue taking amlodipine Continue taking blood pressure readings daily Watch the amount of concentrated sweets in your diet-cookies, pies, cakes

## 2019-05-18 DIAGNOSIS — S52502D Unspecified fracture of the lower end of left radius, subsequent encounter for closed fracture with routine healing: Secondary | ICD-10-CM | POA: Diagnosis not present

## 2019-05-19 DIAGNOSIS — Z23 Encounter for immunization: Secondary | ICD-10-CM | POA: Diagnosis not present

## 2019-06-15 DIAGNOSIS — S52502D Unspecified fracture of the lower end of left radius, subsequent encounter for closed fracture with routine healing: Secondary | ICD-10-CM | POA: Diagnosis not present

## 2019-07-07 DIAGNOSIS — Z0189 Encounter for other specified special examinations: Secondary | ICD-10-CM | POA: Diagnosis not present

## 2019-07-07 DIAGNOSIS — I10 Essential (primary) hypertension: Secondary | ICD-10-CM | POA: Diagnosis not present

## 2019-07-07 DIAGNOSIS — M8580 Other specified disorders of bone density and structure, unspecified site: Secondary | ICD-10-CM | POA: Diagnosis not present

## 2019-07-07 DIAGNOSIS — E559 Vitamin D deficiency, unspecified: Secondary | ICD-10-CM | POA: Diagnosis not present

## 2019-07-07 DIAGNOSIS — E785 Hyperlipidemia, unspecified: Secondary | ICD-10-CM | POA: Diagnosis not present

## 2019-07-26 ENCOUNTER — Ambulatory Visit (INDEPENDENT_AMBULATORY_CARE_PROVIDER_SITE_OTHER): Payer: Medicare Other | Admitting: *Deleted

## 2019-07-26 DIAGNOSIS — I441 Atrioventricular block, second degree: Secondary | ICD-10-CM

## 2019-07-26 LAB — CUP PACEART REMOTE DEVICE CHECK
Battery Impedance: 375 Ohm
Battery Remaining Longevity: 93 mo
Battery Voltage: 2.8 V
Brady Statistic AP VP Percent: 20 %
Brady Statistic AP VS Percent: 0 %
Brady Statistic AS VP Percent: 77 %
Brady Statistic AS VS Percent: 3 %
Date Time Interrogation Session: 20210504082839
Implantable Lead Implant Date: 20151009
Implantable Lead Implant Date: 20151009
Implantable Lead Location: 753859
Implantable Lead Location: 753860
Implantable Lead Model: 5076
Implantable Lead Model: 5076
Implantable Pulse Generator Implant Date: 20151009
Lead Channel Impedance Value: 483 Ohm
Lead Channel Impedance Value: 510 Ohm
Lead Channel Pacing Threshold Amplitude: 0.375 V
Lead Channel Pacing Threshold Amplitude: 0.625 V
Lead Channel Pacing Threshold Pulse Width: 0.4 ms
Lead Channel Pacing Threshold Pulse Width: 0.4 ms
Lead Channel Setting Pacing Amplitude: 2 V
Lead Channel Setting Pacing Amplitude: 2.5 V
Lead Channel Setting Pacing Pulse Width: 0.4 ms
Lead Channel Setting Sensing Sensitivity: 2 mV

## 2019-07-27 DIAGNOSIS — S52502D Unspecified fracture of the lower end of left radius, subsequent encounter for closed fracture with routine healing: Secondary | ICD-10-CM | POA: Diagnosis not present

## 2019-07-27 NOTE — Progress Notes (Signed)
Remote pacemaker transmission.   

## 2019-08-17 DIAGNOSIS — I10 Essential (primary) hypertension: Secondary | ICD-10-CM | POA: Diagnosis not present

## 2019-08-17 DIAGNOSIS — E785 Hyperlipidemia, unspecified: Secondary | ICD-10-CM | POA: Diagnosis not present

## 2019-08-17 DIAGNOSIS — Z1321 Encounter for screening for nutritional disorder: Secondary | ICD-10-CM | POA: Diagnosis not present

## 2019-08-17 DIAGNOSIS — E559 Vitamin D deficiency, unspecified: Secondary | ICD-10-CM | POA: Diagnosis not present

## 2019-08-24 DIAGNOSIS — E539 Vitamin B deficiency, unspecified: Secondary | ICD-10-CM | POA: Diagnosis not present

## 2019-08-24 DIAGNOSIS — I10 Essential (primary) hypertension: Secondary | ICD-10-CM | POA: Diagnosis not present

## 2019-08-24 DIAGNOSIS — E785 Hyperlipidemia, unspecified: Secondary | ICD-10-CM | POA: Diagnosis not present

## 2019-08-24 DIAGNOSIS — E559 Vitamin D deficiency, unspecified: Secondary | ICD-10-CM | POA: Diagnosis not present

## 2019-08-24 DIAGNOSIS — M8580 Other specified disorders of bone density and structure, unspecified site: Secondary | ICD-10-CM | POA: Diagnosis not present

## 2019-08-25 ENCOUNTER — Other Ambulatory Visit (HOSPITAL_COMMUNITY): Payer: Self-pay | Admitting: Internal Medicine

## 2019-08-25 DIAGNOSIS — Z13820 Encounter for screening for osteoporosis: Secondary | ICD-10-CM

## 2019-10-07 ENCOUNTER — Other Ambulatory Visit: Payer: Self-pay

## 2019-10-07 ENCOUNTER — Ambulatory Visit
Admission: EM | Admit: 2019-10-07 | Discharge: 2019-10-07 | Disposition: A | Discharge status: Home / Self Care | Payer: Medicare Other | Attending: Emergency Medicine | Admitting: Emergency Medicine

## 2019-10-07 DIAGNOSIS — S81812A Laceration without foreign body, left lower leg, initial encounter: Secondary | ICD-10-CM | POA: Diagnosis not present

## 2019-10-07 DIAGNOSIS — L539 Erythematous condition, unspecified: Secondary | ICD-10-CM | POA: Diagnosis not present

## 2019-10-07 MED ORDER — CEPHALEXIN 500 MG PO CAPS: 500.0000 mg | ORAL_CAPSULE | Freq: Four times a day (QID) | ORAL | 0 refills | Status: DC

## 2019-10-07 NOTE — ED Provider Notes (Signed)
[facility]   [id number] 10/07/19 Arrival Time: [place]  Chief Complaint  Patient presents with  . Ankle Pain     SUBJECTIVE:  Laura Martin is a 86 y.o. female who presented to the urgent care with a complaint of skin tear to left ankle occur the past 2 days.  Reports she described her left ankle against the steps.  Bleeding controlled.  Currently not on blood thinners.  Denies similar symptoms in the past.  Denies fever, chills, nausea, vomiting, redness, swelling, purulent drainage, decrease strength or sensation.   Td UTD: Unknown, but patient refused Td immunization  ROS: As per HPI.  All other pertinent ROS negative.     Past Medical History:  Diagnosis Date  . Complete heart block ([place])    a. s/p PPM 12/31/13:  Medtronic Adapta L (serial number [id number]) pacemaker  . Essential hypertension   . Goiter   . Mixed hyperlipidemia   . Spinal stenosis   . Thyroid nodule    Past Surgical History:  Procedure Laterality Date  . BIOPSY THYROID Left 11/2006  . BREAST BIOPSY Right 1990's   "calcium deposit"  . CATARACT EXTRACTION W/ INTRAOCULAR LENS  IMPLANT, BILATERAL Bilateral 2001  . DILATION AND CURETTAGE OF UTERUS     S/P miscarriage  . entropion  11/04/2017   right eye  . ESOPHAGEAL DILATION N/A 03/08/2015   Procedure: ESOPHAGEAL DILATION;  Surgeon: Rogene Houston, MD;  [facility];  Service: Endoscopy;  Laterality: N/A;  . ESOPHAGOGASTRODUODENOSCOPY N/A 03/08/2015   Procedure: ESOPHAGOGASTRODUODENOSCOPY (EGD);  Surgeon: Rogene Houston, MD;  [facility];  Service: Endoscopy;  Laterality: N/A;  3:00  . INSERT / REPLACE / REMOVE PACEMAKER  12/30/2013  . KNEE ARTHROSCOPY Left 1986  . PERMANENT PACEMAKER INSERTION N/A 12/30/2013   Procedure: PERMANENT PACEMAKER INSERTION;  Surgeon: Evans Lance, MD;  Location: [facility] CATH LAB;  Service: Cardiovascular;  Laterality: N/A;  . TONSILLECTOMY  ~ 1943  . TOTAL KNEE ARTHROPLASTY Left 09/2010    Allergies  Allergen Reactions  . Codeine Nausea And Vomiting  . Tramadol Nausea And Vomiting  . Sulfa Antibiotics Rash   No current facility-administered medications on file prior to encounter.   Current Outpatient Medications on File Prior to Encounter  Medication Sig Dispense Refill  . acetaminophen (TYLENOL) 500 MG tablet Take 500 mg by mouth every 6 (six) hours as needed.    Marland Kitchen amLODipine (NORVASC) 10 MG tablet Take 10 mg by mouth daily.    . Calcium Carbonate (CALCIUM 600 PO) Take 600 mg by mouth daily. Takes 3 tabs daily    . cholecalciferol (VITAMIN D) 400 UNITS TABS tablet Take 400 Units by mouth.    . Coenzyme Q10 (CO Q 10) 100 MG CAPS Take 100 mg by mouth daily.    . diphenhydrAMINE (BENADRYL) 25 MG tablet Take 25 mg by mouth at bedtime as needed for sleep.    . fish oil-omega-3 fatty acids 1000 MG capsule Take 1 g by mouth daily.    Marland Kitchen HYDROcodone-acetaminophen (NORCO/VICODIN) 5-325 MG tablet Take 2 tablets by mouth every 4 (four) hours as needed. 6 tablet 0   Social History   Socioeconomic History  . Marital status: Married    Spouse name: Not on file  . Number of children: Not on file  . Years of education: Not on file  . Highest education level: Not on file  Occupational History  . Not on file  Tobacco Use  .  Smoking status: Never Smoker  . Smokeless tobacco: Never Used  Vaping Use  . Vaping Use: Never used  Substance and Sexual Activity  . Alcohol use: No    Alcohol/week: 0.0 standard drinks  . Drug use: No  . Sexual activity: Not on file  Other Topics Concern  . Not on file  Social History Narrative  . Not on file   Social Determinants of Health   Financial Resource Strain:   . Difficulty of Paying Living Expenses:   Food Insecurity:   . Worried About Charity fundraiser in the Last Year:   . Arboriculturist in the Last Year:   Transportation Needs:   . Film/video editor (Medical):   Marland Kitchen Lack of Transportation (Non-Medical):   Physical  Activity:   . Days of Exercise per Week:   . Minutes of Exercise per Session:   Stress:   . Feeling of Stress :   Social Connections:   . Frequency of Communication with Friends and Family:   . Frequency of Social Gatherings with Friends and Family:   . Attends Religious Services:   . Active Member of Clubs or Organizations:   . Attends Archivist Meetings:   Marland Kitchen Marital Status:   Intimate Partner Violence:   . Fear of Current or Ex-Partner:   . Emotionally Abused:   Marland Kitchen Physically Abused:   . Sexually Abused:    Family History  Problem Relation Age of Onset  . Cancer Other   . Stroke Other      OBJECTIVE:  Vitals:   10/07/19 0845  BP: (!) 166/75  Pulse: 76  Resp: 14  Temp: 98 F (36.7 C)  TempSrc: Oral  SpO2: 97%     Physical Exam Vitals and nursing note reviewed.  Constitutional:      General: She is not in acute distress.    Appearance: Normal appearance. She is normal weight. She is not ill-appearing, toxic-appearing or diaphoretic.  Cardiovascular:     Rate and Rhythm: Normal rate and regular rhythm.     Pulses: Normal pulses.     Heart sounds: Normal heart sounds. No murmur heard.  No friction rub. No gallop.   Pulmonary:     Effort: Pulmonary effort is normal. No respiratory distress.     Breath sounds: Normal breath sounds. No stridor. No wheezing, rhonchi or rales.  Chest:     Chest wall: No tenderness.  Skin:    General: Skin is warm.     Findings: Erythema and wound present.     Comments:  skin tear of left ankle; size: approx 4 cm.  Erythema and redness around the wound.  Neurological:     Mental Status: She is alert.      Results for orders placed or performed in visit on 07/26/19  CUP PACEART REMOTE DEVICE CHECK  Result Value Ref Range   Date Time Interrogation Session [id number]    Pulse Generator Manufacturer MERM    Pulse Gen Model ADDRL1 Adapta    Pulse Gen Serial Number [id number]    Clinic Name [facility] Pulse Generator Type Implantable Pulse Generator    Implantable Pulse Generator Implant Date 25852778    Implantable Lead Manufacturer [facility]    Implantable Lead Model 5076 CapSureFix Novus    Implantable Lead Serial Number [id number]    Implantable Lead Implant Date 24235361    Implantable Lead Location Detail 1 APPENDAGE    Implantable Lead Location  678938    Implantable Lead Manufacturer MERM    Implantable Lead Model 5076 CapSureFix Novus    Implantable Lead Serial Number [id number]    Implantable Lead Implant Date 10175102    Implantable Lead Location Detail 1 APEX    Implantable Lead Location [phone]    Lead Channel Setting Sensing Sensitivity 2.00 mV   Lead Channel Setting Pacing Amplitude 2.000 V   Lead Channel Setting Pacing Pulse Width 0.40 ms   Lead Channel Setting Pacing Amplitude 2.500 V   Lead Channel Impedance Value 483 ohm   Lead Channel Pacing Threshold Amplitude 0.375 V   Lead Channel Pacing Threshold Pulse Width 0.40 ms   Lead Channel Impedance Value 510 ohm   Lead Channel Pacing Threshold Amplitude 0.625 V   Lead Channel Pacing Threshold Pulse Width 0.40 ms   Battery Status OK    Battery Remaining Longevity 93 mo   Battery Voltage 2.80 V   Battery Impedance 375 ohm   Brady Statistic AP VP Percent 20 %   Brady Statistic AS VP Percent 77 %   Brady Statistic AP VS Percent 0 %   Brady Statistic AS VS Percent 3 %    Labs Reviewed - No data to display  No results found. .  ASSESSMENT & PLAN:  1. Skin tear of lower leg without complication, left, initial encounter   2. Erythema     Meds ordered this encounter  Medications  . cephALEXin (KEFLEX) 500 MG capsule    Sig: Take 1 capsule (500 mg total) by mouth 4 (four) times daily.    Dispense:  20 capsule    Refill:  0   Discharge instructions Bandage applied  Clean with warm water and mild soap.  Avoid submerging wound in water. Change dressing daily and apply a thin layer of neosporin.   Keflex was prescribed Take OTC ibuprofen or tylenol as needed for pain releif Return sooner or go to the ED if you have any new or worsening symptoms such as increased pain, redness, swelling, drainage, discharge, decreased range of motion of extremity, etc..     Reviewed expectations re: course of current medical issues. Questions answered. Outlined signs and symptoms indicating need for more acute intervention. Patient verbalized understanding. After Visit Summary given.     Note: This document was prepared using Dragon voice recognition software and may include unintentional dictation errors.     Emerson Monte, [place] 10/07/19 [phone]

## 2019-10-07 NOTE — Discharge Instructions (Addendum)
Bandage applied  Clean with warm water and mild soap.  Avoid submerging wound in water. Change dressing daily and apply a thin layer of neosporin.  Keflex was prescribed Take OTC ibuprofen or tylenol as needed for pain releif Return sooner or go to the ED if you have any new or worsening symptoms such as increased pain, redness, swelling, drainage, discharge, decreased range of motion of extremity, etc..

## 2019-10-07 NOTE — ED Triage Notes (Signed)
On Wednesday patient lost footing going down steps and scraped left ankle. Denies hitting head. Denies loss of consciousness. Patient states there is increasing redness and pain around the wound on the ankle.

## 2019-10-25 ENCOUNTER — Ambulatory Visit (INDEPENDENT_AMBULATORY_CARE_PROVIDER_SITE_OTHER): Payer: Medicare Other | Admitting: *Deleted

## 2019-10-25 DIAGNOSIS — I442 Atrioventricular block, complete: Secondary | ICD-10-CM

## 2019-10-26 LAB — CUP PACEART REMOTE DEVICE CHECK
Battery Impedance: 400 Ohm
Battery Remaining Longevity: 92 mo
Battery Voltage: 2.8 V
Brady Statistic AP VP Percent: 22 %
Brady Statistic AP VS Percent: 0 %
Brady Statistic AS VP Percent: 76 %
Brady Statistic AS VS Percent: 2 %
Date Time Interrogation Session: 20210804130415
Implantable Lead Implant Date: 20151009
Implantable Lead Implant Date: 20151009
Implantable Lead Location: 753859
Implantable Lead Location: 753860
Implantable Lead Model: 5076
Implantable Lead Model: 5076
Implantable Pulse Generator Implant Date: 20151009
Lead Channel Impedance Value: 470 Ohm
Lead Channel Impedance Value: 580 Ohm
Lead Channel Pacing Threshold Amplitude: 0.375 V
Lead Channel Pacing Threshold Amplitude: 0.625 V
Lead Channel Pacing Threshold Pulse Width: 0.4 ms
Lead Channel Pacing Threshold Pulse Width: 0.4 ms
Lead Channel Setting Pacing Amplitude: 2 V
Lead Channel Setting Pacing Amplitude: 2.5 V
Lead Channel Setting Pacing Pulse Width: 0.4 ms
Lead Channel Setting Sensing Sensitivity: 2 mV

## 2019-10-27 NOTE — Progress Notes (Signed)
Remote pacemaker transmission.   

## 2019-11-02 ENCOUNTER — Other Ambulatory Visit: Payer: Self-pay

## 2019-11-02 ENCOUNTER — Encounter: Payer: Self-pay | Admitting: Emergency Medicine

## 2019-11-02 ENCOUNTER — Ambulatory Visit
Admission: EM | Admit: 2019-11-02 | Discharge: 2019-11-02 | Disposition: A | Discharge status: Home / Self Care | Payer: Medicare Other | Attending: Emergency Medicine | Admitting: Emergency Medicine

## 2019-11-02 DIAGNOSIS — L089 Local infection of the skin and subcutaneous tissue, unspecified: Secondary | ICD-10-CM | POA: Diagnosis not present

## 2019-11-02 DIAGNOSIS — T148XXA Other injury of unspecified body region, initial encounter: Secondary | ICD-10-CM | POA: Diagnosis not present

## 2019-11-02 DIAGNOSIS — S91002A Unspecified open wound, left ankle, initial encounter: Secondary | ICD-10-CM

## 2019-11-02 MED ORDER — DOXYCYCLINE HYCLATE 100 MG PO CAPS: 100.0000 mg | ORAL_CAPSULE | Freq: Two times a day (BID) | ORAL | 0 refills | Status: DC

## 2019-11-02 NOTE — ED Triage Notes (Signed)
Pt hit LT foot 07/16, has wound on LT ankle that is swollen and painful.

## 2019-11-02 NOTE — ED Provider Notes (Signed)
[place]   [id number] 11/02/19 Arrival Time: 33   CC: wound check  SUBJECTIVE:  Laura Martin is a 86 y.o. female who presents with a wound check to left ankle x 1 month.  Initially injury occurred on 7/16 after hitting LT foot/ ankle.  Was seen in UC at that time and treated with keflex without relief.  Complains of associated swelling and erythema.  Also reports drainage.  Denies fever, chills, nausea, vomiting, pain with ambulation.    ROS: As per HPI.  All other pertinent ROS negative.     Past Medical History:  Diagnosis Date  . Complete heart block ([place])    a. s/p PPM 12/31/13:  Medtronic Adapta L (serial number [id number]) pacemaker  . Essential hypertension   . Goiter   . Mixed hyperlipidemia   . Spinal stenosis   . Thyroid nodule    Past Surgical History:  Procedure Laterality Date  . BIOPSY THYROID Left 11/2006  . BREAST BIOPSY Right 1990's   "calcium deposit"  . CATARACT EXTRACTION W/ INTRAOCULAR LENS  IMPLANT, BILATERAL Bilateral 2001  . DILATION AND CURETTAGE OF UTERUS     S/P miscarriage  . entropion  11/04/2017   right eye  . ESOPHAGEAL DILATION N/A 03/08/2015   Procedure: ESOPHAGEAL DILATION;  Surgeon: Rogene Houston, MD;  [facility];  Service: Endoscopy;  Laterality: N/A;  . ESOPHAGOGASTRODUODENOSCOPY N/A 03/08/2015   Procedure: ESOPHAGOGASTRODUODENOSCOPY (EGD);  Surgeon: Rogene Houston, MD;  Location: [facility];  Service: Endoscopy;  Laterality: N/A;  3:00  . INSERT / REPLACE / REMOVE PACEMAKER  12/30/2013  . KNEE ARTHROSCOPY Left 1986  . PERMANENT PACEMAKER INSERTION N/A 12/30/2013   Procedure: PERMANENT PACEMAKER INSERTION;  Surgeon: Evans Lance, MD;  Location: [facility] CATH LAB;  Service: Cardiovascular;  Laterality: N/A;  . TONSILLECTOMY  ~ 1943  . TOTAL KNEE ARTHROPLASTY Left 09/2010   Allergies  Allergen Reactions  . Codeine Nausea And Vomiting  . Tramadol Nausea And Vomiting  . Sulfa Antibiotics Rash   No  current facility-administered medications on file prior to encounter.   Current Outpatient Medications on File Prior to Encounter  Medication Sig Dispense Refill  . acetaminophen (TYLENOL) 500 MG tablet Take 500 mg by mouth every 6 (six) hours as needed.    Marland Kitchen amLODipine (NORVASC) 10 MG tablet Take 10 mg by mouth daily.    . Calcium Carbonate (CALCIUM 600 PO) Take 600 mg by mouth daily. Takes 3 tabs daily    . cholecalciferol (VITAMIN D) 400 UNITS TABS tablet Take 400 Units by mouth.    . Coenzyme Q10 (CO Q 10) 100 MG CAPS Take 100 mg by mouth daily.    . diphenhydrAMINE (BENADRYL) 25 MG tablet Take 25 mg by mouth at bedtime as needed for sleep.    . fish oil-omega-3 fatty acids 1000 MG capsule Take 1 g by mouth daily.    Marland Kitchen HYDROcodone-acetaminophen (NORCO/VICODIN) 5-325 MG tablet Take 2 tablets by mouth every 4 (four) hours as needed. 6 tablet 0   Social History   Socioeconomic History  . Marital status: Married    Spouse name: Not on file  . Number of children: Not on file  . Years of education: Not on file  . Highest education level: Not on file  Occupational History  . Not on file  Tobacco Use  . Smoking status: Never Smoker  . Smokeless tobacco: Never Used  Vaping Use  . Vaping Use: Never used  Substance and Sexual Activity  . Alcohol use: No    Alcohol/week: 0.0 standard drinks  . Drug use: No  . Sexual activity: Not on file  Other Topics Concern  . Not on file  Social History Narrative  . Not on file   Social Determinants of Health   Financial Resource Strain:   . Difficulty of Paying Living Expenses:   Food Insecurity:   . Worried About Charity fundraiser in the Last Year:   . Arboriculturist in the Last Year:   Transportation Needs:   . Film/video editor (Medical):   Marland Kitchen Lack of Transportation (Non-Medical):   Physical Activity:   . Days of Exercise per Week:   . Minutes of Exercise per Session:   Stress:   . Feeling of Stress :   Social Connections:     . Frequency of Communication with Friends and Family:   . Frequency of Social Gatherings with Friends and Family:   . Attends Religious Services:   . Active Member of Clubs or Organizations:   . Attends Archivist Meetings:   Marland Kitchen Marital Status:   Intimate Partner Violence:   . Fear of Current or Ex-Partner:   . Emotionally Abused:   Marland Kitchen Physically Abused:   . Sexually Abused:    Family History  Problem Relation Age of Onset  . Cancer Other   . Stroke Other     OBJECTIVE:  Vitals:   11/02/19 1555 11/02/19 1556  BP:  (!) 182/79  Pulse:  65  Resp:  18  Temp:  98.2 F (36.8 C)  TempSrc:  Oral  SpO2:  95%  Weight: 132 lb 4.4 oz (60 kg)   Height: 5\' 7"  (1.702 m)      General appearance: alert; no distress CV: Dorsalis pedis pulse 2+ Skin: 1 cm wound with yellow drainage to LT lateral ankle, TTP with surrounding erythema and swelling Psychological: alert and cooperative; normal mood and affect   ASSESSMENT & PLAN:  1. Infected wound   2. Wound of left ankle, initial encounter     Meds ordered this encounter  Medications  . doxycycline (VIBRAMYCIN) 100 MG capsule    Sig: Take 1 capsule (100 mg total) by mouth 2 (two) times daily.    Dispense:  20 capsule    Refill:  0    Order Specific Question:   Supervising Provider    Answer:   Raylene Everts [[id number]]    Wash site daily with warm water and mild soap Keep covered to avoid friction Take antibiotic as prescribed and to completion Follow up wound management Return or go to the ED if you have any new or worsening symptoms increased redness, swelling, pain, nausea, vomiting, fever, chills, etc...    Reviewed expectations re: course of current medical issues. Questions answered. Outlined signs and symptoms indicating need for more acute intervention. Patient verbalized understanding. After Visit Summary given.          Lestine Box, PA-C 11/02/19 1609

## 2019-11-02 NOTE — Discharge Instructions (Signed)
Wash site daily with warm water and mild soap Keep covered to avoid friction Take antibiotic as prescribed and to completion Follow up wound management Return or go to the ED if you have any new or worsening symptoms increased redness, swelling, pain, nausea, vomiting, fever, chills, etc..Marland Kitchen

## 2019-11-05 ENCOUNTER — Encounter (HOSPITAL_COMMUNITY)
Admission: EM | Disposition: A | Discharge status: Home / Self Care | Payer: Self-pay | Source: Home / Self Care | Attending: General Surgery

## 2019-11-05 ENCOUNTER — Inpatient Hospital Stay (HOSPITAL_COMMUNITY): Payer: Medicare Other | Admitting: Anesthesiology

## 2019-11-05 ENCOUNTER — Encounter (HOSPITAL_COMMUNITY): Payer: Self-pay

## 2019-11-05 ENCOUNTER — Inpatient Hospital Stay (HOSPITAL_COMMUNITY)
Admission: EM | Admit: 2019-11-05 | Discharge: 2019-11-09 | DRG: 329 | Disposition: A | Discharge status: Home / Self Care | Payer: Medicare Other | Attending: General Surgery | Admitting: General Surgery

## 2019-11-05 ENCOUNTER — Emergency Department (HOSPITAL_COMMUNITY): Payer: Medicare Other

## 2019-11-05 ENCOUNTER — Other Ambulatory Visit: Payer: Self-pay

## 2019-11-05 DIAGNOSIS — Z823 Family history of stroke: Secondary | ICD-10-CM

## 2019-11-05 DIAGNOSIS — Z950 Presence of cardiac pacemaker: Secondary | ICD-10-CM

## 2019-11-05 DIAGNOSIS — K55029 Acute infarction of small intestine, extent unspecified: Secondary | ICD-10-CM | POA: Diagnosis present

## 2019-11-05 DIAGNOSIS — Z96652 Presence of left artificial knee joint: Secondary | ICD-10-CM | POA: Diagnosis present

## 2019-11-05 DIAGNOSIS — I10 Essential (primary) hypertension: Secondary | ICD-10-CM | POA: Diagnosis present

## 2019-11-05 DIAGNOSIS — I442 Atrioventricular block, complete: Secondary | ICD-10-CM | POA: Diagnosis not present

## 2019-11-05 DIAGNOSIS — E782 Mixed hyperlipidemia: Secondary | ICD-10-CM | POA: Diagnosis not present

## 2019-11-05 DIAGNOSIS — Z20822 Contact with and (suspected) exposure to covid-19: Secondary | ICD-10-CM | POA: Diagnosis not present

## 2019-11-05 DIAGNOSIS — Z9841 Cataract extraction status, right eye: Secondary | ICD-10-CM | POA: Diagnosis not present

## 2019-11-05 DIAGNOSIS — R739 Hyperglycemia, unspecified: Secondary | ICD-10-CM | POA: Diagnosis present

## 2019-11-05 DIAGNOSIS — K5652 Intestinal adhesions [bands] with complete obstruction: Secondary | ICD-10-CM | POA: Diagnosis not present

## 2019-11-05 DIAGNOSIS — Z01818 Encounter for other preprocedural examination: Secondary | ICD-10-CM | POA: Diagnosis not present

## 2019-11-05 DIAGNOSIS — K56609 Unspecified intestinal obstruction, unspecified as to partial versus complete obstruction: Secondary | ICD-10-CM | POA: Diagnosis not present

## 2019-11-05 DIAGNOSIS — K5730 Diverticulosis of large intestine without perforation or abscess without bleeding: Secondary | ICD-10-CM | POA: Diagnosis not present

## 2019-11-05 DIAGNOSIS — D649 Anemia, unspecified: Secondary | ICD-10-CM | POA: Diagnosis not present

## 2019-11-05 DIAGNOSIS — Z885 Allergy status to narcotic agent status: Secondary | ICD-10-CM

## 2019-11-05 DIAGNOSIS — Z9842 Cataract extraction status, left eye: Secondary | ICD-10-CM

## 2019-11-05 DIAGNOSIS — Z882 Allergy status to sulfonamides status: Secondary | ICD-10-CM | POA: Diagnosis not present

## 2019-11-05 DIAGNOSIS — Z961 Presence of intraocular lens: Secondary | ICD-10-CM | POA: Diagnosis not present

## 2019-11-05 DIAGNOSIS — Z789 Other specified health status: Secondary | ICD-10-CM

## 2019-11-05 DIAGNOSIS — Z79899 Other long term (current) drug therapy: Secondary | ICD-10-CM | POA: Diagnosis not present

## 2019-11-05 DIAGNOSIS — K922 Gastrointestinal hemorrhage, unspecified: Secondary | ICD-10-CM | POA: Diagnosis not present

## 2019-11-05 DIAGNOSIS — K56699 Other intestinal obstruction unspecified as to partial versus complete obstruction: Secondary | ICD-10-CM | POA: Diagnosis not present

## 2019-11-05 DIAGNOSIS — K6389 Other specified diseases of intestine: Secondary | ICD-10-CM | POA: Diagnosis not present

## 2019-11-05 DIAGNOSIS — R1010 Upper abdominal pain, unspecified: Secondary | ICD-10-CM

## 2019-11-05 DIAGNOSIS — K8020 Calculus of gallbladder without cholecystitis without obstruction: Secondary | ICD-10-CM | POA: Diagnosis not present

## 2019-11-05 HISTORY — PX: SMALL BOWEL REPAIR: SHX6447

## 2019-11-05 LAB — CBC WITH DIFFERENTIAL/PLATELET
Abs Immature Granulocytes: 0.1 10*3/uL — ABNORMAL HIGH (ref 0.00–0.07)
Basophils Absolute: 0 10*3/uL (ref 0.0–0.1)
Basophils Relative: 0 %
Eosinophils Absolute: 0 10*3/uL (ref 0.0–0.5)
Eosinophils Relative: 0 %
HCT: 41.5 % (ref 36.0–46.0)
Hemoglobin: 14.4 g/dL (ref 12.0–15.0)
Immature Granulocytes: 1 %
Lymphocytes Relative: 6 %
Lymphs Abs: 1.2 10*3/uL (ref 0.7–4.0)
MCH: 31.6 pg (ref 26.0–34.0)
MCHC: 34.7 g/dL (ref 30.0–36.0)
MCV: 91.2 fL (ref 80.0–100.0)
Monocytes Absolute: 2.2 10*3/uL — ABNORMAL HIGH (ref 0.1–1.0)
Monocytes Relative: 10 %
Neutro Abs: 17.4 10*3/uL — ABNORMAL HIGH (ref 1.7–7.7)
Neutrophils Relative %: 83 %
Platelets: 268 10*3/uL (ref 150–400)
RBC: 4.55 MIL/uL (ref 3.87–5.11)
RDW: 13.6 % (ref 11.5–15.5)
WBC: 20.9 10*3/uL — ABNORMAL HIGH (ref 4.0–10.5)
nRBC: 0 % (ref 0.0–0.2)

## 2019-11-05 LAB — COMPREHENSIVE METABOLIC PANEL
ALT: 12 U/L (ref 0–44)
AST: 18 U/L (ref 15–41)
Albumin: 3.9 g/dL (ref 3.5–5.0)
Alkaline Phosphatase: 39 U/L (ref 38–126)
Anion gap: 12 (ref 5–15)
BUN: 53 mg/dL — ABNORMAL HIGH (ref 8–23)
CO2: 26 mmol/L (ref 22–32)
Calcium: 9.4 mg/dL (ref 8.9–10.3)
Chloride: 97 mmol/L — ABNORMAL LOW (ref 98–111)
Creatinine, Ser: 1.19 mg/dL — ABNORMAL HIGH (ref 0.44–1.00)
GFR calc Af Amer: 46 mL/min — ABNORMAL LOW (ref >60)
GFR calc non Af Amer: 40 mL/min — ABNORMAL LOW (ref >60)
Glucose, Bld: 181 mg/dL — ABNORMAL HIGH (ref 70–99)
Potassium: 4 mmol/L (ref 3.5–5.1)
Sodium: 135 mmol/L (ref 135–145)
Total Bilirubin: 2.4 mg/dL — ABNORMAL HIGH (ref 0.3–1.2)
Total Protein: 6.6 g/dL (ref 6.5–8.1)

## 2019-11-05 LAB — URINALYSIS, ROUTINE W REFLEX MICROSCOPIC
Bilirubin Urine: NEGATIVE
Glucose, UA: NEGATIVE mg/dL
Hgb urine dipstick: NEGATIVE
Ketones, ur: NEGATIVE mg/dL
Leukocytes,Ua: NEGATIVE
Nitrite: NEGATIVE
Protein, ur: NEGATIVE mg/dL
Specific Gravity, Urine: 1.038 — ABNORMAL HIGH (ref 1.005–1.030)
pH: 5 (ref 5.0–8.0)

## 2019-11-05 LAB — SARS CORONAVIRUS 2 BY RT PCR (HOSPITAL ORDER, PERFORMED IN ~~LOC~~ HOSPITAL LAB): SARS Coronavirus 2: NEGATIVE

## 2019-11-05 SURGERY — APPENDECTOMY, LAPAROSCOPIC
Anesthesia: General

## 2019-11-05 SURGERY — REPAIR, SMALL INTESTINE
Anesthesia: General | Site: Abdomen

## 2019-11-05 MED ORDER — POVIDONE-IODINE 10 % OINT PACKET
TOPICAL_OINTMENT | CUTANEOUS | Status: DC | PRN
  Administered 2019-11-05: 1 via TOPICAL

## 2019-11-05 MED ORDER — ENOXAPARIN SODIUM 30 MG/0.3ML ~~LOC~~ SOLN
30.0000 mg | SUBCUTANEOUS | Status: DC
  Administered 2019-11-06 – 2019-11-08 (×3): 30 mg via SUBCUTANEOUS
  Filled 2019-11-05 (×3): qty 0.3

## 2019-11-05 MED ORDER — ACETAMINOPHEN 325 MG PO TABS: 650.0000 mg | ORAL_TABLET | Freq: Four times a day (QID) | ORAL | Status: DC | PRN

## 2019-11-05 MED ORDER — HEPARIN SODIUM (PORCINE) 5000 UNIT/ML IJ SOLN: 5000.0000 [IU] | Freq: Three times a day (TID) | INTRAMUSCULAR | Status: DC

## 2019-11-05 MED ORDER — ACETAMINOPHEN 650 MG RE SUPP: 650.0000 mg | Freq: Four times a day (QID) | RECTAL | Status: DC | PRN

## 2019-11-05 MED ORDER — MORPHINE SULFATE (PF) 2 MG/ML IV SOLN
1.0000 mg | INTRAVENOUS | Status: DC | PRN
  Administered 2019-11-05 – 2019-11-06 (×2): 1 mg via INTRAVENOUS
  Filled 2019-11-05 (×2): qty 1

## 2019-11-05 MED ORDER — CHLORHEXIDINE GLUCONATE CLOTH 2 % EX PADS
6.0000 | MEDICATED_PAD | Freq: Every day | CUTANEOUS | Status: DC
  Administered 2019-11-06 – 2019-11-09 (×4): 6 via TOPICAL

## 2019-11-05 MED ORDER — LACTATED RINGERS IV SOLN: INTRAVENOUS | Status: DC | PRN

## 2019-11-05 MED ORDER — SODIUM CHLORIDE 0.9% FLUSH
10.0000 mL | Freq: Two times a day (BID) | INTRAVENOUS | Status: DC
  Administered 2019-11-05 – 2019-11-08 (×5): 10 mL
  Administered 2019-11-09: 20 mL
  Administered 2019-11-09: 10 mL

## 2019-11-05 MED ORDER — FENTANYL CITRATE (PF) 100 MCG/2ML IJ SOLN: 25.0000 ug | INTRAMUSCULAR | Status: DC | PRN

## 2019-11-05 MED ORDER — BUPIVACAINE LIPOSOME 1.3 % IJ SUSP
INTRAMUSCULAR | Status: AC
  Filled 2019-11-05: qty 20

## 2019-11-05 MED ORDER — SUCCINYLCHOLINE CHLORIDE 20 MG/ML IJ SOLN
INTRAMUSCULAR | Status: DC | PRN
  Administered 2019-11-05: 100 mg via INTRAVENOUS

## 2019-11-05 MED ORDER — ONDANSETRON HCL 4 MG/2ML IJ SOLN
4.0000 mg | Freq: Once | INTRAMUSCULAR | Status: AC
  Administered 2019-11-05: 4 mg via INTRAVENOUS
  Filled 2019-11-05: qty 2

## 2019-11-05 MED ORDER — FENTANYL CITRATE (PF) 100 MCG/2ML IJ SOLN
25.0000 ug | INTRAMUSCULAR | Status: DC | PRN
  Administered 2019-11-06: 25 ug via INTRAVENOUS
  Filled 2019-11-05: qty 2

## 2019-11-05 MED ORDER — SUCCINYLCHOLINE CHLORIDE 200 MG/10ML IV SOSY
PREFILLED_SYRINGE | INTRAVENOUS | Status: AC
  Filled 2019-11-05: qty 10

## 2019-11-05 MED ORDER — SODIUM CHLORIDE 0.9 % IV BOLUS
500.0000 mL | Freq: Once | INTRAVENOUS | Status: AC
  Administered 2019-11-05: 500 mL via INTRAVENOUS

## 2019-11-05 MED ORDER — PIPERACILLIN-TAZOBACTAM 3.375 G IVPB
3.3750 g | Freq: Three times a day (TID) | INTRAVENOUS | Status: DC
  Administered 2019-11-05 – 2019-11-09 (×10): 3.375 g via INTRAVENOUS
  Filled 2019-11-05 (×10): qty 50

## 2019-11-05 MED ORDER — ONDANSETRON HCL 4 MG/2ML IJ SOLN: 4.0000 mg | Freq: Four times a day (QID) | INTRAMUSCULAR | Status: DC | PRN

## 2019-11-05 MED ORDER — GLYCOPYRROLATE PF 0.2 MG/ML IJ SOSY
PREFILLED_SYRINGE | INTRAMUSCULAR | Status: AC
  Filled 2019-11-05: qty 1

## 2019-11-05 MED ORDER — NEOSTIGMINE METHYLSULFATE 10 MG/10ML IV SOLN
INTRAVENOUS | Status: DC | PRN
  Administered 2019-11-05: 3 mg via INTRAVENOUS

## 2019-11-05 MED ORDER — AMLODIPINE BESYLATE 5 MG PO TABS
5.0000 mg | ORAL_TABLET | Freq: Every day | ORAL | Status: DC
  Administered 2019-11-05 – 2019-11-09 (×5): 5 mg via ORAL
  Filled 2019-11-05 (×5): qty 1

## 2019-11-05 MED ORDER — CHLORHEXIDINE GLUCONATE CLOTH 2 % EX PADS: 6.0000 | MEDICATED_PAD | Freq: Once | CUTANEOUS | Status: DC

## 2019-11-05 MED ORDER — ROCURONIUM BROMIDE 10 MG/ML (PF) SYRINGE
PREFILLED_SYRINGE | INTRAVENOUS | Status: AC
  Filled 2019-11-05: qty 10

## 2019-11-05 MED ORDER — FENTANYL CITRATE (PF) 100 MCG/2ML IJ SOLN
INTRAMUSCULAR | Status: DC | PRN
  Administered 2019-11-05 (×2): 50 ug via INTRAVENOUS

## 2019-11-05 MED ORDER — POVIDONE-IODINE 10 % EX OINT
TOPICAL_OINTMENT | CUTANEOUS | Status: AC
  Filled 2019-11-05: qty 1

## 2019-11-05 MED ORDER — PROPOFOL 10 MG/ML IV BOLUS
INTRAVENOUS | Status: AC
  Filled 2019-11-05: qty 20

## 2019-11-05 MED ORDER — NEOSTIGMINE METHYLSULFATE 3 MG/3ML IV SOSY
PREFILLED_SYRINGE | INTRAVENOUS | Status: AC
  Filled 2019-11-05: qty 3

## 2019-11-05 MED ORDER — FENTANYL CITRATE (PF) 100 MCG/2ML IJ SOLN
INTRAMUSCULAR | Status: AC
  Filled 2019-11-05: qty 2

## 2019-11-05 MED ORDER — SODIUM CHLORIDE 0.9 % IV SOLN: INTRAVENOUS | Status: DC

## 2019-11-05 MED ORDER — ONDANSETRON HCL 4 MG/2ML IJ SOLN
4.0000 mg | Freq: Four times a day (QID) | INTRAMUSCULAR | Status: DC | PRN
  Administered 2019-11-07: 4 mg via INTRAVENOUS
  Filled 2019-11-05: qty 2

## 2019-11-05 MED ORDER — DIPHENHYDRAMINE HCL 12.5 MG/5ML PO ELIX
12.5000 mg | ORAL_SOLUTION | Freq: Four times a day (QID) | ORAL | Status: DC | PRN
  Administered 2019-11-05: 12.5 mg via ORAL
  Filled 2019-11-05 (×3): qty 5

## 2019-11-05 MED ORDER — PIPERACILLIN-TAZOBACTAM 3.375 G IVPB 30 MIN
3.3750 g | Freq: Once | INTRAVENOUS | Status: AC
  Administered 2019-11-05: 3.375 g via INTRAVENOUS
  Filled 2019-11-05: qty 50

## 2019-11-05 MED ORDER — ONDANSETRON HCL 4 MG/2ML IJ SOLN
INTRAMUSCULAR | Status: AC
  Filled 2019-11-05: qty 2

## 2019-11-05 MED ORDER — DIPHENHYDRAMINE HCL 50 MG/ML IJ SOLN
12.5000 mg | Freq: Four times a day (QID) | INTRAMUSCULAR | Status: DC | PRN
  Administered 2019-11-06: 12.5 mg via INTRAVENOUS
  Filled 2019-11-05: qty 1

## 2019-11-05 MED ORDER — ONDANSETRON 4 MG PO TBDP: 4.0000 mg | ORAL_TABLET | Freq: Four times a day (QID) | ORAL | Status: DC | PRN

## 2019-11-05 MED ORDER — PHENYLEPHRINE HCL (PRESSORS) 10 MG/ML IV SOLN
INTRAVENOUS | Status: DC | PRN
  Administered 2019-11-05: 40 ug via INTRAVENOUS

## 2019-11-05 MED ORDER — ONDANSETRON HCL 4 MG PO TABS
4.0000 mg | ORAL_TABLET | Freq: Four times a day (QID) | ORAL | Status: DC | PRN
  Administered 2019-11-08: 4 mg via ORAL
  Filled 2019-11-05: qty 1

## 2019-11-05 MED ORDER — BUPIVACAINE LIPOSOME 1.3 % IJ SUSP
INTRAMUSCULAR | Status: DC | PRN
  Administered 2019-11-05: 15 mL

## 2019-11-05 MED ORDER — PANTOPRAZOLE SODIUM 40 MG PO TBEC
40.0000 mg | DELAYED_RELEASE_TABLET | Freq: Every day | ORAL | Status: DC
  Administered 2019-11-06 – 2019-11-08 (×3): 40 mg via ORAL
  Filled 2019-11-05 (×3): qty 1

## 2019-11-05 MED ORDER — GLYCOPYRROLATE 0.2 MG/ML IJ SOLN
INTRAMUSCULAR | Status: DC | PRN
  Administered 2019-11-05: .2 mg via INTRAVENOUS

## 2019-11-05 MED ORDER — IOHEXOL 300 MG/ML  SOLN
75.0000 mL | Freq: Once | INTRAMUSCULAR | Status: AC | PRN
  Administered 2019-11-05: 75 mL via INTRAVENOUS

## 2019-11-05 MED ORDER — SODIUM CHLORIDE 0.9% FLUSH: 10.0000 mL | INTRAVENOUS | Status: DC | PRN

## 2019-11-05 MED ORDER — SIMETHICONE 80 MG PO CHEW: 40.0000 mg | CHEWABLE_TABLET | Freq: Four times a day (QID) | ORAL | Status: DC | PRN

## 2019-11-05 MED ORDER — POTASSIUM CHLORIDE IN NACL 20-0.9 MEQ/L-% IV SOLN: INTRAVENOUS | Status: DC

## 2019-11-05 MED ORDER — ONDANSETRON HCL 4 MG/2ML IJ SOLN: 4.0000 mg | Freq: Once | INTRAMUSCULAR | Status: DC | PRN

## 2019-11-05 MED ORDER — SODIUM CHLORIDE 0.9 % IR SOLN
Status: DC | PRN
  Administered 2019-11-05: 2000 mL
  Administered 2019-11-05: 1000 mL

## 2019-11-05 MED ORDER — ROCURONIUM 10MG/ML (10ML) SYRINGE FOR MEDFUSION PUMP - OPTIME
INTRAVENOUS | Status: DC | PRN
  Administered 2019-11-05: 30 mg via INTRAVENOUS

## 2019-11-05 MED ORDER — PROPOFOL 10 MG/ML IV BOLUS
INTRAVENOUS | Status: DC | PRN
  Administered 2019-11-05: 100 mg via INTRAVENOUS

## 2019-11-05 MED ORDER — HYDRALAZINE HCL 20 MG/ML IJ SOLN
10.0000 mg | Freq: Four times a day (QID) | INTRAMUSCULAR | Status: DC | PRN
  Administered 2019-11-05: 10 mg via INTRAVENOUS
  Filled 2019-11-05: qty 1

## 2019-11-05 SURGICAL SUPPLY — 52 items
APPLIER CLIP 11 MED OPEN (CLIP)
APPLIER CLIP 13 LRG OPEN (CLIP)
BARRIER SKIN 2 3/4 (OSTOMY) IMPLANT
BARRIER SKIN 2 3/4 INCH (OSTOMY)
CHLORAPREP W/TINT 26 (MISCELLANEOUS) ×4 IMPLANT
CLAMP POUCH DRAINAGE QUIET (OSTOMY) IMPLANT
CLIP APPLIE 11 MED OPEN (CLIP) IMPLANT
CLIP APPLIE 13 LRG OPEN (CLIP) IMPLANT
CLOTH BEACON ORANGE TIMEOUT ST (SAFETY) ×4 IMPLANT
COVER LIGHT HANDLE STERIS (MISCELLANEOUS) ×8 IMPLANT
COVER WAND RF STERILE (DRAPES) ×4 IMPLANT
DRAPE WARM FLUID 44X44 (DRAPES) ×4 IMPLANT
DRSG OPSITE POSTOP 4X10 (GAUZE/BANDAGES/DRESSINGS) ×4 IMPLANT
ELECT BLADE 6 FLAT ULTRCLN (ELECTRODE) IMPLANT
ELECT REM PT RETURN 9FT ADLT (ELECTROSURGICAL) ×4
ELECTRODE REM PT RTRN 9FT ADLT (ELECTROSURGICAL) ×2 IMPLANT
GLOVE BIOGEL PI IND STRL 7.0 (GLOVE) ×4 IMPLANT
GLOVE BIOGEL PI INDICATOR 7.0 (GLOVE) ×4
GLOVE SURG SS PI 7.5 STRL IVOR (GLOVE) ×4 IMPLANT
GOWN STRL REUS W/TWL LRG LVL3 (GOWN DISPOSABLE) ×12 IMPLANT
HANDLE SUCTION POOLE (INSTRUMENTS) ×2 IMPLANT
INST SET MAJOR GENERAL (KITS) ×4 IMPLANT
KIT CV MULTILUMEN 7FR 20 (SET/KITS/TRAYS/PACK) ×4
KIT CV MULTILUMEN 7FR 20 SUB (SET/KITS/TRAYS/PACK) ×2 IMPLANT
KIT TURNOVER KIT A (KITS) ×4 IMPLANT
LIGASURE IMPACT 36 18CM CVD LR (INSTRUMENTS) ×4 IMPLANT
MANIFOLD NEPTUNE II (INSTRUMENTS) ×4 IMPLANT
NEEDLE HYPO 18GX1.5 BLUNT FILL (NEEDLE) ×4 IMPLANT
NEEDLE HYPO 22GX1.5 SAFETY (NEEDLE) ×4 IMPLANT
NS IRRIG 1000ML POUR BTL (IV SOLUTION) ×8 IMPLANT
PACK MAJOR ABDOMINAL (CUSTOM PROCEDURE TRAY) ×4 IMPLANT
PAD ARMBOARD 7.5X6 YLW CONV (MISCELLANEOUS) ×4 IMPLANT
POUCH OSTOMY 2 3/4  H 3804 (WOUND CARE)
POUCH OSTOMY 2 PC DRNBL 2.75 (WOUND CARE) IMPLANT
RELOAD PROXIMATE 75MM BLUE (ENDOMECHANICALS) ×4 IMPLANT
RETRACTOR WND ALEXIS 25 LRG (MISCELLANEOUS) ×2 IMPLANT
RTRCTR WOUND ALEXIS 25CM LRG (MISCELLANEOUS) ×4
SET BASIN LINEN APH (SET/KITS/TRAYS/PACK) ×4 IMPLANT
SPONGE LAP 18X18 RF (DISPOSABLE) ×4 IMPLANT
STAPLER GUN LINEAR PROX 60 (STAPLE) ×4 IMPLANT
STAPLER PROXIMATE 75MM BLUE (STAPLE) ×4 IMPLANT
STAPLER VISISTAT (STAPLE) ×4 IMPLANT
SUCTION POOLE HANDLE (INSTRUMENTS) ×4
SUT CHROMIC 0 SH (SUTURE) IMPLANT
SUT CHROMIC 2 0 SH (SUTURE) ×4 IMPLANT
SUT CHROMIC 3 0 SH 27 (SUTURE) IMPLANT
SUT PDS AB 0 CTX 60 (SUTURE) ×8 IMPLANT
SUT SILK 2 0 (SUTURE)
SUT SILK 2-0 18XBRD TIE 12 (SUTURE) IMPLANT
SUT SILK 3 0 SH CR/8 (SUTURE) ×8 IMPLANT
SYR 20ML LL LF (SYRINGE) ×8 IMPLANT
TRAY FOLEY MTR SLVR 16FR STAT (SET/KITS/TRAYS/PACK) ×4 IMPLANT

## 2019-11-05 NOTE — ED Triage Notes (Signed)
Pt reports nausea and vomiting x 3 days and fatigue.  Denies pain.  Denies diarrhea.  LBM was Thursday and was normal per pt.

## 2019-11-05 NOTE — H&P (Signed)
Laura Martin is an 86 y.o. female.   Chief Complaint: Abdominal pain and nausea HPI: Laura Martin is a 86 year old white female who presented to the emergency room with a 3-day history of worsening abdominal pain, decreased bowel movements, nausea and vomiting.  Laura Martin has never had surgery on her abdomen.  A CT scan of the abdomen reveals a closed loop obstruction with mesenteric edema and free fluid present, consistent with possible early ischemia of the bowel.  Laura Martin states it only hurts when her abdomen is touched.  Laura Martin is lying comfortable in the bed.  Past Medical History:  Diagnosis Date  . Complete heart block ([place])    a. s/p PPM 12/31/13:  Medtronic Adapta L (serial number [id number]) pacemaker  . Essential hypertension   . Goiter   . Mixed hyperlipidemia   . Spinal stenosis   . Thyroid nodule     Past Surgical History:  Procedure Laterality Date  . BIOPSY THYROID Left 11/2006  . BREAST BIOPSY Right 1990's   "calcium deposit"  . CATARACT EXTRACTION W/ INTRAOCULAR LENS  IMPLANT, BILATERAL Bilateral 2001  . DILATION AND CURETTAGE OF UTERUS     S/P miscarriage  . entropion  11/04/2017   right eye  . ESOPHAGEAL DILATION N/A 03/08/2015   Procedure: ESOPHAGEAL DILATION;  Surgeon: Rogene Houston, MD;  [facility];  Service: Endoscopy;  Laterality: N/A;  . ESOPHAGOGASTRODUODENOSCOPY N/A 03/08/2015   Procedure: ESOPHAGOGASTRODUODENOSCOPY (EGD);  Surgeon: Rogene Houston, MD;  [facility];  Service: Endoscopy;  Laterality: N/A;  3:00  . INSERT / REPLACE / REMOVE PACEMAKER  12/30/2013  . KNEE ARTHROSCOPY Left 1986  . PERMANENT PACEMAKER INSERTION N/A 12/30/2013   Procedure: PERMANENT PACEMAKER INSERTION;  Surgeon: Evans Lance, MD;  Location: [facility] CATH LAB;  Service: Cardiovascular;  Laterality: N/A;  . TONSILLECTOMY  ~ 1943  . TOTAL KNEE ARTHROPLASTY Left 09/2010    Family History  Problem Relation Age of Onset  . Cancer Other   . Stroke Other    Social History:   reports that Laura Martin has never smoked. Laura Martin has never used smokeless tobacco. Laura Martin reports that Laura Martin does not drink alcohol and does not use drugs.  Allergies:  Allergies  Allergen Reactions  . Codeine Nausea And Vomiting  . Tramadol Nausea And Vomiting  . Sulfa Antibiotics Rash    (Not in a hospital admission)   Results for orders placed or performed during the hospital encounter of 11/05/19 (from the past 48 hour(s))  Urinalysis, Routine w reflex microscopic Urine, Clean Catch     Status: Abnormal   Collection Time: 11/05/19  9:56 AM  Result Value Ref Range   Color, Urine YELLOW YELLOW   APPearance CLEAR CLEAR   Specific Gravity, Urine 1.038 (H) 1.005 - 1.030   pH 5.0 5.0 - 8.0   Glucose, UA NEGATIVE NEGATIVE mg/dL   Hgb urine dipstick NEGATIVE NEGATIVE   Bilirubin Urine NEGATIVE NEGATIVE   Ketones, ur NEGATIVE NEGATIVE mg/dL   Protein, ur NEGATIVE NEGATIVE mg/dL   Nitrite NEGATIVE NEGATIVE   Leukocytes,Ua NEGATIVE NEGATIVE    Comment: Performed at [facility], [address], [place], [place] [address]  SARS Coronavirus 2 by RT PCR (hospital order, performed in [place] hospital lab) Nasopharyngeal Nasopharyngeal Swab     Status: None   Collection Time: 11/05/19  9:56 AM   Specimen: Nasopharyngeal Swab  Result Value Ref Range   SARS Coronavirus 2 NEGATIVE NEGATIVE    Comment: (NOTE) SARS-CoV-2 target  nucleic acids are NOT DETECTED.  The SARS-CoV-2 RNA is generally detectable in upper and lower respiratory specimens during the acute phase of infection. The lowest concentration of SARS-CoV-2 viral copies this assay can detect is 250 copies / mL. A negative result does not preclude SARS-CoV-2 infection and should not be used as the sole basis for treatment or other Laura Martin management decisions.  A negative result may occur with improper specimen collection / handling, submission of specimen other than nasopharyngeal swab, presence of viral mutation(s) within the areas  targeted by this assay, and inadequate number of viral copies (<250 copies / mL). A negative result must be combined with clinical observations, Laura Martin history, and epidemiological information.  Fact Sheet for Patients:   StrictlyIdeas.no  Fact Sheet for Healthcare Providers: BankingDealers.co.za  This test is not yet approved or  cleared by the [place] FDA and has been authorized for detection and/or diagnosis of SARS-CoV-2 by FDA under an Emergency Use Authorization (EUA).  This EUA will remain in effect (meaning this test can be used) for the duration of the COVID-19 declaration under Section 564(b)(1) of the Act, 21 U.S.C. section 360bbb-3(b)(1), unless the authorization is terminated or revoked sooner.  Performed at [facility], [address] [place], [place] [address]   CBC with Differential     Status: Abnormal   Collection Time: 11/05/19 10:02 AM  Result Value Ref Range   WBC 20.9 (H) 4.0 - 10.5 K/uL   RBC 4.55 3.87 - 5.11 MIL/uL   Hemoglobin 14.4 12.0 - 15.0 g/dL   HCT 41.5 36 - 46 %   MCV 91.2 80.0 - 100.0 fL   MCH 31.6 26.0 - 34.0 pg   MCHC 34.7 30.0 - 36.0 g/dL   RDW 13.6 11.5 - 15.5 %   Platelets 268 150 - 400 K/uL   nRBC 0.0 0.0 - 0.2 %   Neutrophils Relative % 83 %   Neutro Abs 17.4 (H) 1.7 - 7.7 K/uL   Lymphocytes Relative 6 %   Lymphs Abs 1.2 0.7 - 4.0 K/uL   Monocytes Relative 10 %   Monocytes Absolute 2.2 (H) 0 - 1 K/uL   Eosinophils Relative 0 %   Eosinophils Absolute 0.0 0 - 0 K/uL   Basophils Relative 0 %   Basophils Absolute 0.0 0 - 0 K/uL   Immature Granulocytes 1 %   Abs Immature Granulocytes 0.10 (H) 0.00 - 0.07 K/uL    Comment: Performed at [facility], [address], [place], [place] [address]  Comprehensive metabolic panel     Status: Abnormal   Collection Time: 11/05/19 10:02 AM  Result Value Ref Range   Sodium 135 135 - 145 mmol/L   Potassium 4.0 3.5 - 5.1 mmol/L   Chloride 97 (L)  98 - 111 mmol/L   CO2 26 22 - 32 mmol/L   Glucose, Bld 181 (H) 70 - 99 mg/dL    Comment: Glucose reference range applies only to samples taken after fasting for at least 8 hours.   BUN 53 (H) 8 - 23 mg/dL   Creatinine, Ser 1.19 (H) 0.44 - 1.00 mg/dL   Calcium 9.4 8.9 - 10.3 mg/dL   Total Protein 6.6 6.5 - 8.1 g/dL   Albumin 3.9 3.5 - 5.0 g/dL   AST 18 15 - 41 U/L   ALT 12 0 - 44 U/L   Alkaline Phosphatase 39 38 - 126 U/L   Total Bilirubin 2.4 (H) 0.3 - 1.2 mg/dL   GFR calc non  Af Amer 40 (L) >60 mL/min   GFR calc Af Amer 46 (L) >60 mL/min   Anion gap 12 5 - 15    Comment: Performed at [facility], [address], [place], [place] [address]   CT ABDOMEN PELVIS W CONTRAST  Result Date: 11/05/2019 CLINICAL DATA:  Nausea and vomiting. EXAM: CT ABDOMEN AND PELVIS WITH CONTRAST TECHNIQUE: Multidetector CT imaging of the abdomen and pelvis was performed using the standard protocol following bolus administration of intravenous contrast. CONTRAST:  43mL OMNIPAQUE IOHEXOL 300 MG/ML  SOLN COMPARISON:  None. FINDINGS: Lower chest: Normal heart size. Coronary arterial vascular calcifications. Dependent atelectasis within the bilateral lower lobes. Incompletely visualized nodular opacities along the right minor fissure (image 1; series 4). Hepatobiliary: The liver is normal in size and contour. Stones in the gallbladder lumen. Pancreas: Unremarkable Spleen: Unremarkable Adrenals/Urinary Tract: Normal adrenal glands. Kidneys enhance symmetrically with contrast. No hydronephrosis. Urinary bladder is distended. Stomach/Bowel: Descending and sigmoid colonic diverticulosis. No CT evidence for acute diverticulitis. Multiple fluid and gaseous distended loops of bowel are demonstrated within the central abdomen. Within the right hemiabdomen there is a twisted loop of small bowel which is dilated, thick-walled and has associated mesenteric edema. There appears to be 2 transition points along the proximal and distal  margin of this abnormal appearing loop of small bowel, concerning for closed loop obstruction. The small bowel distal to this closed loop obstruction appears decompressed. No definite free intraperitoneal air. Vascular/Lymphatic: Normal caliber abdominal aorta. Peripheral calcified atherosclerotic plaque. No retroperitoneal lymphadenopathy. Reproductive: Uterus and adnexal structures are unremarkable. Other: Moderate volume free fluid in the pelvis. Musculoskeletal: No aggressive or acute appearing osseous lesions. Lower thoracic and lumbar spine degenerative changes. IMPRESSION: 1. There is a twisted loop of small bowel within the right hemiabdomen which is dilated, thick-walled and has associated mesenteric edema. There appears to be transition points along the proximal and distal margin of this abnormal appearing loop of small bowel, concerning for closed loop obstruction with associated vascular compromise. The small bowel proximal to this closed loop obstruction is dilated. The small bowel distal to the closed loop is decompressed 2. Moderate volume free fluid in the pelvis. 3. Cholelithiasis. 4. Incompletely visualized nodular opacities along the right minor fissure. Recommend follow-up chest CT in the nonacute setting for evaluation of the chest. 5. These results were called by telephone at the time of interpretation on 11/05/2019 at 12:23 pm to provider JOSEPH ZAMMIT , who verbally acknowledged these results. Electronically Signed   By: Lovey Newcomer M.D.   On: 11/05/2019 12:25   DG Chest Port 1 View  Result Date: 11/05/2019 CLINICAL DATA:  Preop for surgery EXAM: PORTABLE CHEST 1 VIEW COMPARISON:  12/31/2013 FINDINGS: E There is mild bilateral interstitial prominence. No focal consolidation. No pleural effusion or pneumothorax. The heart mediastinum are stable. There is a dual lead cardiac pacemaker. No acute osseous abnormality. There is moderate arthropathy of bilateral AC joints. 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 IMPRESSION: No acute cardiopulmonary disease. Electronically Signed   By: Kathreen Devoid   On: 11/05/2019 14:02    Review of Systems  Constitutional: Positive for appetite change.  HENT: Positive for hearing loss.   Eyes: Negative.   Respiratory: Negative.   Cardiovascular: Negative.   Gastrointestinal: Positive for abdominal pain, nausea and vomiting.  Endocrine: Negative.   Genitourinary: Negative.   Musculoskeletal: Negative.   Skin: Negative.   Neurological: Negative.   Hematological: Negative.   Psychiatric/Behavioral: Negative.  Blood pressure (!) 170/77, pulse 88, temperature 97.9 F (36.6 C), temperature source Oral, resp. rate (!) 21, height 5\' 7"  (1.702 m), weight 60.8 kg, SpO2 97 %. Physical Exam Vitals reviewed.  Constitutional:      Appearance: Normal appearance. Laura Martin is normal weight. Laura Martin is not ill-appearing.  HENT:     Head: Normocephalic and atraumatic.  Cardiovascular:     Rate and Rhythm: Normal rate and regular rhythm.     Heart sounds: Normal heart sounds. No murmur heard.  No gallop.      Comments: Pacemaker in place. Pulmonary:     Effort: Pulmonary effort is normal. No respiratory distress.     Breath sounds: Normal breath sounds. No stridor. No wheezing, rhonchi or rales.  Abdominal:     General: There is distension.     Palpations: Abdomen is soft. There is no mass.     Tenderness: There is abdominal tenderness. There is no guarding or rebound.     Hernia: No hernia is present.     Comments: Tender with fullness along the right side of the abdomen.  No rigidity is noted.  Skin:    General: Skin is warm and dry.  Neurological:     Mental Status: Laura Martin is alert and oriented to person, place, and time.   CT scan images personally reviewed  Assessment/Plan Impression: Closed-loop obstruction of small bowel, pacemaker dependent Plan: We will take to the operating room urgently for an exploratory laparotomy,  possible small bowel resection.  The risks and benefits of the procedure including bleeding, infection, the possibility of a bowel resection, and cardiopulmonary difficulties were fully explained to the Laura Martin, who gave informed consent.  Aviva Signs, MD 11/05/2019, 5:29 PM

## 2019-11-05 NOTE — ED Provider Notes (Signed)
[facility] EMERGENCY DEPARTMENT Provider Note   CSN: [id number] Arrival date & time: 11/05/19  9983     History Chief Complaint  Patient presents with  . Emesis    Laura Martin is a 86 y.o. female.  Nausea vomiting and some abdominal discomfort since Wednesday.  No blood in her vomit  The history is provided by the patient and medical records. No language interpreter was used.  Emesis Severity:  Moderate Timing:  Intermittent Quality:  Unable to specify Able to tolerate:  Liquids Progression:  Unchanged Chronicity:  New Recent urination:  Increased Context: not post-tussive   Relieved by:  Nothing Worsened by:  Nothing Ineffective treatments:  None tried Associated symptoms: abdominal pain   Associated symptoms: no cough, no diarrhea and no headaches        Past Medical History:  Diagnosis Date  . Complete heart block ([place])    a. s/p PPM 12/31/13:  Medtronic Adapta L (serial number [id number]) pacemaker  . Essential hypertension   . Goiter   . Mixed hyperlipidemia   . Spinal stenosis   . Thyroid nodule     Patient Active Problem List   Diagnosis Date Noted  . Elevated glucose 04/21/2019  . Anemia 04/21/2019  . Osteoporosis 04/21/2019  . Pacemaker 05/12/2014  . Complete heart block ([place]) 01/01/2014  . Mobitz type 2 second degree AV block 12/30/2013  . Heart murmur 11/11/2013  . Essential hypertension, benign 05/27/2011  . Mixed hyperlipidemia 05/27/2011    Past Surgical History:  Procedure Laterality Date  . BIOPSY THYROID Left 11/2006  . BREAST BIOPSY Right 1990's   "calcium deposit"  . CATARACT EXTRACTION W/ INTRAOCULAR LENS  IMPLANT, BILATERAL Bilateral 2001  . DILATION AND CURETTAGE OF UTERUS     S/P miscarriage  . entropion  11/04/2017   right eye  . ESOPHAGEAL DILATION N/A 03/08/2015   Procedure: ESOPHAGEAL DILATION;  Surgeon: Rogene Houston, MD;  Location: [facility] ENDO SUITE;  Service: Endoscopy;  Laterality: N/A;  .  ESOPHAGOGASTRODUODENOSCOPY N/A 03/08/2015   Procedure: ESOPHAGOGASTRODUODENOSCOPY (EGD);  Surgeon: Rogene Houston, MD;  Location: [facility] ENDO SUITE;  Service: Endoscopy;  Laterality: N/A;  3:00  . INSERT / REPLACE / REMOVE PACEMAKER  12/30/2013  . KNEE ARTHROSCOPY Left 1986  . PERMANENT PACEMAKER INSERTION N/A 12/30/2013   Procedure: PERMANENT PACEMAKER INSERTION;  Surgeon: Evans Lance, MD;  Location: [facility] CATH LAB;  Service: Cardiovascular;  Laterality: N/A;  . TONSILLECTOMY  ~ 1943  . TOTAL KNEE ARTHROPLASTY Left 09/2010     OB History   No obstetric history on file.     Family History  Problem Relation Age of Onset  . Cancer Other   . Stroke Other     Social History   Tobacco Use  . Smoking status: Never Smoker  . Smokeless tobacco: Never Used  Vaping Use  . Vaping Use: Never used  Substance Use Topics  . Alcohol use: No    Alcohol/week: 0.0 standard drinks  . Drug use: No    Home Medications Prior to Admission medications   Medication Sig Start Date End Date Taking? Authorizing Provider  acetaminophen (TYLENOL) 500 MG tablet Take 500 mg by mouth every 6 (six) hours as needed.    [provider]  amLODipine (NORVASC) 10 MG tablet Take 10 mg by mouth daily.    [provider]  Calcium Carbonate (CALCIUM 600 PO) Take 600 mg by mouth daily. Takes 3 tabs daily    [provider]  cholecalciferol (VITAMIN D) 400 UNITS TABS tablet Take 400 Units by mouth.    [provider]  Coenzyme Q10 (CO Q 10) 100 MG CAPS Take 100 mg by mouth daily.    [provider]  diphenhydrAMINE (BENADRYL) 25 MG tablet Take 25 mg by mouth at bedtime as needed for sleep.    [provider]  doxycycline (VIBRAMYCIN) 100 MG capsule Take 1 capsule (100 mg total) by mouth 2 (two) times daily. 11/02/19   Wurst, [place], PA-C  fish oil-omega-3 fatty acids 1000 MG capsule Take 1 g by mouth daily.    [provider]  HYDROcodone-acetaminophen  (NORCO/VICODIN) 5-325 MG tablet Take 2 tablets by mouth every 4 (four) hours as needed. 04/05/19   Evalee Jefferson, PA-C    Allergies    Codeine, Tramadol, and Sulfa antibiotics  Review of Systems   Review of Systems  Constitutional: Negative for appetite change and fatigue.  HENT: Negative for congestion, ear discharge and sinus pressure.   Eyes: Negative for discharge.  Respiratory: Negative for cough.   Cardiovascular: Negative for chest pain.  Gastrointestinal: Positive for abdominal pain and vomiting. Negative for diarrhea.  Genitourinary: Negative for frequency and hematuria.  Musculoskeletal: Negative for back pain.  Skin: Negative for rash.  Neurological: Negative for seizures and headaches.  Psychiatric/Behavioral: Negative for hallucinations.    Physical Exam Updated Vital Signs BP (!) 146/87   Pulse 79   Temp 97.9 F (36.6 C) (Oral)   Resp (!) 28   Ht 5\' 7"  (1.702 m)   Wt 60.8 kg   SpO2 93%   BMI 20.99 kg/m   Physical Exam Vitals and nursing note reviewed.  Constitutional:      Appearance: She is well-developed.  HENT:     Head: Normocephalic.     Nose: Nose normal.  Eyes:     General: No scleral icterus.    Conjunctiva/sclera: Conjunctivae normal.  Neck:     Thyroid: No thyromegaly.  Cardiovascular:     Rate and Rhythm: Normal rate and regular rhythm.     Heart sounds: No murmur heard.  No friction rub. No gallop.   Pulmonary:     Breath sounds: No stridor. No wheezing or rales.  Chest:     Chest wall: No tenderness.  Abdominal:     General: There is no distension.     Tenderness: There is no abdominal tenderness. There is no rebound.  Musculoskeletal:        General: Normal range of motion.     Cervical back: Neck supple.  Lymphadenopathy:     Cervical: No cervical adenopathy.  Skin:    Findings: No erythema or rash.  Neurological:     Mental Status: She is alert and oriented to person, place, and time.     Motor: No abnormal muscle tone.      Coordination: Coordination normal.  Psychiatric:        Behavior: Behavior normal.     ED Results / Procedures / Treatments   Labs (all labs ordered are listed, but only abnormal results are displayed) Labs Reviewed  CBC WITH DIFFERENTIAL/PLATELET - Abnormal; Notable for the following components:      Result Value   WBC 20.9 (*)    Neutro Abs 17.4 (*)    Monocytes Absolute 2.2 (*)    Abs Immature Granulocytes 0.10 (*)    All other components within normal limits  COMPREHENSIVE METABOLIC PANEL - Abnormal; Notable for the following components:   Chloride 97 (*)  Glucose, Bld 181 (*)    BUN 53 (*)    Creatinine, Ser 1.19 (*)    Total Bilirubin 2.4 (*)    GFR calc non Af Amer 40 (*)    GFR calc Af Amer 46 (*)    All other components within normal limits  SARS CORONAVIRUS 2 BY RT PCR [facility] ORDER, [place] LAB)  URINALYSIS, ROUTINE W REFLEX MICROSCOPIC    EKG EKG Interpretation  Date/Time:  Saturday November 05 2019 09:43:08 EDT Ventricular Rate:  90 PR Interval:  166 QRS Duration: 158 QT Interval:  416 QTC Calculation: 508 R Axis:   -70 Text Interpretation: Atrial-sensed ventricular-paced rhythm with frequent Premature ventricular complexes Abnormal ECG Confirmed by Milton Ferguson [phone]) on 11/05/2019 12:57:47 PM   Radiology CT ABDOMEN PELVIS W CONTRAST  Result Date: 11/05/2019 CLINICAL DATA:  Nausea and vomiting. EXAM: CT ABDOMEN AND PELVIS WITH CONTRAST TECHNIQUE: Multidetector CT imaging of the abdomen and pelvis was performed using the standard protocol following bolus administration of intravenous contrast. CONTRAST:  56mL OMNIPAQUE IOHEXOL 300 MG/ML  SOLN COMPARISON:  None. FINDINGS: Lower chest: Normal heart size. Coronary arterial vascular calcifications. Dependent atelectasis within the bilateral lower lobes. Incompletely visualized nodular opacities along the right minor fissure (image 1; series 4). Hepatobiliary: The liver is normal in  size and contour. Stones in the gallbladder lumen. Pancreas: Unremarkable Spleen: Unremarkable Adrenals/Urinary Tract: Normal adrenal glands. Kidneys enhance symmetrically with contrast. No hydronephrosis. Urinary bladder is distended. Stomach/Bowel: Descending and sigmoid colonic diverticulosis. No CT evidence for acute diverticulitis. Multiple fluid and gaseous distended loops of bowel are demonstrated within the central abdomen. Within the right hemiabdomen there is a twisted loop of small bowel which is dilated, thick-walled and has associated mesenteric edema. There appears to be 2 transition points along the proximal and distal margin of this abnormal appearing loop of small bowel, concerning for closed loop obstruction. The small bowel distal to this closed loop obstruction appears decompressed. No definite free intraperitoneal air. Vascular/Lymphatic: Normal caliber abdominal aorta. Peripheral calcified atherosclerotic plaque. No retroperitoneal lymphadenopathy. Reproductive: Uterus and adnexal structures are unremarkable. Other: Moderate volume free fluid in the pelvis. Musculoskeletal: No aggressive or acute appearing osseous lesions. Lower thoracic and lumbar spine degenerative changes. IMPRESSION: 1. There is a twisted loop of small bowel within the right hemiabdomen which is dilated, thick-walled and has associated mesenteric edema. There appears to be transition points along the proximal and distal margin of this abnormal appearing loop of small bowel, concerning for closed loop obstruction with associated vascular compromise. The small bowel proximal to this closed loop obstruction is dilated. The small bowel distal to the closed loop is decompressed 2. Moderate volume free fluid in the pelvis. 3. Cholelithiasis. 4. Incompletely visualized nodular opacities along the right minor fissure. Recommend follow-up chest CT in the nonacute setting for evaluation of the chest. 5. These results were called by  telephone at the time of interpretation on 11/05/2019 at 12:23 pm to provider Tenita Cue , who verbally acknowledged these results. Electronically Signed   By: Lovey Newcomer M.D.   On: 11/05/2019 12:25    Procedures Procedures (including critical care time)  Medications Ordered in ED Medications  piperacillin-tazobactam (ZOSYN) IVPB 3.375 g (has no administration in time range)  sodium chloride 0.9 % bolus 500 mL (0 mLs Intravenous Stopped 11/05/19 1230)  ondansetron (ZOFRAN) injection 4 mg (4 mg Intravenous Given 11/05/19 1131)  iohexol (OMNIPAQUE) 300 MG/ML solution 75 mL (75 mLs Intravenous Contrast Given  11/05/19 1144)   CRITICAL CARE Performed by: Milton Ferguson Total critical care time: 40 minutes Critical care time was exclusive of separately billable procedures and treating other patients. Critical care was necessary to treat or prevent imminent or life-threatening deterioration. Critical care was time spent personally by me on the following activities: development of treatment plan with patient and/or surrogate as well as nursing, discussions with consultants, evaluation of patient's response to treatment, examination of patient, obtaining history from patient or surrogate, ordering and performing treatments and interventions, ordering and review of laboratory studies, ordering and review of radiographic studies, pulse oximetry and re-evaluation of patient's condition.' ED Course  I have reviewed the triage vital signs and the nursing notes.  Pertinent labs & imaging results that were available during my care of the patient were reviewed by me and considered in my medical decision making (see chart for details).    MDM Rules/Calculators/A&P                           CT scan shows small bowel closed-loop obstruction with vascular compromise.  General surgery to see patient         This patient presents to the ED for concern of nausea, this involves an extensive number of  treatment options, and is a complaint that carries with it a high risk of complications and morbidity.  The differential diagnosis includes appendicitis bowel obstruction   Lab Tests:   I Ordered, reviewed, and interpreted labs, which included CBC and chemistries.  White count 21,000  Medicines ordered:  I ordered medication Zofran for nausea Imaging Studies ordered:   I ordered imaging studies which included CT abdomen  I independently visualized and interpreted imaging which showed small bowel obstruction  Additional history obtained:   Additional history obtained from daughter  Previous records obtained and reviewed.  Consultations Obtained:   I consulted general surgery and discussed lab and imaging findings  Reevaluation:  After the interventions stated above, I reevaluated the patient and found mild improvement  Critical Interventions:  .   Final Clinical Impression(s) / ED Diagnoses Final diagnoses:  Pain of upper abdomen    Rx / DC Orders ED Discharge Orders    None       Milton Ferguson, MD 11/05/19 1315

## 2019-11-05 NOTE — Anesthesia Preprocedure Evaluation (Addendum)
Anesthesia Evaluation  Patient identified by MRN, date of birth, ID band Patient awake    Reviewed: Allergy & Precautions, H&P , NPO status , Patient's Chart, lab work & pertinent test results, reviewed documented beta blocker date and time   Airway Mallampati: II  TM Distance: >3 FB Neck ROM: full    Dental no notable dental hx. (+) Teeth Intact   Pulmonary neg pulmonary ROS,    Pulmonary exam normal breath sounds clear to auscultation       Cardiovascular Exercise Tolerance: Good hypertension, + dysrhythmias (Complete heart block) + pacemaker  Rhythm:regular Rate:Normal     Neuro/Psych negative neurological ROS  negative psych ROS   GI/Hepatic negative GI ROS, Neg liver ROS,   Endo/Other  negative endocrine ROS  Renal/GU negative Renal ROS  negative genitourinary   Musculoskeletal   Abdominal   Peds  Hematology  (+) Blood dyscrasia, anemia ,   Anesthesia Other Findings   Reproductive/Obstetrics negative OB ROS                            Anesthesia Physical Anesthesia Plan  ASA: IV and emergent  Anesthesia Plan: General   Post-op Pain Management:    Induction:   PONV Risk Score and Plan: 2 and Ondansetron  Airway Management Planned:   Additional Equipment:   Intra-op Plan:   Post-operative Plan:   Informed Consent: I have reviewed the patients History and Physical, chart, labs and discussed the procedure including the risks, benefits and alternatives for the proposed anesthesia with the patient or authorized representative who has indicated his/her understanding and acceptance.     Dental Advisory Given  Plan Discussed with: CRNA  Anesthesia Plan Comments:         Anesthesia Quick Evaluation

## 2019-11-05 NOTE — H&P (Signed)
History and Physical  Yaresly Menzel [facility] KZL:[id number] DOB: 15-Mar-1926 DOA: 11/05/2019   PCP: Celene Squibb, MD   Patient coming from: Home  Chief Complaint: Nausea, vomiting, abdominal pain  HPI:  Laura Martin is a 86 y.o. female with medical history of complete heart block status post permanent pacemaker, hypertension, hyperlipidemia presenting with 3-day history of nausea, vomiting, and abdominal pain that began on 11/02/2019.  The patient denies any fevers, chills, diarrhea, hematochezia, melena, dysuria, hematuria.  She is passing flatus.  Her last bowel movement was on 11/04/2019.  She denies any headache, neck pain, chest pain, shortness breath, palpitation, syncope.  There is no medical history of stroke or coronary artery disease. She has been unable to keep down any food.  She has been able to tolerate some sips of water with her medications. In the emergency department, the patient was afebrile hemodynamically stable with oxygen saturation 94-97% on room air.  BMP was unremarkable.  WBC 20.0, hemoglobin 14.4, platelets 260,000.  AST 18, ALT 16, alk phosphatase 39, total bilirubin 2.4.  CT of the abdomen and pelvis showed a twisted loop of small bowel which is thickened with mesenteric edema with concern for vascular compromise.  There is a transition point along the proximal and distal margins of the loop of small bowel consistent with a closed loop obstruction.  General surgery was consulted to assist with management.  The patient was started on Zosyn and IV fluids.  Assessment/Plan: Small bowel obstruction -General surgery consulted -Remain n.p.o. -Start IV fluids -Continue Zosyn -Judicious opioids  Complete heart block status post PPM  -Personally reviewed EKG--V paced -Patient denies any cardiac type symptoms, dizziness, syncope  Essential hypertension -Holding amlodipine until able to tolerate p.o. -Hydralazine prn SBP>180  Hyperlipidemia -Restart fish oil once able  to tolerate p.o.  Hyperglycemia -check A1C       Past Medical History:  Diagnosis Date  . Complete heart block ([place])    a. s/p PPM 12/31/13:  Medtronic Adapta L (serial number [id number]) pacemaker  . Essential hypertension   . Goiter   . Mixed hyperlipidemia   . Spinal stenosis   . Thyroid nodule    Past Surgical History:  Procedure Laterality Date  . BIOPSY THYROID Left 11/2006  . BREAST BIOPSY Right 1990's   "calcium deposit"  . CATARACT EXTRACTION W/ INTRAOCULAR LENS  IMPLANT, BILATERAL Bilateral 2001  . DILATION AND CURETTAGE OF UTERUS     S/P miscarriage  . entropion  11/04/2017   right eye  . ESOPHAGEAL DILATION N/A 03/08/2015   Procedure: ESOPHAGEAL DILATION;  Surgeon: Rogene Houston, MD;  Location: [facility] END[facility] SU[facility];  Service: Endoscopy;  Laterality: N/A;  . ESOPHAGOGASTRODUODENOSCOPY N/A 03/08/2015   Procedure: ESOPHAGOGASTRODUODENOSCOPY (EGD);  Surgeon: Rogene Houston, MD;  Location: [facility] ENDO SUITE;  Service: Endoscopy;  Laterality: N/A;  3:00  . INSERT / REPLACE / REMOVE PACEMAKER  12/30/2013  . KNEE ARTHROSCOPY Left 1986  . PERMANENT PACEMAKER INSERTION N/A 12/30/2013   Procedure: PERMANENT PACEMAKER INSERTION;  Surgeon: Evans Lance, MD;  Location: [facility] CATH LAB;  Service: Cardiovascular;  Laterality: N/A;  . TONSILLECTOMY  ~ 1943  . TOTAL KNEE ARTHROPLASTY Left 09/2010   Social History:  reports that she has never smoked. She has never used smokeless tobacco. She reports that she does not drink alcohol and does not use drugs.   Family History  Problem Relation Age of Onset  . Cancer Other   . Stroke Other  Allergies  Allergen Reactions  . Codeine Nausea And Vomiting  . Tramadol Nausea And Vomiting  . Sulfa Antibiotics Rash     Prior to Admission medications   Medication Sig Start Date End Date Taking? Authorizing Provider  acetaminophen (TYLENOL) 500 MG tablet Take 500 mg by mouth every 6 (six) hours as needed.    [provider]   amLODipine (NORVASC) 10 MG tablet Take 10 mg by mouth daily.    [provider]  Calcium Carbonate (CALCIUM 600 PO) Take 600 mg by mouth daily. Takes 3 tabs daily    [provider]  cholecalciferol (VITAMIN D) 400 UNITS TABS tablet Take 400 Units by mouth.    [provider]  Coenzyme Q10 (CO Q 10) 100 MG CAPS Take 100 mg by mouth daily.    [provider]  diphenhydrAMINE (BENADRYL) 25 MG tablet Take 25 mg by mouth at bedtime as needed for sleep.    [provider]  doxycycline (VIBRAMYCIN) 100 MG capsule Take 1 capsule (100 mg total) by mouth 2 (two) times daily. 11/02/19   Wurst, [place], PA-C  fish oil-omega-3 fatty acids 1000 MG capsule Take 1 g by mouth daily.    [provider]  HYDROcodone-acetaminophen (NORCO/VICODIN) 5-325 MG tablet Take 2 tablets by mouth every 4 (four) hours as needed. 04/05/19   Evalee Jefferson, PA-C    Review of Systems:  Constitutional:  No weight loss, night sweats, Fevers, chills, fatigue.  Head&Eyes: No headache.  No vision loss.  No eye pain or scotoma ENT:  No Difficulty swallowing,Tooth/dental problems,Sore throat,  No ear ache, post nasal drip,  Cardio-vascular:  No chest pain, Orthopnea, PND, swelling in lower extremities,  dizziness, palpitations  GI:  No   diarrhea, loss of appetite, hematochezia, melena, heartburn, indigestion, Resp:  No shortness of breath with exertion or at rest. No cough. No coughing up of blood .No wheezing.No chest wall deformity  Skin:  no rash or lesions.  GU:  no dysuria, change in color of urine, no urgency or frequency. No flank pain.  Musculoskeletal:  No joint pain or swelling. No decreased range of motion. No back pain.  Psych:  No change in mood or affect. No depression or anxiety. Neurologic: No headache, no dysesthesia, no focal weakness, no vision loss. No syncope  Physical Exam: Vitals:   11/05/19 1130 11/05/19 1131 11/05/19 1200 11/05/19 1230  BP:  (!) 159/75 (!) 159/75 (!) 173/60 (!) 146/87  Pulse: 85 83 (!) 41 79  Resp: (!) 21 20 (!) 21 (!) 28  Temp:      TempSrc:      SpO2: 96% 95% 97% 93%  Weight:      Height:       General:  A&O x 3, NAD, nontoxic, pleasant/cooperative Head/Eye: No conjunctival hemorrhage, no icterus, [place]/AT, No nystagmus ENT:  No icterus,  No thrush, good dentition, no pharyngeal exudate Neck:  No masses, no lymphadenpathy, no bruits CV:  RRR, no rub, no gallop, no S3 Lung:  CTAB, good air movement, no wheeze, no rhonchi Abdomen: soft/mild peribumbilical tenderness, +BS, nondistended, no peritoneal signs Ext: No cyanosis, No rashes, No petechiae, No lymphangitis, No edema Neuro: CNII-XII intact, strength 4/5 in bilateral upper and lower extremities, no dysmetria  Labs on Admission:  Basic Metabolic Panel: Recent Labs  Lab 11/05/19 1002  NA 135  K 4.0  CL 97*  CO2 26  GLUCOSE 181*  BUN 53*  CREATININE 1.19*  CALCIUM 9.4   Liver  Function Tests: Recent Labs  Lab 11/05/19 1002  AST 18  ALT 12  ALKPHOS 39  BILITOT 2.4*  PROT 6.6  ALBUMIN 3.9   No results for input(s): LIPASE, AMYLASE in the last 168 hours. No results for input(s): AMMONIA in the last 168 hours. CBC: Recent Labs  Lab 11/05/19 1002  WBC 20.9*  NEUTROABS 17.4*  HGB 14.4  HCT 41.5  MCV 91.2  PLT 268   Coagulation Profile: No results for input(s): INR, PROTIME in the last 168 hours. Cardiac Enzymes: No results for input(s): CKTOTAL, CKMB, CKMBINDEX, TROPONINI in the last 168 hours. BNP: Invalid input(s): POCBNP CBG: No results for input(s): GLUCAP in the last 168 hours. Urine analysis:    Component Value Date/Time   COLORURINE YELLOW 10/10/2010 1055   APPEARANCEUR CLEAR 10/10/2010 1055   LABSPEC 1.012 10/10/2010 1055   PHURINE 7.0 10/10/2010 1055   GLUCOSEU NEGATIVE 10/10/2010 1055   HGBUR NEGATIVE 10/10/2010 1055   BILIRUBINUR NEGATIVE 10/10/2010 1055   KETONESUR NEGATIVE 10/10/2010 1055   PROTEINUR  NEGATIVE 10/10/2010 1055   UROBILINOGEN 0.2 10/10/2010 1055   NITRITE NEGATIVE 10/10/2010 1055   LEUKOCYTESUR NEGATIVE 10/10/2010 1055   Sepsis Labs: '@LABRCNTIP'$ (procalcitonin:4,lacticidven:4) ) Recent Results (from the past 240 hour(s))  SARS Coronavirus 2 by RT PCR (hospital order, performed in [place] hospital lab) Nasopharyngeal Nasopharyngeal Swab     Status: None   Collection Time: 11/05/19  9:56 AM   Specimen: Nasopharyngeal Swab  Result Value Ref Range Status   SARS Coronavirus 2 NEGATIVE NEGATIVE Final    Comment: (NOTE) SARS-CoV-2 target nucleic acids are NOT DETECTED.  The SARS-CoV-2 RNA is generally detectable in upper and lower respiratory specimens during the acute phase of infection. The lowest concentration of SARS-CoV-2 viral copies this assay can detect is 250 copies / mL. A negative result does not preclude SARS-CoV-2 infection and should not be used as the sole basis for treatment or other patient management decisions.  A negative result may occur with improper specimen collection / handling, submission of specimen other than nasopharyngeal swab, presence of viral mutation(s) within the areas targeted by this assay, and inadequate number of viral copies (<250 copies / mL). A negative result must be combined with clinical observations, patient history, and epidemiological information.  Fact Sheet for Patients:   StrictlyIdeas.no  Fact Sheet for Healthcare Providers: BankingDealers.co.za  This test is not yet approved or  cleared by the [place] FDA and has been authorized for detection and/or diagnosis of SARS-CoV-2 by FDA under an Emergency Use Authorization (EUA).  This EUA will remain in effect (meaning this test can be used) for the duration of the COVID-19 declaration under Section 564(b)(1) of the Act, 21 U.S.C. section 360bbb-3(b)(1), unless the authorization is terminated or revoked  sooner.  Performed at [facility], [address], [place], [place] [address]      Radiological Exams on Admission: CT ABDOMEN PELVIS W CONTRAST  Result Date: 11/05/2019 CLINICAL DATA:  Nausea and vomiting. EXAM: CT ABDOMEN AND PELVIS WITH CONTRAST TECHNIQUE: Multidetector CT imaging of the abdomen and pelvis was performed using the standard protocol following bolus administration of intravenous contrast. CONTRAST:  54m OMNIPAQUE IOHEXOL 300 MG/ML  SOLN COMPARISON:  None. FINDINGS: Lower chest: Normal heart size. Coronary arterial vascular calcifications. Dependent atelectasis within the bilateral lower lobes. Incompletely visualized nodular opacities along the right minor fissure (image 1; series 4). Hepatobiliary: The liver is normal in size and contour. Stones in the gallbladder lumen. Pancreas: Unremarkable Spleen: Unremarkable  Adrenals/Urinary Tract: Normal adrenal glands. Kidneys enhance symmetrically with contrast. No hydronephrosis. Urinary bladder is distended. Stomach/Bowel: Descending and sigmoid colonic diverticulosis. No CT evidence for acute diverticulitis. Multiple fluid and gaseous distended loops of bowel are demonstrated within the central abdomen. Within the right hemiabdomen there is a twisted loop of small bowel which is dilated, thick-walled and has associated mesenteric edema. There appears to be 2 transition points along the proximal and distal margin of this abnormal appearing loop of small bowel, concerning for closed loop obstruction. The small bowel distal to this closed loop obstruction appears decompressed. No definite free intraperitoneal air. Vascular/Lymphatic: Normal caliber abdominal aorta. Peripheral calcified atherosclerotic plaque. No retroperitoneal lymphadenopathy. Reproductive: Uterus and adnexal structures are unremarkable. Other: Moderate volume free fluid in the pelvis. Musculoskeletal: No aggressive or acute appearing osseous lesions. Lower thoracic and lumbar  spine degenerative changes. IMPRESSION: 1. There is a twisted loop of small bowel within the right hemiabdomen which is dilated, thick-walled and has associated mesenteric edema. There appears to be transition points along the proximal and distal margin of this abnormal appearing loop of small bowel, concerning for closed loop obstruction with associated vascular compromise. The small bowel proximal to this closed loop obstruction is dilated. The small bowel distal to the closed loop is decompressed 2. Moderate volume free fluid in the pelvis. 3. Cholelithiasis. 4. Incompletely visualized nodular opacities along the right minor fissure. Recommend follow-up chest CT in the nonacute setting for evaluation of the chest. 5. These results were called by telephone at the time of interpretation on 11/05/2019 at 12:23 pm to provider JOSEPH ZAMMIT , who verbally acknowledged these results. Electronically Signed   By: Lovey Newcomer M.D.   On: 11/05/2019 12:25   DG Chest Port 1 View  Result Date: 11/05/2019 CLINICAL DATA:  Preop for surgery EXAM: PORTABLE CHEST 1 VIEW COMPARISON:  12/31/2013 FINDINGS: E There is mild bilateral interstitial prominence. No focal consolidation. No pleural effusion or pneumothorax. The heart mediastinum are stable. There is a dual lead cardiac pacemaker. No acute osseous abnormality. There is moderate arthropathy of bilateral AC joints. 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 IMPRESSION: No acute cardiopulmonary disease. Electronically Signed   By: Kathreen Devoid   On: 11/05/2019 14:02    EKG: Independently reviewed. vpaced    Time spent:60 minutes Code Status:   FULL Family Communication:  Daughter updated at bedside 8/14 Disposition Plan: expect 2-3 day hospitalization Consults called: general surgery DVT Prophylaxis: [place] Heparin   Orson Eva, DO  [facility] Pager [phone]  If 7PM-7AM, please contact night-coverage [email or web address] Password  [facility] 11/05/2019, 2:13 PM

## 2019-11-05 NOTE — Progress Notes (Signed)
Have placed incentive in room.

## 2019-11-05 NOTE — Op Note (Signed)
Patient:  Laura Martin  DOB:  November 05, 1925  MRN:  [id number]   Preop Diagnosis: Small bowel obstruction  Postop Diagnosis: Same, adhesive disease  Procedure: Exploratory laparotomy, lysis of adhesion, partial small bowel obstruction, central line placement  Surgeon: Aviva Signs, MD  Anes: General endotracheal  Indications: Patient is a 86 year old white female who presented to the emergency room with a 3-day history of worsening abdominal pain, nausea, and vomiting.  CT scan of the abdomen revealed a closed-loop obstruction with possible mesenteric edema and bowel wall thickening consistent with ischemia.  The patient now comes to the operating room for emergent exploratory laparotomy.  The risks and benefits of the procedure including bleeding, infection, cardiopulmonary difficulties, and the possibility of a bowel resection were fully explained to the patient, who gave informed consent.  Procedure note: The patient was placed in the supine position.  Just prior to induction, IV access was lost and could not be accessed peripherally.  I then placed a right femoral vein triple-lumen catheter under sterile conditions.  Good backflow of venous blood was noted on aspiration of all 3 ports.  All 3 ports were flushed with saline.  We then were able to use the ports to induce the patient.  After induction, the abdomen was prepped and draped using the usual sterile technique with ChloraPrep.  Surgical site confirmation was performed.  The midline incision was made around the umbilicus.  Peritoneal cavity was entered into without difficulty.  Serosanguineous fluid was present in the abdomen.  Upon trying to eviscerate the small bowel, a large segment of small bowel was noted to be ischemic and hemorrhagic.  It appeared that there were omental adhesions around the base of the mesentery towards the right side of the abdomen.  A LigaSure was used to divide these omental adhesions until the bowel was freed  up.  It appeared that the ischemic area of the small bowel was located in the midportion of the small bowel.  Distally, the bowel was decompressed down to the terminal ileum.  I then went proximally up to the ligament of Treitz and no other adhesions were noted.  It was elected to proceed with the resection of the small bowel.  Approximately 50 cm was resected.  A GIA stapler was placed proximally distally around the ischemic segment.  The bowel mesentery was then ligated using the LigaSure.  The specimen was sent to pathology for further examination.  A side-to-side enteroenterostomy was done using a GIA-75 stapler.  The enterotomy was closed using a TA 60 stapler.  The staple line was bolstered using 3-0 silk Lembert sutures.  The mesenteric defect was closed using 2-0 Chromic Gut running suture.  The bowel was returned into the abdominal cavity in an orderly fashion.  The abdomen was then copiously irrigated with normal saline.  The fascia was reapproximated using a looped 0 PDS running suture.  Subcutaneous layer was irrigated with normal saline.  Exparel was instilled into the surrounding wound.  The skin was closed using staples.  Betadine ointment and dry sterile dressing were applied.  All tape and needle counts were correct at the end of the procedure.  The patient was extubated in the operating room and transferred to the ICU in guarded but stable condition.  Complications: None  EBL: 25 cc  Specimen: Mid small bowel

## 2019-11-05 NOTE — Progress Notes (Signed)
Patient arrived to unit at 54. PACU nurse at bedside to recover patient. Patient stable, alert and oriented.

## 2019-11-05 NOTE — Anesthesia Procedure Notes (Signed)
Procedure Name: Intubation Date/Time: 11/05/2019 6:51 PM Performed by: Louann Sjogren, MD Pre-anesthesia Checklist: Patient identified, Emergency Drugs available, Suction available, Patient being monitored and Timeout performed Patient Re-evaluated:Patient Re-evaluated prior to induction Oxygen Delivery Method: Circle system utilized Preoxygenation: Pre-oxygenation with 100% oxygen Induction Type: IV induction and Rapid sequence Laryngoscope Size: Mac and 3 Grade View: Grade III Tube type: Oral Laser Tube: Cuffed inflated with minimal occlusive pressure - saline Tube size: 7.0 mm Number of attempts: 1 Airway Equipment and Method: Patient positioned with wedge pillow and Stylet Placement Confirmation: ETT inserted through vocal cords under direct vision,  positive ETCO2,  CO2 detector and breath sounds checked- equal and bilateral Secured at: 19 cm Tube secured with: Tape Dental Injury: Teeth and Oropharynx as per pre-operative assessment

## 2019-11-06 DIAGNOSIS — I442 Atrioventricular block, complete: Secondary | ICD-10-CM

## 2019-11-06 DIAGNOSIS — K56609 Unspecified intestinal obstruction, unspecified as to partial versus complete obstruction: Secondary | ICD-10-CM

## 2019-11-06 LAB — CBC
HCT: 34.3 % — ABNORMAL LOW (ref 36.0–46.0)
Hemoglobin: 11.8 g/dL — ABNORMAL LOW (ref 12.0–15.0)
MCH: 31.8 pg (ref 26.0–34.0)
MCHC: 34.4 g/dL (ref 30.0–36.0)
MCV: 92.5 fL (ref 80.0–100.0)
Platelets: 216 10*3/uL (ref 150–400)
RBC: 3.71 MIL/uL — ABNORMAL LOW (ref 3.87–5.11)
RDW: 13.7 % (ref 11.5–15.5)
WBC: 13.5 10*3/uL — ABNORMAL HIGH (ref 4.0–10.5)
nRBC: 0 % (ref 0.0–0.2)

## 2019-11-06 LAB — COMPREHENSIVE METABOLIC PANEL
ALT: 11 U/L (ref 0–44)
AST: 12 U/L — ABNORMAL LOW (ref 15–41)
Albumin: 2.8 g/dL — ABNORMAL LOW (ref 3.5–5.0)
Alkaline Phosphatase: 28 U/L — ABNORMAL LOW (ref 38–126)
Anion gap: 9 (ref 5–15)
BUN: 36 mg/dL — ABNORMAL HIGH (ref 8–23)
CO2: 23 mmol/L (ref 22–32)
Calcium: 8.1 mg/dL — ABNORMAL LOW (ref 8.9–10.3)
Chloride: 106 mmol/L (ref 98–111)
Creatinine, Ser: 0.73 mg/dL (ref 0.44–1.00)
GFR calc Af Amer: >60 mL/min (ref >60)
GFR calc non Af Amer: >60 mL/min (ref >60)
Glucose, Bld: 119 mg/dL — ABNORMAL HIGH (ref 70–99)
Potassium: 3.6 mmol/L (ref 3.5–5.1)
Sodium: 138 mmol/L (ref 135–145)
Total Bilirubin: 2 mg/dL — ABNORMAL HIGH (ref 0.3–1.2)
Total Protein: 5.1 g/dL — ABNORMAL LOW (ref 6.5–8.1)

## 2019-11-06 LAB — PHOSPHORUS: Phosphorus: 2.2 mg/dL — ABNORMAL LOW (ref 2.5–4.6)

## 2019-11-06 LAB — HEMOGLOBIN A1C
Hgb A1c MFr Bld: 4.9 % (ref 4.8–5.6)
Mean Plasma Glucose: 93.93 mg/dL

## 2019-11-06 LAB — MRSA PCR SCREENING: MRSA by PCR: NEGATIVE

## 2019-11-06 LAB — MAGNESIUM: Magnesium: 1.8 mg/dL (ref 1.7–2.4)

## 2019-11-06 MED ORDER — SODIUM PHOSPHATES 45 MMOLE/15ML IV SOLN
10.0000 mmol | Freq: Once | INTRAVENOUS | Status: AC
  Administered 2019-11-06: 10 mmol via INTRAVENOUS
  Filled 2019-11-06: qty 3.33

## 2019-11-06 MED ORDER — KCL IN DEXTROSE-NACL 20-5-0.45 MEQ/L-%-% IV SOLN: INTRAVENOUS | Status: DC

## 2019-11-06 NOTE — Progress Notes (Signed)
MEDICATION RELATED CONSULT NOTE - hypophosphatemia  Pharmacy Consult for  Indication: hypophosphetemia  Allergies  Allergen Reactions   Codeine Nausea And Vomiting   Tramadol Nausea And Vomiting   Sulfa Antibiotics Rash    Patient Measurements: Height: 5\' 7"  (170.2 cm) Weight: 65.7 kg (144 lb 13.5 oz) IBW/kg (Calculated) : 61.6 Adjusted Body Weight:  n/a  Vital Signs: Temp: 99.2 F (37.3 C) (08/15 0732) Temp Source: Axillary (08/15 0732) BP: 150/25 (08/15 0900) Pulse Rate: 80 (08/15 0900) Intake/Output from previous day: 08/14 0701 - 08/15 0700 In: 2912.7 [I.V.:2311.9; IV Piggyback:600.8] Out: 1100 [ZXYOF:1886; Blood:25] Intake/Output from this shift: No intake/output data recorded.  Labs: Recent Labs    11/05/19 1002 11/06/19 0539  WBC 20.9* 13.5*  HGB 14.4 11.8*  HCT 41.5 34.3*  PLT 268 216  CREATININE 1.19* 0.73  MG  --  1.8  PHOS  --  2.2*  ALBUMIN 3.9 2.8*  PROT 6.6 5.1*  AST 18 12*  ALT 12 11  ALKPHOS 39 28*  BILITOT 2.4* 2.0*   Estimated Creatinine Clearance: 44.5 mL/min (by C-G formula based on SCr of 0.73 mg/dL).   Assessment: This patient is one day post-op from small bowel repair/partial small bowel resection. Phos: 2.2 mg/dL  Na: 138 mmol/L K: 3.6-->pt has KCl in main IVF  Plan:  Infuse sodium phosphate 10 mmol in D5W 248mL over 6 hours x1 dose Re-check serum phosphate  in am   Despina Pole 11/06/2019,9:12 AM

## 2019-11-06 NOTE — Transfer of Care (Signed)
Immediate Anesthesia Transfer of Care Note  Patient: Laura Martin  Procedure(s) Performed: SMALL BOWEL REPAIR (N/A Abdomen)  Patient Location: ICU  Anesthesia Type:General  Level of Consciousness: awake  Airway & Oxygen Therapy: Patient Spontanous Breathing  Post-op Assessment: Report given to RN and Post -op Vital signs reviewed and stable  Post vital signs: stable  Last Vitals:  Vitals Value Taken Time  BP 142/59 11/06/19 1810  Temp 37 C 11/06/19 1810  Pulse 82 11/06/19 1810  Resp 16 11/06/19 1810  SpO2 95 % 11/06/19 1810    Last Pain:  Vitals:   11/06/19 1810  TempSrc: Oral  PainSc:       Patients Stated Pain Goal: 0 (12/87/86 7672)  Complications: No complications documented.

## 2019-11-06 NOTE — Progress Notes (Signed)
Patient seen and evaluated this morning.  She is postop day #1 status post partial small bowel resection.  She denies any significant complaints or concerns.  No acute overnight events noted.  Patient has been transferred to general surgical service at this time with no further need for IM consultation as she is stable from a medical standpoint.  Patient to be transferred to telemetry when bed available and IV fluids have been adjusted.  Diet has been advanced.  Please reconsult as needed.  Total care time: 15 minutes.

## 2019-11-06 NOTE — Anesthesia Postprocedure Evaluation (Signed)
Anesthesia Post Note  Patient: Laura Martin  Procedure(s) Performed: SMALL BOWEL REPAIR (N/A Abdomen)  Patient location during evaluation: Other Anesthesia Type: General Level of consciousness: awake Pain management: pain level controlled Vital Signs Assessment: post-procedure vital signs reviewed and stable Respiratory status: spontaneous breathing Cardiovascular status: blood pressure returned to baseline Anesthetic complications: no   No complications documented.   Last Vitals:  Vitals:   11/06/19 1354 11/06/19 1810  BP: (!) 128/55 (!) 142/59  Pulse: 85 82  Resp: 20 16  Temp: 36.7 C 37 C  SpO2: 96% 95%    Last Pain:  Vitals:   11/06/19 1810  TempSrc: Oral  PainSc:                  Louann Sjogren

## 2019-11-06 NOTE — Progress Notes (Signed)
1 Day Post-Op  Subjective: Patient denies any incisional pain.  Objective: Vital signs in last 24 hours: Temp:  [97.8 F (36.6 C)-99.2 F (37.3 C)] 99.2 F (37.3 C) (08/15 0732) Pulse Rate:  [34-102] 96 (08/15 0732) Resp:  [15-28] 20 (08/15 0732) BP: (109-215)/(35-123) 150/38 (08/15 0700) SpO2:  [91 %-100 %] 96 % (08/15 0732) Weight:  [60.8 kg-65.7 kg] 65.7 kg (08/14 2200) Last BM Date: 11/02/19  Intake/Output from previous day: 08/14 0701 - 08/15 0700 In: 2912.7 [I.V.:2311.9; IV Piggyback:600.8] Out: 1100 [Urine:1075; Blood:25] Intake/Output this shift: No intake/output data recorded.  General appearance: alert, cooperative and no distress Resp: clear to auscultation bilaterally Cardio: regular rate and rhythm, S1, S2 normal, no murmur, click, rub or gallop GI: Soft, incision healing well.  Minimal bowel sounds appreciated.  Lab Results:  Recent Labs    11/05/19 1002 11/06/19 0539  WBC 20.9* 13.5*  HGB 14.4 11.8*  HCT 41.5 34.3*  PLT 268 216   BMET Recent Labs    11/05/19 1002 11/06/19 0539  NA 135 138  K 4.0 3.6  CL 97* 106  CO2 26 23  GLUCOSE 181* 119*  BUN 53* 36*  CREATININE 1.19* 0.73  CALCIUM 9.4 8.1*   PT/INR No results for input(s): LABPROT, INR in the last 72 hours.  Studies/Results: CT ABDOMEN PELVIS W CONTRAST  Result Date: 11/05/2019 CLINICAL DATA:  Nausea and vomiting. EXAM: CT ABDOMEN AND PELVIS WITH CONTRAST TECHNIQUE: Multidetector CT imaging of the abdomen and pelvis was performed using the standard protocol following bolus administration of intravenous contrast. CONTRAST:  27mL OMNIPAQUE IOHEXOL 300 MG/ML  SOLN COMPARISON:  None. FINDINGS: Lower chest: Normal heart size. Coronary arterial vascular calcifications. Dependent atelectasis within the bilateral lower lobes. Incompletely visualized nodular opacities along the right minor fissure (image 1; series 4). Hepatobiliary: The liver is normal in size and contour. Stones in the gallbladder  lumen. Pancreas: Unremarkable Spleen: Unremarkable Adrenals/Urinary Tract: Normal adrenal glands. Kidneys enhance symmetrically with contrast. No hydronephrosis. Urinary bladder is distended. Stomach/Bowel: Descending and sigmoid colonic diverticulosis. No CT evidence for acute diverticulitis. Multiple fluid and gaseous distended loops of bowel are demonstrated within the central abdomen. Within the right hemiabdomen there is a twisted loop of small bowel which is dilated, thick-walled and has associated mesenteric edema. There appears to be 2 transition points along the proximal and distal margin of this abnormal appearing loop of small bowel, concerning for closed loop obstruction. The small bowel distal to this closed loop obstruction appears decompressed. No definite free intraperitoneal air. Vascular/Lymphatic: Normal caliber abdominal aorta. Peripheral calcified atherosclerotic plaque. No retroperitoneal lymphadenopathy. Reproductive: Uterus and adnexal structures are unremarkable. Other: Moderate volume free fluid in the pelvis. Musculoskeletal: No aggressive or acute appearing osseous lesions. Lower thoracic and lumbar spine degenerative changes. IMPRESSION: 1. There is a twisted loop of small bowel within the right hemiabdomen which is dilated, thick-walled and has associated mesenteric edema. There appears to be transition points along the proximal and distal margin of this abnormal appearing loop of small bowel, concerning for closed loop obstruction with associated vascular compromise. The small bowel proximal to this closed loop obstruction is dilated. The small bowel distal to the closed loop is decompressed 2. Moderate volume free fluid in the pelvis. 3. Cholelithiasis. 4. Incompletely visualized nodular opacities along the right minor fissure. Recommend follow-up chest CT in the nonacute setting for evaluation of the chest. 5. These results were called by telephone at the time of interpretation on  11/05/2019 at 12:23 pm  to provider JOSEPH ZAMMIT , who verbally acknowledged these results. Electronically Signed   By: Lovey Newcomer M.D.   On: 11/05/2019 12:25   DG Chest Port 1 View  Result Date: 11/05/2019 CLINICAL DATA:  Preop for surgery EXAM: PORTABLE CHEST 1 VIEW COMPARISON:  12/31/2013 FINDINGS: E There is mild bilateral interstitial prominence. No focal consolidation. No pleural effusion or pneumothorax. The heart mediastinum are stable. There is a dual lead cardiac pacemaker. No acute osseous abnormality. There is moderate arthropathy of bilateral AC joints. 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 IMPRESSION: No acute cardiopulmonary disease. Electronically Signed   By: Kathreen Devoid   On: 11/05/2019 14:02    Anti-infectives: Anti-infectives (From admission, onward)   Start     Dose/Rate Route Frequency Ordered Stop   11/05/19 2200  piperacillin-tazobactam (ZOSYN) IVPB 3.375 g     Discontinue     3.375 g 12.5 mL/hr over 240 Minutes Intravenous Every 8 hours 11/05/19 2155     11/05/19 1300  piperacillin-tazobactam (ZOSYN) IVPB 3.375 g        3.375 g 100 mL/hr over 30 Minutes Intravenous  Once 11/05/19 1258 11/05/19 1449      Assessment/Plan: s/p Procedure(s): SMALL BOWEL REPAIR Impression: Stable on postoperative day 1, status post partial small bowel resection.  Mild hypophosphatemia is noted and will be addressed.  Her BUN is decreasing.  Urine output has been good. We will remove Foley catheter.  Adjust IV fluids.  May transfer to regular floor with telemetry when bed available.  Have transferred patient to my service.  LOS: 1 day    Aviva Signs 11/06/2019

## 2019-11-07 ENCOUNTER — Encounter (HOSPITAL_COMMUNITY): Payer: Self-pay | Admitting: General Surgery

## 2019-11-07 LAB — CBC
HCT: 32.7 % — ABNORMAL LOW (ref 36.0–46.0)
Hemoglobin: 10.8 g/dL — ABNORMAL LOW (ref 12.0–15.0)
MCH: 31 pg (ref 26.0–34.0)
MCHC: 33 g/dL (ref 30.0–36.0)
MCV: 94 fL (ref 80.0–100.0)
Platelets: 201 10*3/uL (ref 150–400)
RBC: 3.48 MIL/uL — ABNORMAL LOW (ref 3.87–5.11)
RDW: 13.6 % (ref 11.5–15.5)
WBC: 9.3 10*3/uL (ref 4.0–10.5)
nRBC: 0 % (ref 0.0–0.2)

## 2019-11-07 LAB — BASIC METABOLIC PANEL
Anion gap: 6 (ref 5–15)
BUN: 20 mg/dL (ref 8–23)
CO2: 25 mmol/L (ref 22–32)
Calcium: 8.3 mg/dL — ABNORMAL LOW (ref 8.9–10.3)
Chloride: 105 mmol/L (ref 98–111)
Creatinine, Ser: 0.69 mg/dL (ref 0.44–1.00)
GFR calc Af Amer: >60 mL/min (ref >60)
GFR calc non Af Amer: >60 mL/min (ref >60)
Glucose, Bld: 106 mg/dL — ABNORMAL HIGH (ref 70–99)
Potassium: 4.4 mmol/L (ref 3.5–5.1)
Sodium: 136 mmol/L (ref 135–145)

## 2019-11-07 LAB — MAGNESIUM: Magnesium: 1.8 mg/dL (ref 1.7–2.4)

## 2019-11-07 LAB — PHOSPHORUS: Phosphorus: 1.8 mg/dL — ABNORMAL LOW (ref 2.5–4.6)

## 2019-11-07 MED ORDER — SODIUM PHOSPHATES 45 MMOLE/15ML IV SOLN
15.0000 mmol | Freq: Once | INTRAVENOUS | Status: AC
  Administered 2019-11-07: 15 mmol via INTRAVENOUS
  Filled 2019-11-07: qty 5

## 2019-11-07 NOTE — Care Management Important Message (Signed)
Important Message  Patient Details  Name: Laura Martin MRN: [id number] Date of Birth: 04-28-1925   Medicare Important Message Given:  Yes     Tommy Medal 11/07/2019, 3:09 PM

## 2019-11-07 NOTE — Progress Notes (Signed)
MEDICATION RELATED CONSULT NOTE - hypophosphatemia  Pharmacy Consult for  Indication: hypophosphetemia  Allergies  Allergen Reactions  . Codeine Nausea And Vomiting  . Tramadol Nausea And Vomiting  . Sulfa Antibiotics Rash    Patient Measurements: Height: 5\' 7"  (170.2 cm) Weight: 65.7 kg (144 lb 13.5 oz) IBW/kg (Calculated) : 61.6 Adjusted Body Weight:  n/a  Vital Signs: Temp: 98.1 F (36.7 C) (08/16 0604) Temp Source: Oral (08/16 0604) BP: 158/70 (08/16 0604) Pulse Rate: 79 (08/16 0604) Intake/Output from previous day: 08/15 0701 - 08/16 0700 In: 473.3 [I.V.:250.9; IV Piggyback:222.5] Out: 1100 [Urine:1100] Intake/Output from this shift: No intake/output data recorded.  Labs: Recent Labs    11/05/19 1002 11/06/19 0539 11/07/19 0526  WBC 20.9* 13.5* 9.3  HGB 14.4 11.8* 10.8*  HCT 41.5 34.3* 32.7*  PLT 268 216 201  CREATININE 1.19* 0.73 0.69  MG  --  1.8 1.8  PHOS  --  2.2* 1.8*  ALBUMIN 3.9 2.8*  --   PROT 6.6 5.1*  --   AST 18 12*  --   ALT 12 11  --   ALKPHOS 39 28*  --   BILITOT 2.4* 2.0*  --    Estimated Creatinine Clearance: 44.5 mL/min (by C-G formula based on SCr of 0.69 mg/dL).   Assessment: This patient is POD #2 from small bowel repair/partial small bowel resection. Phos: 2.2>1.8  Na: 136  mmol/L K: 4.4-->pt has KCl in main IVF  Plan:  Infuse sodium phosphate 15 mmol in D5W 233mL over 6 hours x1 dose Re-check serum phosphate  in am   Despina Pole 11/07/2019,11:08 AM

## 2019-11-07 NOTE — TOC Initial Note (Signed)
Transition of Care [facility]) - Initial/Assessment Note    Patient Details  Name: Laura Martin MRN: [id number] Date of Birth: 12-29-1925  Transition [facility]) CM/SW Contact:    Boneta Lucks, RN Phone Number: 11/07/2019, 11:06 AM  Clinical Narrative:       Patient admitted with Small bowel obstruction. S/P surgery, son at the bedside. Patient and Son is agreeing to Home health RN to check patient progress and incision at home.  Linda with [facility] accepted the referral.             Expected Discharge Plan: Home/Self Care Barriers to Discharge: Continued Medical Work up   Patient Goals and CMS Choice Patient states their goals for this hospitalization and ongoing recovery are:: to go home. CMS Medicare.gov Compare Post Acute Care list provided to:: Patient Represenative (must comment) Choice offered to / list presented to : Adult Children  Expected Discharge Plan and Services Expected Discharge Plan: Home/Self Care       Living arrangements for the past 2 months: [place]: RN [place] Agency: [place] ([place]) Date [place]: 11/07/19 Time [place]: 1106 Representative spoke with at [place]: [name]  Prior Living Arrangements/Services Living arrangements for the past 2 months: [place] Lives with:: Spouse Patient language and need for interpreter reviewed:: Yes Do you feel safe going back to the place where you live?: No      Need for Family Participation in Patient Care: Yes (Comment) Care giver support system in place?: Yes (comment)   Criminal Activity/Legal Involvement Pertinent to Current Situation/Hospitalization: No - Comment as needed  Activities of Daily Living Home Assistive Devices/Equipment: Environmental consultant (specify type), Cane (specify quad or straight), Blood pressure cuff ADL Screening (condition at time of admission) Patient's cognitive ability adequate to safely  complete daily activities?: Yes Is the patient deaf or have difficulty hearing?: Yes Does the patient have difficulty seeing, even when wearing glasses/contacts?: No Does the patient have difficulty concentrating, remembering, or making decisions?: No Patient able to express need for assistance with ADLs?: Yes Does the patient have difficulty dressing or bathing?: No Independently performs ADLs?: Yes (appropriate for developmental age) Does the patient have difficulty walking or climbing stairs?: Yes Weakness of Legs: Both Weakness of Arms/Hands: None  Permission Sought/Granted        Emotional Assessment     Affect (typically observed): Accepting Orientation: : Oriented to Self, Oriented to Place, Oriented to  Time, Oriented to Situation Alcohol / Substance Use: Not Applicable Psych Involvement: No (comment)  Admission diagnosis:  Small bowel obstruction (HCC) [K56.609] Pain of upper abdomen [R10.10] SBO (small bowel obstruction) ([place]) [K56.609] Patient Active Problem List   Diagnosis Date Noted  . Small bowel obstruction ([place]) 11/05/2019  . SBO (small bowel obstruction) ([place]) 11/05/2019  . Lack of intravenous access   . Intestinal adhesions with complete obstruction ([place])   . Elevated glucose 04/21/2019  . Anemia 04/21/2019  . Osteoporosis 04/21/2019  . Pacemaker 05/12/2014  . Complete heart block ([place]) 01/01/2014  . Mobitz type 2 second degree AV block 12/30/2013  . Heart murmur 11/11/2013  . Essential hypertension, benign 05/27/2011  . Mixed hyperlipidemia 05/27/2011   PCP:  Celene Squibb, MD Pharmacy:   [place], [place] - [address] [place] [address] Phone:  [phone] Fax: [phone]

## 2019-11-07 NOTE — Progress Notes (Signed)
2 Days Post-Op  Subjective: Patient has no complaints.  Has not had a bowel movement yet.  Objective: Vital signs in last 24 hours: Temp:  [98.1 F (36.7 C)-98.6 F (37 C)] 98.1 F (36.7 C) (08/16 0604) Pulse Rate:  [77-85] 79 (08/16 0604) Resp:  [16-24] 16 (08/15 1810) BP: (128-163)/(40-70) 158/70 (08/16 0604) SpO2:  [92 %-96 %] 96 % (08/16 0604) Last BM Date: 11/02/19  Intake/Output from previous day: 08/15 0701 - 08/16 0700 In: 473.3 [I.V.:250.9; IV Piggyback:222.5] Out: 1100 [Urine:1100] Intake/Output this shift: No intake/output data recorded.  General appearance: alert, cooperative and no distress Resp: clear to auscultation bilaterally Cardio: regular rate and rhythm, S1, S2 normal, no murmur, click, rub or gallop GI: Soft, incision healing well.  Occasional bowel sounds appreciated.  Lab Results:  Recent Labs    11/06/19 0539 11/07/19 0526  WBC 13.5* 9.3  HGB 11.8* 10.8*  HCT 34.3* 32.7*  PLT 216 201   BMET Recent Labs    11/06/19 0539 11/07/19 0526  NA 138 136  K 3.6 4.4  CL 106 105  CO2 23 25  GLUCOSE 119* 106*  BUN 36* 20  CREATININE 0.73 0.69  CALCIUM 8.1* 8.3*   PT/INR No results for input(s): LABPROT, INR in the last 72 hours.  Studies/Results: CT ABDOMEN PELVIS W CONTRAST  Result Date: 11/05/2019 CLINICAL DATA:  Nausea and vomiting. EXAM: CT ABDOMEN AND PELVIS WITH CONTRAST TECHNIQUE: Multidetector CT imaging of the abdomen and pelvis was performed using the standard protocol following bolus administration of intravenous contrast. CONTRAST:  24mL OMNIPAQUE IOHEXOL 300 MG/ML  SOLN COMPARISON:  None. FINDINGS: Lower chest: Normal heart size. Coronary arterial vascular calcifications. Dependent atelectasis within the bilateral lower lobes. Incompletely visualized nodular opacities along the right minor fissure (image 1; series 4). Hepatobiliary: The liver is normal in size and contour. Stones in the gallbladder lumen. Pancreas: Unremarkable  Spleen: Unremarkable Adrenals/Urinary Tract: Normal adrenal glands. Kidneys enhance symmetrically with contrast. No hydronephrosis. Urinary bladder is distended. Stomach/Bowel: Descending and sigmoid colonic diverticulosis. No CT evidence for acute diverticulitis. Multiple fluid and gaseous distended loops of bowel are demonstrated within the central abdomen. Within the right hemiabdomen there is a twisted loop of small bowel which is dilated, thick-walled and has associated mesenteric edema. There appears to be 2 transition points along the proximal and distal margin of this abnormal appearing loop of small bowel, concerning for closed loop obstruction. The small bowel distal to this closed loop obstruction appears decompressed. No definite free intraperitoneal air. Vascular/Lymphatic: Normal caliber abdominal aorta. Peripheral calcified atherosclerotic plaque. No retroperitoneal lymphadenopathy. Reproductive: Uterus and adnexal structures are unremarkable. Other: Moderate volume free fluid in the pelvis. Musculoskeletal: No aggressive or acute appearing osseous lesions. Lower thoracic and lumbar spine degenerative changes. IMPRESSION: 1. There is a twisted loop of small bowel within the right hemiabdomen which is dilated, thick-walled and has associated mesenteric edema. There appears to be transition points along the proximal and distal margin of this abnormal appearing loop of small bowel, concerning for closed loop obstruction with associated vascular compromise. The small bowel proximal to this closed loop obstruction is dilated. The small bowel distal to the closed loop is decompressed 2. Moderate volume free fluid in the pelvis. 3. Cholelithiasis. 4. Incompletely visualized nodular opacities along the right minor fissure. Recommend follow-up chest CT in the nonacute setting for evaluation of the chest. 5. These results were called by telephone at the time of interpretation on 11/05/2019 at 12:23 pm to  provider [facility]  ZAMMIT , who verbally acknowledged these results. Electronically Signed   By: Lovey Newcomer M.D.   On: 11/05/2019 12:25   DG Chest Port 1 View  Result Date: 11/05/2019 CLINICAL DATA:  Preop for surgery EXAM: PORTABLE CHEST 1 VIEW COMPARISON:  12/31/2013 FINDINGS: E There is mild bilateral interstitial prominence. No focal consolidation. No pleural effusion or pneumothorax. The heart mediastinum are stable. There is a dual lead cardiac pacemaker. No acute osseous abnormality. There is moderate arthropathy of bilateral AC joints. 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 IMPRESSION: No acute cardiopulmonary disease. Electronically Signed   By: Kathreen Devoid   On: 11/05/2019 14:02    Anti-infectives: Anti-infectives (From admission, onward)   Start     Dose/Rate Route Frequency Ordered Stop   11/05/19 2200  piperacillin-tazobactam (ZOSYN) IVPB 3.375 g     Discontinue     3.375 g 12.5 mL/hr over 240 Minutes Intravenous Every 8 hours 11/05/19 2155     11/05/19 1300  piperacillin-tazobactam (ZOSYN) IVPB 3.375 g        3.375 g 100 mL/hr over 30 Minutes Intravenous  Once 11/05/19 1258 11/05/19 1449      Assessment/Plan: s/p Procedure(s): SMALL BOWEL REPAIR Impression: Stable on postoperative day 2.  Bowel function is starting to return.  Still with hypophosphatemia which is being addressed by pharmacy.  Continue full liquid diet for now.  BUN has normalized.  LOS: 2 days    Aviva Signs 11/07/2019

## 2019-11-08 LAB — CBC
HCT: 31.3 % — ABNORMAL LOW (ref 36.0–46.0)
Hemoglobin: 10.5 g/dL — ABNORMAL LOW (ref 12.0–15.0)
MCH: 30.7 pg (ref 26.0–34.0)
MCHC: 33.5 g/dL (ref 30.0–36.0)
MCV: 91.5 fL (ref 80.0–100.0)
Platelets: 214 10*3/uL (ref 150–400)
RBC: 3.42 MIL/uL — ABNORMAL LOW (ref 3.87–5.11)
RDW: 13.2 % (ref 11.5–15.5)
WBC: 8.8 10*3/uL (ref 4.0–10.5)
nRBC: 0 % (ref 0.0–0.2)

## 2019-11-08 LAB — SURGICAL PATHOLOGY

## 2019-11-08 LAB — BASIC METABOLIC PANEL
Anion gap: 8 (ref 5–15)
BUN: 14 mg/dL (ref 8–23)
CO2: 25 mmol/L (ref 22–32)
Calcium: 8.2 mg/dL — ABNORMAL LOW (ref 8.9–10.3)
Chloride: 103 mmol/L (ref 98–111)
Creatinine, Ser: 0.58 mg/dL (ref 0.44–1.00)
GFR calc Af Amer: >60 mL/min (ref >60)
GFR calc non Af Amer: >60 mL/min (ref >60)
Glucose, Bld: 114 mg/dL — ABNORMAL HIGH (ref 70–99)
Potassium: 4.3 mmol/L (ref 3.5–5.1)
Sodium: 136 mmol/L (ref 135–145)

## 2019-11-08 LAB — PHOSPHORUS: Phosphorus: 2.9 mg/dL (ref 2.5–4.6)

## 2019-11-08 MED ORDER — MAGNESIUM HYDROXIDE 400 MG/5ML PO SUSP
30.0000 mL | Freq: Three times a day (TID) | ORAL | Status: DC | PRN
  Administered 2019-11-08: 30 mL via ORAL
  Filled 2019-11-08: qty 30

## 2019-11-08 MED ORDER — ENOXAPARIN SODIUM 40 MG/0.4ML ~~LOC~~ SOLN
40.0000 mg | SUBCUTANEOUS | Status: DC
  Administered 2019-11-09: 40 mg via SUBCUTANEOUS
  Filled 2019-11-08: qty 0.4

## 2019-11-08 MED ORDER — SILVER SULFADIAZINE 1 % EX CREA
TOPICAL_CREAM | CUTANEOUS | Status: AC
  Filled 2019-11-08: qty 50

## 2019-11-08 NOTE — Progress Notes (Signed)
3 Days Post-Op  Subjective: Patient has no complaints.  Passing flatus.  No bowel movement yet.  Objective: Vital signs in last 24 hours: Temp:  [97.9 F (36.6 C)-99.1 F (37.3 C)] 99.1 F (37.3 C) (08/17 0509) Pulse Rate:  [54-79] 70 (08/17 0509) Resp:  [18] 18 (08/16 1339) BP: (130-164)/(54-92) 156/56 (08/17 0509) SpO2:  [92 %-98 %] 95 % (08/17 0509) Last BM Date: 11/02/19  Intake/Output from previous day: 08/16 0701 - 08/17 0700 In: 531.6 [P.O.:360; IV Piggyback:171.6] Out: -  Intake/Output this shift: No intake/output data recorded.  General appearance: alert, cooperative and no distress Resp: clear to auscultation bilaterally Cardio: regular rate and rhythm, S1, S2 normal, no murmur, click, rub or gallop GI: Soft, incision healing well.  Bowel sounds active.  Lab Results:  Recent Labs    11/07/19 0526 11/08/19 0429  WBC 9.3 8.8  HGB 10.8* 10.5*  HCT 32.7* 31.3*  PLT 201 214   BMET Recent Labs    11/07/19 0526 11/08/19 0429  NA 136 136  K 4.4 4.3  CL 105 103  CO2 25 25  GLUCOSE 106* 114*  BUN 20 14  CREATININE 0.69 0.58  CALCIUM 8.3* 8.2*   PT/INR No results for input(s): LABPROT, INR in the last 72 hours.  Studies/Results: No results found.  Anti-infectives: Anti-infectives (From admission, onward)   Start     Dose/Rate Route Frequency Ordered Stop   11/05/19 2200  piperacillin-tazobactam (ZOSYN) IVPB 3.375 g     Discontinue     3.375 g 12.5 mL/hr over 240 Minutes Intravenous Every 8 hours 11/05/19 2155     11/05/19 1300  piperacillin-tazobactam (ZOSYN) IVPB 3.375 g        3.375 g 100 mL/hr over 30 Minutes Intravenous  Once 11/05/19 1258 11/05/19 1449      Assessment/Plan: s/p Procedure(s): SMALL BOWEL REPAIR Imp: Postoperative day 3, status post partial small bowel resection.  Patient is passing flatus but has not had a bowel movement yet.  She is tolerating full liquid diet well.  Her hypophosphatemia has resolved.  Awaiting full return  of bowel function.  Anticipate discharge in next 24 to 48 hours.  LOS: 3 days    Aviva Signs 11/08/2019

## 2019-11-08 NOTE — Progress Notes (Signed)
MEDICATION RELATED CONSULT NOTE - hypophosphatemia  Pharmacy Consult for  Indication: hypophosphetemia  Allergies  Allergen Reactions  . Codeine Nausea And Vomiting  . Tramadol Nausea And Vomiting  . Sulfa Antibiotics Rash    Patient Measurements: Height: 5\' 7"  (170.2 cm) Weight: 65.7 kg (144 lb 13.5 oz) IBW/kg (Calculated) : 61.6 Adjusted Body Weight:  n/a  Vital Signs: Temp: 99.1 F (37.3 C) (08/17 0509) Temp Source: Oral (08/17 0509) BP: 156/56 (08/17 0509) Pulse Rate: 70 (08/17 0509) Intake/Output from previous day: 08/16 0701 - 08/17 0700 In: 531.6 [P.O.:360; IV Piggyback:171.6] Out: -  Intake/Output from this shift: No intake/output data recorded.  Labs: Recent Labs    11/05/19 1002 11/05/19 1002 11/06/19 0539 11/07/19 0526 11/08/19 0429  WBC 20.9*   < > 13.5* 9.3 8.8  HGB 14.4   < > 11.8* 10.8* 10.5*  HCT 41.5   < > 34.3* 32.7* 31.3*  PLT 268   < > 216 201 214  CREATININE 1.19*   < > 0.73 0.69 0.58  MG  --   --  1.8 1.8  --   PHOS  --   --  2.2* 1.8* 2.9  ALBUMIN 3.9  --  2.8*  --   --   PROT 6.6  --  5.1*  --   --   AST 18  --  12*  --   --   ALT 12  --  11  --   --   ALKPHOS 39  --  28*  --   --   BILITOT 2.4*  --  2.0*  --   --    < > = values in this interval not displayed.   Estimated Creatinine Clearance: 44.5 mL/min (by C-G formula based on SCr of 0.58 mg/dL).   Assessment: This patient is POD #3 from small bowel repair/partial small bowel resection. Phos: 2.2>1.8>2.9  Na: 136  mmol/L K: 4.3-->pt has KCl in main IVF  Plan:  Re-check serum phosphate  in am   Laura Martin 11/08/2019,7:14 AM

## 2019-11-09 NOTE — Progress Notes (Signed)
Nsg Discharge Note  Admit Date:  11/05/2019 Discharge date: 11/09/2019   [place] to be D/C'd Home per MD order.  AVS completed.  Copy for chart, and copy for patient signed, and dated. Patient/caregiver able to verbalize understanding.  Discharge Medication: Allergies as of 11/09/2019      Reactions   Codeine Nausea And Vomiting   Tramadol Nausea And Vomiting   Sulfa Antibiotics Rash      Medication List    STOP taking these medications   doxycycline 100 MG capsule Commonly known as: VIBRAMYCIN     TAKE these medications   acetaminophen 500 MG tablet Commonly known as: TYLENOL Take 500 mg by mouth every 6 (six) hours as needed for mild pain.   amLODipine 5 MG tablet Commonly known as: NORVASC Take 5 mg by mouth daily.   CALCIUM 600 PO Take 600 mg by mouth daily.   cholecalciferol 10 MCG (400 UNIT) Tabs tablet Commonly known as: VITAMIN D3 Take 400 Units by mouth.   Co Q 10 100 MG Caps Take 100 mg by mouth daily.   diphenhydrAMINE 25 MG tablet Commonly known as: BENADRYL Take 25 mg by mouth at bedtime.   fish oil-omega-3 fatty acids 1000 MG capsule Take 1 g by mouth daily.       Discharge Assessment: Vitals:   11/09/19 0450 11/09/19 0904  BP: (!) 146/58   Pulse: 66   Resp: 19   Temp: 98.1 F (36.7 C)   SpO2: 96% 94%   Skin clean, dry and intact without evidence of skin break down, no evidence of skin tears noted. IV catheter discontinued intact. Site without signs and symptoms of complications - no redness or edema noted at insertion site, patient denies c/o pain - only slight tenderness at site.  Dressing with slight pressure applied.  D/c Instructions-Education: Discharge instructions given to patient/family with verbalized understanding. D/c education completed with patient/family including follow up instructions, medication list, d/c activities limitations if indicated, with other d/c instructions as indicated by MD - patient able to verbalize  understanding, all questions fully answered. Patient instructed to return to ED, call 911, or call MD for any changes in condition.  Patient escorted via [place], and D/C home via private auto.  Dorcas Mcmurray, LPN 04/23/8655 84:69 AM

## 2019-11-09 NOTE — Progress Notes (Signed)
CVC to right femora area removed. Catheter intact. Dressing applied, patient tolerated well. Patient educated to watch for bleeding, will continue to monitor.

## 2019-11-09 NOTE — Discharge Summary (Signed)
Physician Discharge Summary  Patient ID: Laura Martin [facility] MRN: [id number] DOB/AGE: 84-Jan-1927 84 y.o.  Admit date: 11/05/2019 Discharge date: 11/09/2019  Admission Diagnoses: Small bowel obstruction  Discharge Diagnoses: Small bowel obstruction secondary to closed loop adhesion, ischemic bowel Active Problems:   Essential hypertension, benign   Mixed hyperlipidemia   Complete heart block (HCC)   Small bowel obstruction (HCC)   Lack of intravenous access   Intestinal adhesions with complete obstruction (HCC)   SBO (small bowel obstruction) ([place])   Discharged Condition: good  Hospital Course: Patient is a 86 year old white female who presented to the emergency room with a 3-day history of worsening nausea, vomiting, and abdominal pain.  CT scan of the abdomen revealed a closed-loop obstruction with possible edematous bowel.  The patient was taken to the operating room on 11/05/2019 and underwent exploratory laparotomy, lysis of adhesion, and partial small bowel resection.  She tolerated the surgery well.  Her postoperative course was for the most part unremarkable.  She was noted to be azotemic and this resolved with fluid hydration.  She was also noted to be hypophosphatemic and this was addressed.  Her diet was advanced out difficulty once her bowel function returned.  She is being discharged home on 11/09/2019 in good and improving condition with home health to check on her.  Treatments: surgery: Exploratory laparotomy, lysis of adhesions, partial small bowel resection on 11/05/2019  Discharge Exam: Blood pressure (!) 146/58, pulse 66, temperature 98.1 F (36.7 C), temperature source Oral, resp. rate 19, height 5\' 7"  (1.702 m), weight 65.7 kg, SpO2 94 %. General appearance: alert, cooperative and no distress Resp: clear to auscultation bilaterally Cardio: regular rate and rhythm, S1, S2 normal, no murmur, click, rub or gallop GI: Soft, incision healing well.  Bowel sounds  present.  Disposition: Discharge disposition: 01-Home or Self Care       Discharge Instructions    Diet - low sodium heart healthy   Complete by: As directed    Increase activity slowly   Complete by: As directed      Allergies as of 11/09/2019      Reactions   Codeine Nausea And Vomiting   Tramadol Nausea And Vomiting   Sulfa Antibiotics Rash      Medication List    STOP taking these medications   doxycycline 100 MG capsule Commonly known as: VIBRAMYCIN     TAKE these medications   acetaminophen 500 MG tablet Commonly known as: TYLENOL Take 500 mg by mouth every 6 (six) hours as needed for mild pain.   amLODipine 5 MG tablet Commonly known as: NORVASC Take 5 mg by mouth daily.   CALCIUM 600 PO Take 600 mg by mouth daily.   cholecalciferol 10 MCG (400 UNIT) Tabs tablet Commonly known as: VITAMIN D3 Take 400 Units by mouth.   Co Q 10 100 MG Caps Take 100 mg by mouth daily.   diphenhydrAMINE 25 MG tablet Commonly known as: BENADRYL Take 25 mg by mouth at bedtime.   fish oil-omega-3 fatty acids 1000 MG capsule Take 1 g by mouth daily.       Follow-up Information    [facility].   Specialty: Home Health Services Why:   RN - they will call you within 48 hours of discharge       Aviva Signs, MD. Schedule an appointment as soon as possible for a visit on 11/17/2019.   Specialty: General Surgery Contact information: [id number] [id number]  [phone]               Signed: Aviva Signs 11/09/2019, 10:25 AM

## 2019-11-09 NOTE — Discharge Instructions (Signed)
Exploratory Laparotomy, Adult, Care After °This sheet gives you information about how to care for yourself after your procedure. Your health care provider may also give you more specific instructions. If you have problems or questions, contact your health care provider. °What can I expect after the procedure? °After the procedure, it is common to have: °· Abdominal soreness. °· Fatigue. °· A sore throat from the tube in your throat. °· A lack of appetite. °Follow these instructions at home: °Medicines °· Take over-the-counter and prescription medicines only as told by your health care provider. °· If you were prescribed an antibiotic medicine, take it as told by your health care provider. Do not stop taking the antibiotic even if you start to feel better. °· Do not drive or operate heavy machinery while taking pain medicine. °· If you are taking prescription pain medicine, take actions to prevent or treat constipation. Your health care provider may recommend that you: °? Drink enough fluid to keep your urine pale yellow. °? Eat foods that are high in fiber, such as fresh fruits and vegetables, whole grains, and beans. °? Limit foods that are high in fat and processed sugars, such as fried or sweet foods. °? Take an over-the-counter or prescription medicine for constipation. Undergoing surgery and taking pain medicines can make constipation worse. °Incision care ° °· Follow instructions from your health care provider about how to take care of your incision. Make sure you: °? Wash your hands with soap and water before you change your bandage (dressing). If soap and water are not available, use hand sanitizer. °? Change your dressing as told by your health care provider. °? Leave stitches (sutures), skin glue, or adhesive strips in place. These skin closures may need to stay in place for 2 weeks or longer. If adhesive strip edges start to loosen and curl up, you may trim the loose edges. Do not remove adhesive strips  completely unless your health care provider tells you to do that. °· If you were sent home with a drain, follow instructions from your health care provider about how to care for it. °· Check your incision area every day for signs of infection. Check for: °? Redness, swelling, or pain. °? Fluid or blood. °? Warmth. °? Pus or a bad smell. °Activity ° °· Rest as told by your health care provider. °? Avoid sitting for a long time without moving. Get up to take short walks every 1-2 hours. This is important to improve blood flow and breathing. Ask for help if you feel weak or unsteady. °· Do not lift anything that is heavier than 5 lb (2.2 kg), or the limit that your health care provider tells you, until he or she says that it is safe. °· Ask your health care provider when you can start to do your usual activities again, such as driving, going back to work, and having sex. °Eating and drinking °· You may eat what you usually eat. Include lots of whole grains, fruits, and vegetables in your diet. This will help to prevent constipation. °· Drink enough fluid to keep your urine pale yellow. °Bathing °· Keep your incision clean and dry. Clean it as often as told by your health care provider: °? Gently wash the incision with soap and water. °? Rinse the incision with water to remove all soap. °? Pat the incision dry with a clean towel. Do not rub the incision. °· You may take showers after 48 hours. °· Do not take baths,   swim, or use a hot tub until your health care provider says it is okay to do so. °General instructions °· Do not use any products that contain nicotine or tobacco, such as cigarettes and e-cigarettes. These can delay healing after surgery. If you need help quitting, ask your health care provider. °· Wear compression stockings as told by your health care provider. These stockings help to prevent blood clots and reduce swelling in your legs. °· Keep all follow-up visits as told by your health care provider.  This is important. °Contact a health care provider if: °· You have a fever. °· You have chills. °· Your pain medicine is not helping. °· You have constipation or diarrhea. °· You have nausea or vomiting. °· You have drainage, redness, swelling, or pain at your incision site. °Get help right away if: °· Your pain is getting worse. °· You have not had a bowel movement for more than 3 days. °· You have ongoing (persistent) vomiting. °· The edges of your incision open up. °· You have warmth, tenderness, and swelling in your calf. °· You have trouble breathing. °· You have chest pain. °These symptoms may represent a serious problem that is an emergency. Do not wait to see if the symptoms will go away. Get medical help right away. Call your local emergency services (911 in the [place]). Do not drive yourself to the hospital. °Summary °· Abdominal soreness is common after exploratory laparotomy. Take over-the-counter and prescription pain medicines only as told by your health care provider. °· Follow instructions from your health care provider about how to take care of your incision. Do not take baths, swim, or use a hot tub until your health care provider says it is okay to do so. °· Watch for signs and symptoms of infection after surgery, including fever, chills, drainage from your incision, and worsening abdominal pain. °This information is not intended to replace advice given to you by your health care provider. Make sure you discuss any questions you have with your health care provider. °Document Revised: 05/03/2018 Document Reviewed: 03/20/2017 °Elsevier Patient Education © 2020 [employer] ° °

## 2019-11-11 DIAGNOSIS — K5652 Intestinal adhesions [bands] with complete obstruction: Secondary | ICD-10-CM | POA: Diagnosis not present

## 2019-11-11 DIAGNOSIS — S91002D Unspecified open wound, left ankle, subsequent encounter: Secondary | ICD-10-CM | POA: Diagnosis not present

## 2019-11-11 DIAGNOSIS — M4800 Spinal stenosis, site unspecified: Secondary | ICD-10-CM | POA: Diagnosis not present

## 2019-11-11 DIAGNOSIS — E782 Mixed hyperlipidemia: Secondary | ICD-10-CM | POA: Diagnosis not present

## 2019-11-11 DIAGNOSIS — R739 Hyperglycemia, unspecified: Secondary | ICD-10-CM | POA: Diagnosis not present

## 2019-11-11 DIAGNOSIS — Z9181 History of falling: Secondary | ICD-10-CM | POA: Diagnosis not present

## 2019-11-11 DIAGNOSIS — Z950 Presence of cardiac pacemaker: Secondary | ICD-10-CM | POA: Diagnosis not present

## 2019-11-11 DIAGNOSIS — Z48815 Encounter for surgical aftercare following surgery on the digestive system: Secondary | ICD-10-CM | POA: Diagnosis not present

## 2019-11-11 DIAGNOSIS — I442 Atrioventricular block, complete: Secondary | ICD-10-CM | POA: Diagnosis not present

## 2019-11-11 DIAGNOSIS — I10 Essential (primary) hypertension: Secondary | ICD-10-CM | POA: Diagnosis not present

## 2019-11-11 DIAGNOSIS — Z8719 Personal history of other diseases of the digestive system: Secondary | ICD-10-CM | POA: Diagnosis not present

## 2019-11-11 DIAGNOSIS — E041 Nontoxic single thyroid nodule: Secondary | ICD-10-CM | POA: Diagnosis not present

## 2019-11-17 ENCOUNTER — Encounter: Payer: Self-pay | Admitting: General Surgery

## 2019-11-17 ENCOUNTER — Ambulatory Visit (INDEPENDENT_AMBULATORY_CARE_PROVIDER_SITE_OTHER): Payer: Medicare Other | Admitting: General Surgery

## 2019-11-17 ENCOUNTER — Other Ambulatory Visit: Payer: Self-pay

## 2019-11-17 VITALS — BP 148/65 | HR 76 | Temp 97.7°F | Resp 12 | Ht 67.0 in | Wt 127.6 lb

## 2019-11-17 DIAGNOSIS — I442 Atrioventricular block, complete: Secondary | ICD-10-CM | POA: Diagnosis not present

## 2019-11-17 DIAGNOSIS — S91002D Unspecified open wound, left ankle, subsequent encounter: Secondary | ICD-10-CM | POA: Diagnosis not present

## 2019-11-17 DIAGNOSIS — M4800 Spinal stenosis, site unspecified: Secondary | ICD-10-CM | POA: Diagnosis not present

## 2019-11-17 DIAGNOSIS — E782 Mixed hyperlipidemia: Secondary | ICD-10-CM | POA: Diagnosis not present

## 2019-11-17 DIAGNOSIS — Z09 Encounter for follow-up examination after completed treatment for conditions other than malignant neoplasm: Secondary | ICD-10-CM

## 2019-11-17 DIAGNOSIS — Z48815 Encounter for surgical aftercare following surgery on the digestive system: Secondary | ICD-10-CM | POA: Diagnosis not present

## 2019-11-17 DIAGNOSIS — I10 Essential (primary) hypertension: Secondary | ICD-10-CM | POA: Diagnosis not present

## 2019-11-17 NOTE — Progress Notes (Signed)
Subjective:     Laura Martin  Here for postop visit s/p partial small bowel resection.  Patient doing very well.  No nausea, vomiting.  Bowel movements within normal limits.  Has been belching a little more recently.  Appetite has returned to normal. Objective:    BP (!) 148/65   Pulse 76   Temp 97.7 F (36.5 C) (Oral)   Resp 12   Ht 5\' 7"  (1.702 m)   Wt 127 lb 9.6 oz (57.9 kg)   SpO2 97%   BMI 19.98 kg/m   General:  alert, cooperative and no distress  Abdomen soft, nontender.  Incision healing well.  Staples removed, steristrips applied. Final path consistent with diagnosis     Assessment:    Doing well postoperatively.    Plan:  May use Gas X as needed.  Follow up here prn.

## 2019-11-25 DIAGNOSIS — M4800 Spinal stenosis, site unspecified: Secondary | ICD-10-CM | POA: Diagnosis not present

## 2019-11-25 DIAGNOSIS — I10 Essential (primary) hypertension: Secondary | ICD-10-CM | POA: Diagnosis not present

## 2019-11-25 DIAGNOSIS — I442 Atrioventricular block, complete: Secondary | ICD-10-CM | POA: Diagnosis not present

## 2019-11-25 DIAGNOSIS — S91002D Unspecified open wound, left ankle, subsequent encounter: Secondary | ICD-10-CM | POA: Diagnosis not present

## 2019-11-25 DIAGNOSIS — Z48815 Encounter for surgical aftercare following surgery on the digestive system: Secondary | ICD-10-CM | POA: Diagnosis not present

## 2019-11-25 DIAGNOSIS — E782 Mixed hyperlipidemia: Secondary | ICD-10-CM | POA: Diagnosis not present

## 2019-12-01 DIAGNOSIS — S91002D Unspecified open wound, left ankle, subsequent encounter: Secondary | ICD-10-CM | POA: Diagnosis not present

## 2019-12-01 DIAGNOSIS — E782 Mixed hyperlipidemia: Secondary | ICD-10-CM | POA: Diagnosis not present

## 2019-12-01 DIAGNOSIS — I10 Essential (primary) hypertension: Secondary | ICD-10-CM | POA: Diagnosis not present

## 2019-12-01 DIAGNOSIS — M4800 Spinal stenosis, site unspecified: Secondary | ICD-10-CM | POA: Diagnosis not present

## 2019-12-01 DIAGNOSIS — Z48815 Encounter for surgical aftercare following surgery on the digestive system: Secondary | ICD-10-CM | POA: Diagnosis not present

## 2019-12-01 DIAGNOSIS — I442 Atrioventricular block, complete: Secondary | ICD-10-CM | POA: Diagnosis not present

## 2019-12-06 DIAGNOSIS — S91002D Unspecified open wound, left ankle, subsequent encounter: Secondary | ICD-10-CM | POA: Diagnosis not present

## 2019-12-06 DIAGNOSIS — I10 Essential (primary) hypertension: Secondary | ICD-10-CM | POA: Diagnosis not present

## 2019-12-06 DIAGNOSIS — Z48815 Encounter for surgical aftercare following surgery on the digestive system: Secondary | ICD-10-CM | POA: Diagnosis not present

## 2019-12-06 DIAGNOSIS — M4800 Spinal stenosis, site unspecified: Secondary | ICD-10-CM | POA: Diagnosis not present

## 2019-12-06 DIAGNOSIS — I442 Atrioventricular block, complete: Secondary | ICD-10-CM | POA: Diagnosis not present

## 2019-12-06 DIAGNOSIS — E782 Mixed hyperlipidemia: Secondary | ICD-10-CM | POA: Diagnosis not present

## 2019-12-11 DIAGNOSIS — Z8719 Personal history of other diseases of the digestive system: Secondary | ICD-10-CM | POA: Diagnosis not present

## 2019-12-11 DIAGNOSIS — M4800 Spinal stenosis, site unspecified: Secondary | ICD-10-CM | POA: Diagnosis not present

## 2019-12-11 DIAGNOSIS — I442 Atrioventricular block, complete: Secondary | ICD-10-CM | POA: Diagnosis not present

## 2019-12-11 DIAGNOSIS — S91002D Unspecified open wound, left ankle, subsequent encounter: Secondary | ICD-10-CM | POA: Diagnosis not present

## 2019-12-11 DIAGNOSIS — R739 Hyperglycemia, unspecified: Secondary | ICD-10-CM | POA: Diagnosis not present

## 2019-12-11 DIAGNOSIS — I10 Essential (primary) hypertension: Secondary | ICD-10-CM | POA: Diagnosis not present

## 2019-12-11 DIAGNOSIS — E041 Nontoxic single thyroid nodule: Secondary | ICD-10-CM | POA: Diagnosis not present

## 2019-12-11 DIAGNOSIS — Z9181 History of falling: Secondary | ICD-10-CM | POA: Diagnosis not present

## 2019-12-11 DIAGNOSIS — Z950 Presence of cardiac pacemaker: Secondary | ICD-10-CM | POA: Diagnosis not present

## 2019-12-11 DIAGNOSIS — E782 Mixed hyperlipidemia: Secondary | ICD-10-CM | POA: Diagnosis not present

## 2019-12-11 DIAGNOSIS — Z48815 Encounter for surgical aftercare following surgery on the digestive system: Secondary | ICD-10-CM | POA: Diagnosis not present

## 2019-12-12 DIAGNOSIS — Z48815 Encounter for surgical aftercare following surgery on the digestive system: Secondary | ICD-10-CM | POA: Diagnosis not present

## 2019-12-12 DIAGNOSIS — M4800 Spinal stenosis, site unspecified: Secondary | ICD-10-CM | POA: Diagnosis not present

## 2019-12-12 DIAGNOSIS — I442 Atrioventricular block, complete: Secondary | ICD-10-CM | POA: Diagnosis not present

## 2019-12-12 DIAGNOSIS — E782 Mixed hyperlipidemia: Secondary | ICD-10-CM | POA: Diagnosis not present

## 2019-12-12 DIAGNOSIS — S91002D Unspecified open wound, left ankle, subsequent encounter: Secondary | ICD-10-CM | POA: Diagnosis not present

## 2019-12-12 DIAGNOSIS — I10 Essential (primary) hypertension: Secondary | ICD-10-CM | POA: Diagnosis not present

## 2019-12-19 DIAGNOSIS — I10 Essential (primary) hypertension: Secondary | ICD-10-CM | POA: Diagnosis not present

## 2019-12-19 DIAGNOSIS — E782 Mixed hyperlipidemia: Secondary | ICD-10-CM | POA: Diagnosis not present

## 2019-12-19 DIAGNOSIS — Z48815 Encounter for surgical aftercare following surgery on the digestive system: Secondary | ICD-10-CM | POA: Diagnosis not present

## 2019-12-19 DIAGNOSIS — M4800 Spinal stenosis, site unspecified: Secondary | ICD-10-CM | POA: Diagnosis not present

## 2019-12-19 DIAGNOSIS — I442 Atrioventricular block, complete: Secondary | ICD-10-CM | POA: Diagnosis not present

## 2019-12-19 DIAGNOSIS — S91002D Unspecified open wound, left ankle, subsequent encounter: Secondary | ICD-10-CM | POA: Diagnosis not present

## 2019-12-20 DIAGNOSIS — L03116 Cellulitis of left lower limb: Secondary | ICD-10-CM | POA: Diagnosis not present

## 2019-12-21 DIAGNOSIS — E782 Mixed hyperlipidemia: Secondary | ICD-10-CM | POA: Diagnosis not present

## 2019-12-21 DIAGNOSIS — S91002D Unspecified open wound, left ankle, subsequent encounter: Secondary | ICD-10-CM | POA: Diagnosis not present

## 2019-12-21 DIAGNOSIS — I442 Atrioventricular block, complete: Secondary | ICD-10-CM | POA: Diagnosis not present

## 2019-12-21 DIAGNOSIS — I10 Essential (primary) hypertension: Secondary | ICD-10-CM | POA: Diagnosis not present

## 2019-12-21 DIAGNOSIS — Z48815 Encounter for surgical aftercare following surgery on the digestive system: Secondary | ICD-10-CM | POA: Diagnosis not present

## 2019-12-21 DIAGNOSIS — M4800 Spinal stenosis, site unspecified: Secondary | ICD-10-CM | POA: Diagnosis not present

## 2019-12-26 DIAGNOSIS — I10 Essential (primary) hypertension: Secondary | ICD-10-CM | POA: Diagnosis not present

## 2019-12-26 DIAGNOSIS — E782 Mixed hyperlipidemia: Secondary | ICD-10-CM | POA: Diagnosis not present

## 2019-12-26 DIAGNOSIS — I442 Atrioventricular block, complete: Secondary | ICD-10-CM | POA: Diagnosis not present

## 2019-12-26 DIAGNOSIS — S91002D Unspecified open wound, left ankle, subsequent encounter: Secondary | ICD-10-CM | POA: Diagnosis not present

## 2019-12-26 DIAGNOSIS — Z48815 Encounter for surgical aftercare following surgery on the digestive system: Secondary | ICD-10-CM | POA: Diagnosis not present

## 2019-12-26 DIAGNOSIS — M4800 Spinal stenosis, site unspecified: Secondary | ICD-10-CM | POA: Diagnosis not present

## 2019-12-27 DIAGNOSIS — L03116 Cellulitis of left lower limb: Secondary | ICD-10-CM | POA: Diagnosis not present

## 2019-12-27 DIAGNOSIS — L97821 Non-pressure chronic ulcer of other part of left lower leg limited to breakdown of skin: Secondary | ICD-10-CM | POA: Diagnosis not present

## 2020-01-05 DIAGNOSIS — I442 Atrioventricular block, complete: Secondary | ICD-10-CM | POA: Diagnosis not present

## 2020-01-05 DIAGNOSIS — E782 Mixed hyperlipidemia: Secondary | ICD-10-CM | POA: Diagnosis not present

## 2020-01-05 DIAGNOSIS — I10 Essential (primary) hypertension: Secondary | ICD-10-CM | POA: Diagnosis not present

## 2020-01-05 DIAGNOSIS — Z48815 Encounter for surgical aftercare following surgery on the digestive system: Secondary | ICD-10-CM | POA: Diagnosis not present

## 2020-01-05 DIAGNOSIS — S91002D Unspecified open wound, left ankle, subsequent encounter: Secondary | ICD-10-CM | POA: Diagnosis not present

## 2020-01-05 DIAGNOSIS — M4800 Spinal stenosis, site unspecified: Secondary | ICD-10-CM | POA: Diagnosis not present

## 2020-01-09 DIAGNOSIS — M4800 Spinal stenosis, site unspecified: Secondary | ICD-10-CM | POA: Diagnosis not present

## 2020-01-09 DIAGNOSIS — I10 Essential (primary) hypertension: Secondary | ICD-10-CM | POA: Diagnosis not present

## 2020-01-09 DIAGNOSIS — Z48815 Encounter for surgical aftercare following surgery on the digestive system: Secondary | ICD-10-CM | POA: Diagnosis not present

## 2020-01-09 DIAGNOSIS — I442 Atrioventricular block, complete: Secondary | ICD-10-CM | POA: Diagnosis not present

## 2020-01-09 DIAGNOSIS — S91002D Unspecified open wound, left ankle, subsequent encounter: Secondary | ICD-10-CM | POA: Diagnosis not present

## 2020-01-09 DIAGNOSIS — E782 Mixed hyperlipidemia: Secondary | ICD-10-CM | POA: Diagnosis not present

## 2020-01-10 DIAGNOSIS — R739 Hyperglycemia, unspecified: Secondary | ICD-10-CM | POA: Diagnosis not present

## 2020-01-10 DIAGNOSIS — E782 Mixed hyperlipidemia: Secondary | ICD-10-CM | POA: Diagnosis not present

## 2020-01-10 DIAGNOSIS — M4800 Spinal stenosis, site unspecified: Secondary | ICD-10-CM | POA: Diagnosis not present

## 2020-01-10 DIAGNOSIS — E041 Nontoxic single thyroid nodule: Secondary | ICD-10-CM | POA: Diagnosis not present

## 2020-01-10 DIAGNOSIS — S81802D Unspecified open wound, left lower leg, subsequent encounter: Secondary | ICD-10-CM | POA: Diagnosis not present

## 2020-01-10 DIAGNOSIS — Z950 Presence of cardiac pacemaker: Secondary | ICD-10-CM | POA: Diagnosis not present

## 2020-01-10 DIAGNOSIS — S91002D Unspecified open wound, left ankle, subsequent encounter: Secondary | ICD-10-CM | POA: Diagnosis not present

## 2020-01-10 DIAGNOSIS — I442 Atrioventricular block, complete: Secondary | ICD-10-CM | POA: Diagnosis not present

## 2020-01-10 DIAGNOSIS — I10 Essential (primary) hypertension: Secondary | ICD-10-CM | POA: Diagnosis not present

## 2020-01-10 DIAGNOSIS — Z9181 History of falling: Secondary | ICD-10-CM | POA: Diagnosis not present

## 2020-01-10 DIAGNOSIS — Z8719 Personal history of other diseases of the digestive system: Secondary | ICD-10-CM | POA: Diagnosis not present

## 2020-01-10 DIAGNOSIS — Z48815 Encounter for surgical aftercare following surgery on the digestive system: Secondary | ICD-10-CM | POA: Diagnosis not present

## 2020-01-11 DIAGNOSIS — I10 Essential (primary) hypertension: Secondary | ICD-10-CM | POA: Diagnosis not present

## 2020-01-11 DIAGNOSIS — M4800 Spinal stenosis, site unspecified: Secondary | ICD-10-CM | POA: Diagnosis not present

## 2020-01-11 DIAGNOSIS — L03116 Cellulitis of left lower limb: Secondary | ICD-10-CM | POA: Diagnosis not present

## 2020-01-11 DIAGNOSIS — E782 Mixed hyperlipidemia: Secondary | ICD-10-CM | POA: Diagnosis not present

## 2020-01-11 DIAGNOSIS — I442 Atrioventricular block, complete: Secondary | ICD-10-CM | POA: Diagnosis not present

## 2020-01-11 DIAGNOSIS — L97821 Non-pressure chronic ulcer of other part of left lower leg limited to breakdown of skin: Secondary | ICD-10-CM | POA: Diagnosis not present

## 2020-01-11 DIAGNOSIS — S81802D Unspecified open wound, left lower leg, subsequent encounter: Secondary | ICD-10-CM | POA: Diagnosis not present

## 2020-01-11 DIAGNOSIS — R739 Hyperglycemia, unspecified: Secondary | ICD-10-CM | POA: Diagnosis not present

## 2020-01-13 DIAGNOSIS — M4800 Spinal stenosis, site unspecified: Secondary | ICD-10-CM | POA: Diagnosis not present

## 2020-01-13 DIAGNOSIS — S81802D Unspecified open wound, left lower leg, subsequent encounter: Secondary | ICD-10-CM | POA: Diagnosis not present

## 2020-01-13 DIAGNOSIS — I10 Essential (primary) hypertension: Secondary | ICD-10-CM | POA: Diagnosis not present

## 2020-01-13 DIAGNOSIS — E782 Mixed hyperlipidemia: Secondary | ICD-10-CM | POA: Diagnosis not present

## 2020-01-13 DIAGNOSIS — R739 Hyperglycemia, unspecified: Secondary | ICD-10-CM | POA: Diagnosis not present

## 2020-01-13 DIAGNOSIS — I442 Atrioventricular block, complete: Secondary | ICD-10-CM | POA: Diagnosis not present

## 2020-01-17 DIAGNOSIS — E782 Mixed hyperlipidemia: Secondary | ICD-10-CM | POA: Diagnosis not present

## 2020-01-17 DIAGNOSIS — R739 Hyperglycemia, unspecified: Secondary | ICD-10-CM | POA: Diagnosis not present

## 2020-01-17 DIAGNOSIS — I442 Atrioventricular block, complete: Secondary | ICD-10-CM | POA: Diagnosis not present

## 2020-01-17 DIAGNOSIS — M4800 Spinal stenosis, site unspecified: Secondary | ICD-10-CM | POA: Diagnosis not present

## 2020-01-17 DIAGNOSIS — I10 Essential (primary) hypertension: Secondary | ICD-10-CM | POA: Diagnosis not present

## 2020-01-17 DIAGNOSIS — S81802D Unspecified open wound, left lower leg, subsequent encounter: Secondary | ICD-10-CM | POA: Diagnosis not present

## 2020-01-24 ENCOUNTER — Ambulatory Visit (INDEPENDENT_AMBULATORY_CARE_PROVIDER_SITE_OTHER): Payer: Medicare Other

## 2020-01-24 DIAGNOSIS — I442 Atrioventricular block, complete: Secondary | ICD-10-CM

## 2020-01-25 ENCOUNTER — Telehealth: Payer: Self-pay

## 2020-01-25 DIAGNOSIS — R739 Hyperglycemia, unspecified: Secondary | ICD-10-CM | POA: Diagnosis not present

## 2020-01-25 DIAGNOSIS — M4800 Spinal stenosis, site unspecified: Secondary | ICD-10-CM | POA: Diagnosis not present

## 2020-01-25 DIAGNOSIS — I442 Atrioventricular block, complete: Secondary | ICD-10-CM | POA: Diagnosis not present

## 2020-01-25 DIAGNOSIS — E782 Mixed hyperlipidemia: Secondary | ICD-10-CM | POA: Diagnosis not present

## 2020-01-25 DIAGNOSIS — I10 Essential (primary) hypertension: Secondary | ICD-10-CM | POA: Diagnosis not present

## 2020-01-25 DIAGNOSIS — S81802D Unspecified open wound, left lower leg, subsequent encounter: Secondary | ICD-10-CM | POA: Diagnosis not present

## 2020-01-25 LAB — CUP PACEART REMOTE DEVICE CHECK
Battery Impedance: 473 Ohm
Battery Remaining Longevity: 84 mo
Battery Voltage: 2.8 V
Brady Statistic AP VP Percent: 20 %
Brady Statistic AP VS Percent: 0 %
Brady Statistic AS VP Percent: 78 %
Brady Statistic AS VS Percent: 2 %
Date Time Interrogation Session: 20211102125753
Implantable Lead Implant Date: 20151009
Implantable Lead Implant Date: 20151009
Implantable Lead Location: 753859
Implantable Lead Location: 753860
Implantable Lead Model: 5076
Implantable Lead Model: 5076
Implantable Pulse Generator Implant Date: 20151009
Lead Channel Impedance Value: 498 Ohm
Lead Channel Impedance Value: 498 Ohm
Lead Channel Pacing Threshold Amplitude: 0.375 V
Lead Channel Pacing Threshold Amplitude: 0.625 V
Lead Channel Pacing Threshold Pulse Width: 0.4 ms
Lead Channel Pacing Threshold Pulse Width: 0.4 ms
Lead Channel Setting Pacing Amplitude: 2 V
Lead Channel Setting Pacing Amplitude: 2.5 V
Lead Channel Setting Pacing Pulse Width: 0.4 ms
Lead Channel Setting Sensing Sensitivity: 4 mV

## 2020-01-25 NOTE — Telephone Encounter (Signed)
Error

## 2020-01-26 NOTE — Progress Notes (Signed)
Remote pacemaker transmission.   

## 2020-02-02 DIAGNOSIS — S81802D Unspecified open wound, left lower leg, subsequent encounter: Secondary | ICD-10-CM | POA: Diagnosis not present

## 2020-02-02 DIAGNOSIS — L97821 Non-pressure chronic ulcer of other part of left lower leg limited to breakdown of skin: Secondary | ICD-10-CM | POA: Diagnosis not present

## 2020-02-02 DIAGNOSIS — I442 Atrioventricular block, complete: Secondary | ICD-10-CM | POA: Diagnosis not present

## 2020-02-02 DIAGNOSIS — E782 Mixed hyperlipidemia: Secondary | ICD-10-CM | POA: Diagnosis not present

## 2020-02-02 DIAGNOSIS — R739 Hyperglycemia, unspecified: Secondary | ICD-10-CM | POA: Diagnosis not present

## 2020-02-02 DIAGNOSIS — I10 Essential (primary) hypertension: Secondary | ICD-10-CM | POA: Diagnosis not present

## 2020-02-02 DIAGNOSIS — M4800 Spinal stenosis, site unspecified: Secondary | ICD-10-CM | POA: Diagnosis not present

## 2020-02-06 DIAGNOSIS — R739 Hyperglycemia, unspecified: Secondary | ICD-10-CM | POA: Diagnosis not present

## 2020-02-06 DIAGNOSIS — M4800 Spinal stenosis, site unspecified: Secondary | ICD-10-CM | POA: Diagnosis not present

## 2020-02-06 DIAGNOSIS — S81802D Unspecified open wound, left lower leg, subsequent encounter: Secondary | ICD-10-CM | POA: Diagnosis not present

## 2020-02-06 DIAGNOSIS — E782 Mixed hyperlipidemia: Secondary | ICD-10-CM | POA: Diagnosis not present

## 2020-02-06 DIAGNOSIS — I10 Essential (primary) hypertension: Secondary | ICD-10-CM | POA: Diagnosis not present

## 2020-02-06 DIAGNOSIS — I442 Atrioventricular block, complete: Secondary | ICD-10-CM | POA: Diagnosis not present

## 2020-02-09 DIAGNOSIS — Z8719 Personal history of other diseases of the digestive system: Secondary | ICD-10-CM | POA: Diagnosis not present

## 2020-02-09 DIAGNOSIS — R739 Hyperglycemia, unspecified: Secondary | ICD-10-CM | POA: Diagnosis not present

## 2020-02-09 DIAGNOSIS — Z9181 History of falling: Secondary | ICD-10-CM | POA: Diagnosis not present

## 2020-02-09 DIAGNOSIS — Z950 Presence of cardiac pacemaker: Secondary | ICD-10-CM | POA: Diagnosis not present

## 2020-02-09 DIAGNOSIS — M4800 Spinal stenosis, site unspecified: Secondary | ICD-10-CM | POA: Diagnosis not present

## 2020-02-09 DIAGNOSIS — E041 Nontoxic single thyroid nodule: Secondary | ICD-10-CM | POA: Diagnosis not present

## 2020-02-09 DIAGNOSIS — E782 Mixed hyperlipidemia: Secondary | ICD-10-CM | POA: Diagnosis not present

## 2020-02-09 DIAGNOSIS — I10 Essential (primary) hypertension: Secondary | ICD-10-CM | POA: Diagnosis not present

## 2020-02-09 DIAGNOSIS — S81802D Unspecified open wound, left lower leg, subsequent encounter: Secondary | ICD-10-CM | POA: Diagnosis not present

## 2020-02-09 DIAGNOSIS — I442 Atrioventricular block, complete: Secondary | ICD-10-CM | POA: Diagnosis not present

## 2020-02-14 DIAGNOSIS — I10 Essential (primary) hypertension: Secondary | ICD-10-CM | POA: Diagnosis not present

## 2020-02-14 DIAGNOSIS — E782 Mixed hyperlipidemia: Secondary | ICD-10-CM | POA: Diagnosis not present

## 2020-02-14 DIAGNOSIS — S81802D Unspecified open wound, left lower leg, subsequent encounter: Secondary | ICD-10-CM | POA: Diagnosis not present

## 2020-02-14 DIAGNOSIS — I442 Atrioventricular block, complete: Secondary | ICD-10-CM | POA: Diagnosis not present

## 2020-02-14 DIAGNOSIS — R739 Hyperglycemia, unspecified: Secondary | ICD-10-CM | POA: Diagnosis not present

## 2020-02-14 DIAGNOSIS — M4800 Spinal stenosis, site unspecified: Secondary | ICD-10-CM | POA: Diagnosis not present

## 2020-02-22 DIAGNOSIS — E782 Mixed hyperlipidemia: Secondary | ICD-10-CM | POA: Diagnosis not present

## 2020-02-22 DIAGNOSIS — S81802D Unspecified open wound, left lower leg, subsequent encounter: Secondary | ICD-10-CM | POA: Diagnosis not present

## 2020-02-22 DIAGNOSIS — I442 Atrioventricular block, complete: Secondary | ICD-10-CM | POA: Diagnosis not present

## 2020-02-22 DIAGNOSIS — M4800 Spinal stenosis, site unspecified: Secondary | ICD-10-CM | POA: Diagnosis not present

## 2020-02-22 DIAGNOSIS — R739 Hyperglycemia, unspecified: Secondary | ICD-10-CM | POA: Diagnosis not present

## 2020-02-22 DIAGNOSIS — I10 Essential (primary) hypertension: Secondary | ICD-10-CM | POA: Diagnosis not present

## 2020-03-01 DIAGNOSIS — I442 Atrioventricular block, complete: Secondary | ICD-10-CM | POA: Diagnosis not present

## 2020-03-01 DIAGNOSIS — I10 Essential (primary) hypertension: Secondary | ICD-10-CM | POA: Diagnosis not present

## 2020-03-01 DIAGNOSIS — E782 Mixed hyperlipidemia: Secondary | ICD-10-CM | POA: Diagnosis not present

## 2020-03-01 DIAGNOSIS — M4800 Spinal stenosis, site unspecified: Secondary | ICD-10-CM | POA: Diagnosis not present

## 2020-03-01 DIAGNOSIS — S81802D Unspecified open wound, left lower leg, subsequent encounter: Secondary | ICD-10-CM | POA: Diagnosis not present

## 2020-03-01 DIAGNOSIS — R739 Hyperglycemia, unspecified: Secondary | ICD-10-CM | POA: Diagnosis not present

## 2020-03-06 DIAGNOSIS — Z48815 Encounter for surgical aftercare following surgery on the digestive system: Secondary | ICD-10-CM | POA: Diagnosis not present

## 2020-03-06 DIAGNOSIS — E041 Nontoxic single thyroid nodule: Secondary | ICD-10-CM | POA: Diagnosis not present

## 2020-03-06 DIAGNOSIS — E782 Mixed hyperlipidemia: Secondary | ICD-10-CM | POA: Diagnosis not present

## 2020-03-06 DIAGNOSIS — Z8719 Personal history of other diseases of the digestive system: Secondary | ICD-10-CM | POA: Diagnosis not present

## 2020-03-06 DIAGNOSIS — L03116 Cellulitis of left lower limb: Secondary | ICD-10-CM | POA: Diagnosis not present

## 2020-03-06 DIAGNOSIS — I10 Essential (primary) hypertension: Secondary | ICD-10-CM | POA: Diagnosis not present

## 2020-03-06 DIAGNOSIS — R739 Hyperglycemia, unspecified: Secondary | ICD-10-CM | POA: Diagnosis not present

## 2020-03-06 DIAGNOSIS — M8580 Other specified disorders of bone density and structure, unspecified site: Secondary | ICD-10-CM | POA: Diagnosis not present

## 2020-03-06 DIAGNOSIS — I442 Atrioventricular block, complete: Secondary | ICD-10-CM | POA: Diagnosis not present

## 2020-03-06 DIAGNOSIS — M4800 Spinal stenosis, site unspecified: Secondary | ICD-10-CM | POA: Diagnosis not present

## 2020-03-06 DIAGNOSIS — S91002D Unspecified open wound, left ankle, subsequent encounter: Secondary | ICD-10-CM | POA: Diagnosis not present

## 2020-03-06 DIAGNOSIS — L97821 Non-pressure chronic ulcer of other part of left lower leg limited to breakdown of skin: Secondary | ICD-10-CM | POA: Diagnosis not present

## 2020-03-07 DIAGNOSIS — E782 Mixed hyperlipidemia: Secondary | ICD-10-CM | POA: Diagnosis not present

## 2020-03-07 DIAGNOSIS — R739 Hyperglycemia, unspecified: Secondary | ICD-10-CM | POA: Diagnosis not present

## 2020-03-07 DIAGNOSIS — M4800 Spinal stenosis, site unspecified: Secondary | ICD-10-CM | POA: Diagnosis not present

## 2020-03-07 DIAGNOSIS — I442 Atrioventricular block, complete: Secondary | ICD-10-CM | POA: Diagnosis not present

## 2020-03-07 DIAGNOSIS — I10 Essential (primary) hypertension: Secondary | ICD-10-CM | POA: Diagnosis not present

## 2020-03-07 DIAGNOSIS — S81802D Unspecified open wound, left lower leg, subsequent encounter: Secondary | ICD-10-CM | POA: Diagnosis not present

## 2020-03-08 DIAGNOSIS — E785 Hyperlipidemia, unspecified: Secondary | ICD-10-CM | POA: Diagnosis not present

## 2020-03-08 DIAGNOSIS — E559 Vitamin D deficiency, unspecified: Secondary | ICD-10-CM | POA: Diagnosis not present

## 2020-03-08 DIAGNOSIS — E539 Vitamin B deficiency, unspecified: Secondary | ICD-10-CM | POA: Diagnosis not present

## 2020-03-08 DIAGNOSIS — L97821 Non-pressure chronic ulcer of other part of left lower leg limited to breakdown of skin: Secondary | ICD-10-CM | POA: Diagnosis not present

## 2020-03-08 DIAGNOSIS — M8580 Other specified disorders of bone density and structure, unspecified site: Secondary | ICD-10-CM | POA: Diagnosis not present

## 2020-03-08 DIAGNOSIS — I10 Essential (primary) hypertension: Secondary | ICD-10-CM | POA: Diagnosis not present

## 2020-04-17 ENCOUNTER — Other Ambulatory Visit: Payer: Self-pay

## 2020-04-17 ENCOUNTER — Ambulatory Visit
Admission: RE | Admit: 2020-04-17 | Discharge: 2020-04-17 | Disposition: A | Discharge status: Home / Self Care | Payer: Medicare Other | Source: Ambulatory Visit | Attending: Emergency Medicine | Admitting: Emergency Medicine

## 2020-04-17 VITALS — BP 156/66 | HR 84 | Temp 98.1°F | Resp 17

## 2020-04-17 DIAGNOSIS — L0390 Cellulitis, unspecified: Secondary | ICD-10-CM | POA: Diagnosis not present

## 2020-04-17 DIAGNOSIS — L539 Erythematous condition, unspecified: Secondary | ICD-10-CM | POA: Diagnosis not present

## 2020-04-17 MED ORDER — MUPIROCIN 2 % EX OINT: 1.0000 "application " | TOPICAL_OINTMENT | Freq: Two times a day (BID) | CUTANEOUS | 0 refills | Status: DC

## 2020-04-17 MED ORDER — DOXYCYCLINE HYCLATE 100 MG PO CAPS
100.0000 mg | ORAL_CAPSULE | Freq: Two times a day (BID) | ORAL | 0 refills | Status: DC
Start: 2020-04-17 — End: 2021-05-01

## 2020-04-17 NOTE — Discharge Instructions (Signed)
Keep wound clean with warm water and mild soap Apply thin layer of Bactroban May leave wound open to air dry Follow-up with PCP Return or go to ED if you develop any new or worsening of his symptom

## 2020-04-17 NOTE — ED Provider Notes (Addendum)
[facility]   [id number] 04/17/20 Arrival Time: [id number]   Chief Complaint  Patient presents with  . Wound Infection    SUBJECTIVE: History from: patient and caregiver.  Laura Martin is a 87 y.o. female who presented to the urgent care for complaint of laceration to anterior left leg for the past 2 weeks.  She is reporting erythema and increased pain over the past few days.  Has tried OTC medication without relief.  Symptoms are made worse with range of motion and touching.  Report previous symptoms in the past that resolved with doxycycline treatment and wound nurse referral.  Denies fever, chills, fatigue, sinus pain, rhinorrhea, sore throat, SOB, wheezing, chest pain, nausea, changes in bowel or bladder habits.     ROS: As per HPI.  All other pertinent ROS negative.      Past Medical History:  Diagnosis Date  . Complete heart block ([place])    a. s/p PPM 12/31/13:  Medtronic Adapta L (serial number [id number]) pacemaker  . Essential hypertension   . Goiter   . Mixed hyperlipidemia   . Spinal stenosis   . Thyroid nodule    Past Surgical History:  Procedure Laterality Date  . BIOPSY THYROID Left 11/2006  . BREAST BIOPSY Right 1990's   "calcium deposit"  . CATARACT EXTRACTION W/ INTRAOCULAR LENS  IMPLANT, BILATERAL Bilateral 2001  . DILATION AND CURETTAGE OF UTERUS     S/P miscarriage  . entropion  11/04/2017   right eye  . ESOPHAGEAL DILATION N/A 03/08/2015   Procedure: ESOPHAGEAL DILATION;  Surgeon: Rogene Houston, MD;  Location: [facility] ENDO SUITE;  Service: Endoscopy;  Laterality: N/A;  . ESOPHAGOGASTRODUODENOSCOPY N/A 03/08/2015   Procedure: ESOPHAGOGASTRODUODENOSCOPY (EGD);  Surgeon: Rogene Houston, MD;  Location: [facility] ENDO SUITE;  Service: Endoscopy;  Laterality: N/A;  3:00  . INSERT / REPLACE / REMOVE PACEMAKER  12/30/2013  . KNEE ARTHROSCOPY Left 1986  . PERMANENT PACEMAKER INSERTION N/A 12/30/2013   Procedure: PERMANENT PACEMAKER INSERTION;  Surgeon: Evans Lance, MD;  Location: [facility] CATH LAB;  Service: Cardiovascular;  Laterality: N/A;  . SMALL BOWEL REPAIR N/A 11/05/2019   Procedure: SMALL BOWEL REPAIR;  Surgeon: Aviva Signs, MD;  Location: [facility] ORS;  Service: General;  Laterality: N/A;  . TONSILLECTOMY  ~ 1943  . TOTAL KNEE ARTHROPLASTY Left 09/2010   Allergies  Allergen Reactions  . Codeine Nausea And Vomiting  . Tramadol Nausea And Vomiting  . Sulfa Antibiotics Rash   No current facility-administered medications on file prior to encounter.   Current Outpatient Medications on File Prior to Encounter  Medication Sig Dispense Refill  . amLODipine (NORVASC) 5 MG tablet Take 5 mg by mouth daily.    . Calcium Carbonate (CALCIUM 600 PO) Take 600 mg by mouth daily.     . cholecalciferol (VITAMIN D) 400 UNITS TABS tablet Take 400 Units by mouth.    . Coenzyme Q10 (CO Q 10) 100 MG CAPS Take 100 mg by mouth daily.    . diphenhydrAMINE (BENADRYL) 25 MG tablet Take 25 mg by mouth at bedtime.     . fish oil-omega-3 fatty acids 1000 MG capsule Take 1 g by mouth daily.     Social History   Socioeconomic History  . Marital status: Married    Spouse name: Not on file  . Number of children: Not on file  . Years of education: Not on file  . Highest education level: Not on file  Occupational History  . Not  on file  Tobacco Use  . Smoking status: Never Smoker  . Smokeless tobacco: Never Used  Vaping Use  . Vaping Use: Never used  Substance and Sexual Activity  . Alcohol use: No    Alcohol/week: 0.0 standard drinks  . Drug use: No  . Sexual activity: Not on file  Other Topics Concern  . Not on file  Social History Narrative  . Not on file   Social Determinants of Health   Financial Resource Strain: Not on file  Food Insecurity: Not on file  Transportation Needs: Not on file  Physical Activity: Not on file  Stress: Not on file  Social Connections: Not on file  Intimate Partner Violence: Not on file   Family History  Problem Relation  Age of Onset  . Cancer Other   . Stroke Other     OBJECTIVE:  Vitals:   04/17/20 1023  BP: (!) 156/66  Pulse: 84  Resp: 17  Temp: 98.1 F (36.7 C)  TempSrc: Oral  SpO2: 96%     Physical Exam Vitals and nursing note reviewed.  Constitutional:      General: She is not in acute distress.    Appearance: Normal appearance. She is normal weight. She is not ill-appearing, toxic-appearing or diaphoretic.  HENT:     Head: Normocephalic.  Cardiovascular:     Rate and Rhythm: Normal rate and regular rhythm.     Pulses: Normal pulses.     Heart sounds: Normal heart sounds. No murmur heard. No friction rub. No gallop.   Pulmonary:     Effort: Pulmonary effort is normal. No respiratory distress.     Breath sounds: Normal breath sounds. No stridor. No wheezing, rhonchi or rales.  Chest:     Chest wall: No tenderness.  Skin:    General: Skin is warm.     Capillary Refill: Capillary refill takes less than 2 seconds.     Findings: Erythema and wound present.     Comments: Wound bed 7 cm x 4 cm surrounded by a large skin erythema, warm and tender to touch  Neurological:     Mental Status: She is alert and oriented to person, place, and time.     LABS:  No results found for this or any previous visit (from the past 24 hour(s)).   ASSESSMENT & PLAN:  1. Wound cellulitis   2. Skin erythema     Meds ordered this encounter  Medications  . doxycycline (VIBRAMYCIN) 100 MG capsule    Sig: Take 1 capsule (100 mg total) by mouth 2 (two) times daily.    Dispense:  20 capsule    Refill:  0  . mupirocin ointment (BACTROBAN) 2 %    Sig: Apply 1 application topically 2 (two) times daily.    Dispense:  30 g    Refill:  0    Discharge Instructions  Keep wound clean with warm water and mild soap Apply thin layer of Bactroban May leave wound open to air dry Follow-up with PCP Return or go to ED if you develop any new or worsening of his symptom  Use medications daily for symptom  relief Use OTC medications like ibuprofen or tylenol as needed fever or pain Call or go to the ED if you have any new or worsening symptoms such as fever, worsening cough, shortness of breath, chest tightness, chest pain, turning blue, changes in mental status, etc...   Reviewed expectations re: course of current medical issues. Questions answered. Outlined  signs and symptoms indicating need for more acute intervention. Patient verbalized understanding. After Visit Summary given.         Emerson Monte, FNP 04/17/20 1056    Emerson Monte, FNP 04/17/20 1058

## 2020-04-17 NOTE — ED Triage Notes (Signed)
Cut to front of LT leg x 2 weeks.  Area around the wound is  red  and painful.

## 2020-04-24 ENCOUNTER — Ambulatory Visit (INDEPENDENT_AMBULATORY_CARE_PROVIDER_SITE_OTHER): Payer: Medicare Other

## 2020-04-24 DIAGNOSIS — I442 Atrioventricular block, complete: Secondary | ICD-10-CM | POA: Diagnosis not present

## 2020-04-24 LAB — CUP PACEART REMOTE DEVICE CHECK
Battery Impedance: 522 Ohm
Battery Remaining Longevity: 82 mo
Battery Voltage: 2.8 V
Brady Statistic AP VP Percent: 21 %
Brady Statistic AP VS Percent: 0 %
Brady Statistic AS VP Percent: 78 %
Brady Statistic AS VS Percent: 2 %
Date Time Interrogation Session: 20220201092930
Implantable Lead Implant Date: 20151009
Implantable Lead Implant Date: 20151009
Implantable Lead Location: 753859
Implantable Lead Location: 753860
Implantable Lead Model: 5076
Implantable Lead Model: 5076
Implantable Pulse Generator Implant Date: 20151009
Lead Channel Impedance Value: 490 Ohm
Lead Channel Impedance Value: 564 Ohm
Lead Channel Pacing Threshold Amplitude: 0.375 V
Lead Channel Pacing Threshold Amplitude: 0.5 V
Lead Channel Pacing Threshold Pulse Width: 0.4 ms
Lead Channel Pacing Threshold Pulse Width: 0.4 ms
Lead Channel Setting Pacing Amplitude: 2 V
Lead Channel Setting Pacing Amplitude: 2.5 V
Lead Channel Setting Pacing Pulse Width: 0.4 ms
Lead Channel Setting Sensing Sensitivity: 4 mV

## 2020-04-27 DIAGNOSIS — S8012XD Contusion of left lower leg, subsequent encounter: Secondary | ICD-10-CM | POA: Diagnosis not present

## 2020-05-02 NOTE — Progress Notes (Signed)
Remote pacemaker transmission.   

## 2020-05-07 DIAGNOSIS — S8012XD Contusion of left lower leg, subsequent encounter: Secondary | ICD-10-CM | POA: Diagnosis not present

## 2020-05-21 DIAGNOSIS — S8012XD Contusion of left lower leg, subsequent encounter: Secondary | ICD-10-CM | POA: Diagnosis not present

## 2020-07-18 DIAGNOSIS — S52502D Unspecified fracture of the lower end of left radius, subsequent encounter for closed fracture with routine healing: Secondary | ICD-10-CM | POA: Diagnosis not present

## 2020-07-18 DIAGNOSIS — M25561 Pain in right knee: Secondary | ICD-10-CM | POA: Diagnosis not present

## 2020-07-18 DIAGNOSIS — M25562 Pain in left knee: Secondary | ICD-10-CM | POA: Diagnosis not present

## 2020-07-24 ENCOUNTER — Ambulatory Visit (INDEPENDENT_AMBULATORY_CARE_PROVIDER_SITE_OTHER): Payer: Medicare Other

## 2020-07-24 DIAGNOSIS — I442 Atrioventricular block, complete: Secondary | ICD-10-CM

## 2020-07-25 LAB — CUP PACEART REMOTE DEVICE CHECK
Battery Impedance: 547 Ohm
Battery Remaining Longevity: 80 mo
Battery Voltage: 2.8 V
Brady Statistic AP VP Percent: 22 %
Brady Statistic AP VS Percent: 0 %
Brady Statistic AS VP Percent: 77 %
Brady Statistic AS VS Percent: 2 %
Date Time Interrogation Session: 20220503180115
Implantable Lead Implant Date: 20151009
Implantable Lead Implant Date: 20151009
Implantable Lead Location: 753859
Implantable Lead Location: 753860
Implantable Lead Model: 5076
Implantable Lead Model: 5076
Implantable Pulse Generator Implant Date: 20151009
Lead Channel Impedance Value: 476 Ohm
Lead Channel Impedance Value: 552 Ohm
Lead Channel Pacing Threshold Amplitude: 0.375 V
Lead Channel Pacing Threshold Amplitude: 0.625 V
Lead Channel Pacing Threshold Pulse Width: 0.4 ms
Lead Channel Pacing Threshold Pulse Width: 0.4 ms
Lead Channel Setting Pacing Amplitude: 2 V
Lead Channel Setting Pacing Amplitude: 2.5 V
Lead Channel Setting Pacing Pulse Width: 0.4 ms
Lead Channel Setting Sensing Sensitivity: 4 mV

## 2020-08-15 NOTE — Progress Notes (Signed)
Remote pacemaker transmission.   

## 2020-09-17 DIAGNOSIS — E782 Mixed hyperlipidemia: Secondary | ICD-10-CM | POA: Diagnosis not present

## 2020-09-17 DIAGNOSIS — I10 Essential (primary) hypertension: Secondary | ICD-10-CM | POA: Diagnosis not present

## 2020-09-17 DIAGNOSIS — E539 Vitamin B deficiency, unspecified: Secondary | ICD-10-CM | POA: Diagnosis not present

## 2020-09-20 DIAGNOSIS — I10 Essential (primary) hypertension: Secondary | ICD-10-CM | POA: Diagnosis not present

## 2020-09-20 DIAGNOSIS — E559 Vitamin D deficiency, unspecified: Secondary | ICD-10-CM | POA: Diagnosis not present

## 2020-09-20 DIAGNOSIS — G3184 Mild cognitive impairment, so stated: Secondary | ICD-10-CM | POA: Diagnosis not present

## 2020-09-20 DIAGNOSIS — E782 Mixed hyperlipidemia: Secondary | ICD-10-CM | POA: Diagnosis not present

## 2020-09-20 DIAGNOSIS — E539 Vitamin B deficiency, unspecified: Secondary | ICD-10-CM | POA: Diagnosis not present

## 2020-10-09 DIAGNOSIS — Z20822 Contact with and (suspected) exposure to covid-19: Secondary | ICD-10-CM | POA: Diagnosis not present

## 2020-10-23 ENCOUNTER — Ambulatory Visit (INDEPENDENT_AMBULATORY_CARE_PROVIDER_SITE_OTHER): Payer: Medicare Other

## 2020-10-23 DIAGNOSIS — I442 Atrioventricular block, complete: Secondary | ICD-10-CM | POA: Diagnosis not present

## 2020-10-24 LAB — CUP PACEART REMOTE DEVICE CHECK
Battery Impedance: 622 Ohm
Battery Remaining Longevity: 75 mo
Battery Voltage: 2.79 V
Brady Statistic AP VP Percent: 24 %
Brady Statistic AP VS Percent: 0 %
Brady Statistic AS VP Percent: 75 %
Brady Statistic AS VS Percent: 1 %
Date Time Interrogation Session: 20220802212421
Implantable Lead Implant Date: 20151009
Implantable Lead Implant Date: 20151009
Implantable Lead Location: 753859
Implantable Lead Location: 753860
Implantable Lead Model: 5076
Implantable Lead Model: 5076
Implantable Pulse Generator Implant Date: 20151009
Lead Channel Impedance Value: 475 Ohm
Lead Channel Impedance Value: 584 Ohm
Lead Channel Pacing Threshold Amplitude: 0.375 V
Lead Channel Pacing Threshold Amplitude: 0.875 V
Lead Channel Pacing Threshold Pulse Width: 0.4 ms
Lead Channel Pacing Threshold Pulse Width: 0.4 ms
Lead Channel Setting Pacing Amplitude: 2 V
Lead Channel Setting Pacing Amplitude: 2.5 V
Lead Channel Setting Pacing Pulse Width: 0.4 ms
Lead Channel Setting Sensing Sensitivity: 4 mV

## 2020-11-17 NOTE — Progress Notes (Signed)
Remote pacemaker transmission.   

## 2020-11-24 IMAGING — DX DG CHEST 1V PORT
1 series · 1 of 1 positions shown · non-contrast
Comparison: 12/31/2013

CLINICAL DATA: Preop for surgery

EXAM:
PORTABLE CHEST 1 VIEW

[chest ap]
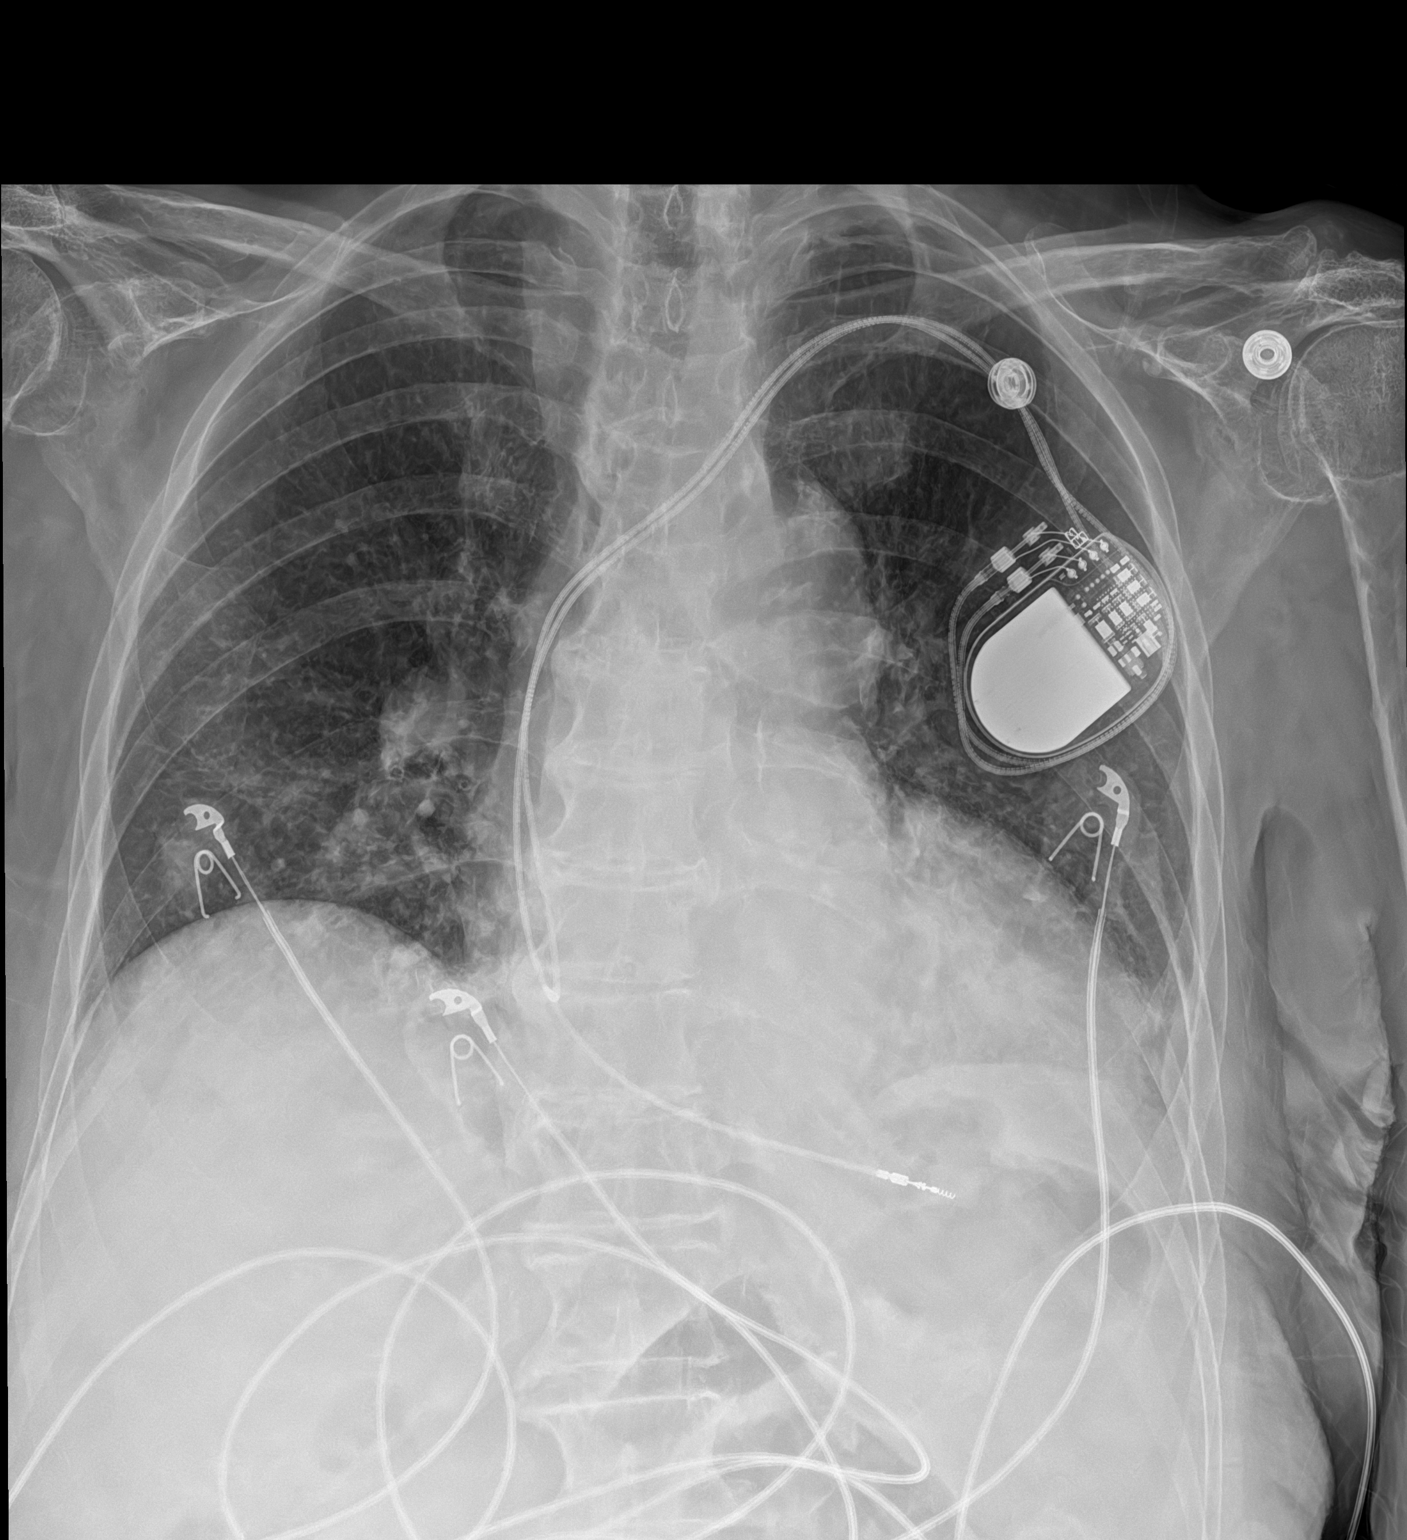

[1 of 1 positions shown; findings below may reference images not displayed]

FINDINGS: E

There is mild bilateral interstitial prominence. No focal
consolidation. No pleural effusion or pneumothorax. The heart
mediastinum are stable. There is a dual lead cardiac pacemaker.

No acute osseous abnormality. There is moderate arthropathy of
bilateral AC joints. There is osteoarthritis of bilateral
glenohumeral joints.
IMPRESSION: No acute cardiopulmonary disease.

## 2020-12-13 ENCOUNTER — Telehealth: Payer: Self-pay | Admitting: Physical Medicine and Rehabilitation

## 2020-12-13 NOTE — Telephone Encounter (Signed)
Gregary Signs the pts daughter called on her behalf , wanting to get an appt for the pts lower back and hip pain.   [phone]

## 2020-12-13 NOTE — Telephone Encounter (Signed)
Patient was last seen in 2017. Offered OV on 9/26 but patient could not come that day. Scheduled for 9/27 at 1330.

## 2020-12-18 ENCOUNTER — Encounter: Payer: Self-pay | Admitting: Physical Medicine and Rehabilitation

## 2020-12-18 ENCOUNTER — Ambulatory Visit (INDEPENDENT_AMBULATORY_CARE_PROVIDER_SITE_OTHER): Payer: Medicare Other | Admitting: Physical Medicine and Rehabilitation

## 2020-12-18 ENCOUNTER — Other Ambulatory Visit: Payer: Self-pay

## 2020-12-18 DIAGNOSIS — M47816 Spondylosis without myelopathy or radiculopathy, lumbar region: Secondary | ICD-10-CM | POA: Diagnosis not present

## 2020-12-18 DIAGNOSIS — M48062 Spinal stenosis, lumbar region with neurogenic claudication: Secondary | ICD-10-CM

## 2020-12-18 DIAGNOSIS — M4316 Spondylolisthesis, lumbar region: Secondary | ICD-10-CM | POA: Diagnosis not present

## 2020-12-18 DIAGNOSIS — M5416 Radiculopathy, lumbar region: Secondary | ICD-10-CM

## 2020-12-18 DIAGNOSIS — M47819 Spondylosis without myelopathy or radiculopathy, site unspecified: Secondary | ICD-10-CM

## 2020-12-18 NOTE — Progress Notes (Signed)
Laura Martin - 87 y.o. female MRN [id number]  Date of birth: 1925-10-23  Office Visit Note: Visit Date: 12/18/2020 PCP: Celene Squibb, MD Referred by: Celene Squibb, MD  Subjective: Chief Complaint  Patient presents with   Lower Back - Pain   Right Leg - Pain   HPI: Laura Martin is a 87 y.o. female who comes in today for evaluation of chronic, worsening and severe right sided lower back pain radiating to buttock and hip. Patient is somewhat of a poor history due to issues with memory. Patient's daughter accompanied her today in the office. Patient reports pain has been chronic for many years, however pain worsened approximately 3 weeks ago. Patient reports pain is exacerbated by activity and walking, describes as soreness sensation, currently rates as 9 out of 10. Patient reports some pain relief with Tylenol and heating pad.  The last time we saw the patient was in 2017 prior to our movement to [facility].  We did review notes from our old electronic medical record.  Patient's lumbar MRI from 2011 exhibits severe spinal canal stenosis at L4-L5. Patient had multiple bilateral L4 transforaminal epidural steroid injections over the years that she reports did help to alleviate her pain.  I also have seen her husband over the years and we have recently been seeing him for some increased back pain that he has been having.  Patient does have a pacemaker and is currently being managed by Dr. Rozann Lesches at [facility]. Patient uses a cane at home to aid with ambulation and prevent falls. Patient denies focal weakness, numbness and tingling. Patient denies recent trauma or falls.   Review of Systems  Musculoskeletal:  Positive for back pain and joint pain.  Neurological:  Negative for tingling, sensory change, focal weakness and weakness.  All other systems reviewed and are negative. Otherwise per HPI.  Assessment & Plan: Visit Diagnoses:    ICD-10-CM   1. Lumbar radiculopathy   M54.16 Ambulatory referral to Physical Medicine Rehab    2. Spinal stenosis of lumbar region with neurogenic claudication  M48.062     3. Spondylosis without myelopathy or radiculopathy  M47.819     4. Spondylolisthesis of lumbar region  M43.16     5. Facet arthropathy, lumbar  M47.816        Plan: Findings:  Chronic, worsening and severe right sided lower back pain radiating to buttock and hip. Patient continues to have severe pain despite good conservative therapies. Patient's clinical presentation is consistent with classic L4 nerve pattern.  She is really not having any red flag symptoms and no new trauma to warrant advanced imaging necessarily at the moment.  Good and sustained relief from previous L4 transforaminal epidural steroid injections. We believe the next step is to perform diagnostic and hopefully therapeutic right L4 transforaminal epidural steroid injection. If patient gets significant and sustained pain relief with this injection we will continue to monitor, however if her pain persists we will consider performing CT of lumbar spine. Patient encouraged to stay active as tolerated and to use cane as directed. No red flag symptoms noted upon exam today.    Meds & Orders: No orders of the defined types were placed in this encounter.   Orders Placed This Encounter  Procedures   Ambulatory referral to Physical Medicine Rehab    Follow-up: Return in about 1 week (around 12/25/2020) for Right L4 transforaminal epidural steroid injection.   Procedures: No procedures performed  Clinical History: MRI LUMBAR SPINE WITHOUT CONTRAST 11/21/2009   Technique: Multiplanar and multiecho pulse sequences of the lumbar spine were obtained without intravenous contrast.   Comparison: None   Findings: Through the scan extends from T11-12 through the second sacral segment. The tip of the conus is at L1-2 and appears normal. The individual disc spaces are examined as follows:    T11-12: There is a tiny focal bulge of the disc to the right of midline with no significant impingement.   T12-L1 through L2-3: No significant abnormalities.   L3-4: There is mild left facet joint arthritis and ligamentum flavum hypertrophy as well as a right far lateral bulge of the disc without significant impingement. There is mild narrowing of the spinal canal.   L4-5: There is marked hypertrophy of the ligamenta flava and facet joints with a 5 mm spondylolisthesis of L4-L5 with slight bulging of the uncovered disc.   This causes severe spinal stenosis with almost complete obliteration of the thecal sac with particularly severe compression of the left side of the thecal sac and left lateral recess.   L5-S1: There is a far lateral bulge of the disc in and lateral to the left neural foramen without impingement upon the exiting L5 nerve root.   IMPRESSION:   1. Severe spinal stenosis at L4-5 due to hypertrophy of the ligamenta flava and facet joints and a 5 mm spondylolisthesis. The spinal canal is almost occluded. 2. No other significant abnormalities.   She reports that she has never smoked. She has never used smokeless tobacco. No results for input(s): HGBA1C, LABURIC in the last 8760 hours.  Objective:  VS:  HT:    WT:   BMI:     BP:   HR: bpm  TEMP: ( )  RESP:  Physical Exam HENT:     Head: Normocephalic and atraumatic.     Right Ear: Tympanic membrane normal.     Left Ear: Tympanic membrane normal.     Nose: Nose normal.     Mouth/Throat:     Mouth: Mucous membranes are moist.  Eyes:     Pupils: Pupils are equal, round, and reactive to light.  Cardiovascular:     Rate and Rhythm: Normal rate.     Pulses: Normal pulses.  Pulmonary:     Effort: Pulmonary effort is normal.  Abdominal:     General: Abdomen is flat. There is no distension.  Musculoskeletal:        General: Tenderness present.     Cervical back: Normal range of motion.     Comments: Pt is  slow to rise from seated position to standing. Good lumbar range of motion. Strong distal strength without clonus, no pain upon palpation of greater trochanters. No pain noted upon flexion/extension and rotation of right hip. Sensation intact bilaterally.  Ambulates with cane, gait unsteady.    Skin:    General: Skin is warm and dry.     Capillary Refill: Capillary refill takes less than 2 seconds.  Neurological:     Mental Status: She is alert.     Gait: Gait abnormal.  Psychiatric:        Mood and Affect: Mood normal.    Ortho Exam  Imaging: No results found.  Past Medical/Family/Surgical/Social History: Medications & Allergies reviewed per EMR, new medications updated. Patient Active Problem List   Diagnosis Date Noted   Small bowel obstruction ([place]) 11/05/2019   SBO (small bowel obstruction) ([place]) 11/05/2019   Lack of intravenous access  Intestinal adhesions with complete obstruction ([facility])    Elevated glucose 04/21/2019   Anemia 04/21/2019   Osteoporosis 04/21/2019   Pacemaker 05/12/2014   Complete heart block ([facility]) 01/01/2014   Mobitz type 2 second degree AV block 12/30/2013   Heart murmur 11/11/2013   Essential hypertension, benign 05/27/2011   Mixed hyperlipidemia 05/27/2011   Past Medical History:  Diagnosis Date   Complete heart block ([place])    a. s/p PPM 12/31/13:  Medtronic Adapta L (serial number [id number]) pacemaker   Essential hypertension    Goiter    Mixed hyperlipidemia    Spinal stenosis    Thyroid nodule    Family History  Problem Relation Age of Onset   Cancer Other    Stroke Other    Past Surgical History:  Procedure Laterality Date   BIOPSY THYROID Left 11/2006   BREAST BIOPSY Right 1990's   "calcium deposit"   CATARACT EXTRACTION W/ INTRAOCULAR LENS  IMPLANT, BILATERAL Bilateral 2001   DILATION AND CURETTAGE OF UTERUS     S/P miscarriage   entropion  11/04/2017   right eye   ESOPHAGEAL DILATION N/A 03/08/2015   Procedure: ESOPHAGEAL  DILATION;  Surgeon: Rogene Houston, MD;  Location: [facility];  Service: Endoscopy;  Laterality: N/A;   ESOPHAGOGASTRODUODENOSCOPY N/A 03/08/2015   Procedure: ESOPHAGOGASTRODUODENOSCOPY (EGD);  Surgeon: Rogene Houston, MD;  Location: [facility];  Service: Endoscopy;  Laterality: N/A;  3:00   INSERT / REPLACE / REMOVE PACEMAKER  12/30/2013   KNEE ARTHROSCOPY Left 1986   PERMANENT PACEMAKER INSERTION N/A 12/30/2013   Procedure: PERMANENT PACEMAKER INSERTION;  Surgeon: Evans Lance, MD;  Location: [facility] CATH LAB;  Service: Cardiovascular;  Laterality: N/A;   SMALL BOWEL REPAIR N/A 11/05/2019   Procedure: SMALL BOWEL REPAIR;  Surgeon: Aviva Signs, MD;  Location: [facility] ORS;  Service: General;  Laterality: N/A;   TONSILLECTOMY  ~ [place] Left 09/2010   Social History   Occupational History   Not on file  Tobacco Use   Smoking status: Never   Smokeless tobacco: Never  Vaping Use   Vaping Use: Never used  Substance and Sexual Activity   Alcohol use: No    Alcohol/week: 0.0 standard drinks   Drug use: No   Sexual activity: Not on file

## 2020-12-18 NOTE — Progress Notes (Signed)
Pt state lower back pain that travels to her right hip and buttock. Pt state walking and standing makes the pain worse. Pt state she takes over the counter pain meds, pain creams  and uses heating to help ease her pain.  Numeric Pain Rating Scale and Functional Assessment Average Pain 10 Pain Right Now 5 My pain is intermittent, dull, and aching Pain is worse with: walking, standing, and some activites Pain improves with: heat/ice and medication   In the last MONTH (on 0-10 scale) has pain interfered with the following?  1. General activity like being  able to carry out your everyday physical activities such as walking, climbing stairs, carrying groceries, or moving a chair?  Rating(7)  2. Relation with others like being able to carry out your usual social activities and roles such as  activities at home, at work and in your community. Rating(8)  3. Enjoyment of life such that you have  been bothered by emotional problems such as feeling anxious, depressed or irritable?  Rating(9)

## 2020-12-19 DIAGNOSIS — H903 Sensorineural hearing loss, bilateral: Secondary | ICD-10-CM | POA: Diagnosis not present

## 2020-12-19 DIAGNOSIS — H838X3 Other specified diseases of inner ear, bilateral: Secondary | ICD-10-CM | POA: Diagnosis not present

## 2020-12-20 ENCOUNTER — Ambulatory Visit (INDEPENDENT_AMBULATORY_CARE_PROVIDER_SITE_OTHER): Payer: Medicare Other | Admitting: Physical Medicine and Rehabilitation

## 2020-12-20 ENCOUNTER — Encounter: Payer: Self-pay | Admitting: Physical Medicine and Rehabilitation

## 2020-12-20 ENCOUNTER — Ambulatory Visit: Payer: Self-pay

## 2020-12-20 ENCOUNTER — Other Ambulatory Visit: Payer: Self-pay

## 2020-12-20 VITALS — BP 162/68 | HR 74

## 2020-12-20 DIAGNOSIS — M5416 Radiculopathy, lumbar region: Secondary | ICD-10-CM

## 2020-12-20 MED ORDER — METHYLPREDNISOLONE ACETATE 80 MG/ML IJ SUSP
80.0000 mg | Freq: Once | INTRAMUSCULAR | Status: AC
  Administered 2020-12-20: 80 mg

## 2020-12-20 NOTE — Patient Instructions (Signed)

## 2020-12-20 NOTE — Progress Notes (Signed)
Pt state lower back pain that travels to her right hip and buttock. Pt state walking and standing makes the pain worse. Pt state she takes over the counter pain meds, pain creams  and uses heating to help ease her pain.  Numeric Pain Rating Scale and Functional Assessment Average Pain 5   In the last MONTH (on 0-10 scale) has pain interfered with the following?  1. General activity like being  able to carry out your everyday physical activities such as walking, climbing stairs, carrying groceries, or moving a chair?  Rating(8)   +Driver, -BT, -Dye Allergies.

## 2020-12-21 NOTE — Progress Notes (Signed)
Laura Martin - 87 y.o. female MRN [id number]  Date of birth: January 02, 1926  Office Visit Note: Visit Date: 12/20/2020 PCP: Celene Squibb, MD Referred by: Celene Squibb, MD  Subjective: Chief Complaint  Patient presents with   Lower Back - Pain   Right Hip - Pain   HPI:  MAILY Martin is a 87 y.o. female who comes in today at the request of Barnet Pall, FNP for planned Right L4-5 Lumbar Transforaminal epidural steroid injection with fluoroscopic guidance.  The patient has failed conservative care including home exercise, medications, time and activity modification.  This injection will be diagnostic and hopefully therapeutic.  Please see requesting physician notes for further details and justification.   ROS Otherwise per HPI.  Assessment & Plan: Visit Diagnoses:    ICD-10-CM   1. Lumbar radiculopathy  M54.16 XR C-ARM NO REPORT    Epidural Steroid injection    methylPREDNISolone acetate (DEPO-MEDROL) injection 80 mg      Plan: No additional findings.   Meds & Orders:  Meds ordered this encounter  Medications   methylPREDNISolone acetate (DEPO-MEDROL) injection 80 mg    Orders Placed This Encounter  Procedures   XR C-ARM NO REPORT   Epidural Steroid injection    Follow-up: Return if symptoms worsen or fail to improve.   Procedures: No procedures performed  Lumbosacral Transforaminal Epidural Steroid Injection - Sub-Pedicular Approach with Fluoroscopic Guidance  Patient: [name]      Date of Birth: 1926/02/10 MRN: [id number] PCP: Celene Squibb, MD      Visit Date: 12/20/2020   Universal Protocol:    Date/Time: 12/20/2020  Consent Given By: the patient  Position: PRONE  Additional Comments: Vital signs were monitored before and after the procedure. Patient was prepped and draped in the usual sterile fashion. The correct patient, procedure, and site was verified.   Injection Procedure Details:   Procedure diagnoses: Lumbar radiculopathy [M54.16]     Meds Administered:  Meds ordered this encounter  Medications   methylPREDNISolone acetate (DEPO-MEDROL) injection 80 mg    Laterality: Right  Location/Site: L4  Needle:5.0 in., 22 ga.  Short bevel or Quincke spinal needle  Needle Placement: Transforaminal  Findings:    -Comments: Excellent flow of contrast along the nerve, nerve root and into the epidural space.  Procedure Details: After squaring off the end-plates to get a true AP view, the C-arm was positioned so that an oblique view of the foramen as noted above was visualized. The target area is just inferior to the "nose of the scotty dog" or sub pedicular. The soft tissues overlying this structure were infiltrated with 2-3 ml. of 1% Lidocaine without Epinephrine.  The spinal needle was inserted toward the target using a "trajectory" view along the fluoroscope beam.  Under AP and lateral visualization, the needle was advanced so it did not puncture dura and was located close the 6 O'Clock position of the pedical in AP tracterory. Biplanar projections were used to confirm position. Aspiration was confirmed to be negative for CSF and/or blood. A 1-2 ml. volume of Isovue-250 was injected and flow of contrast was noted at each level. Radiographs were obtained for documentation purposes.   After attaining the desired flow of contrast documented above, a 0.5 to 1.0 ml test dose of 0.25% Marcaine was injected into each respective transforaminal space.  The patient was observed for 90 seconds post injection.  After no sensory deficits were reported, and normal lower extremity motor function was  noted,   the above injectate was administered so that equal amounts of the injectate were placed at each foramen (level) into the transforaminal epidural space.   Additional Comments:  No complications occurred Dressing: 2 x 2 sterile gauze and Band-Aid    Post-procedure details: Patient was observed during the procedure. Post-procedure  instructions were reviewed.  Patient left the clinic in stable condition.    Clinical History: MRI LUMBAR SPINE WITHOUT CONTRAST 11/21/2009   Technique: Multiplanar and multiecho pulse sequences of the lumbar spine were obtained without intravenous contrast.   Comparison: None   Findings: Through the scan extends from T11-12 through the second sacral segment. The tip of the conus is at L1-2 and appears normal. The individual disc spaces are examined as follows:   T11-12: There is a tiny focal bulge of the disc to the right of midline with no significant impingement.   T12-L1 through L2-3: No significant abnormalities.   L3-4: There is mild left facet joint arthritis and ligamentum flavum hypertrophy as well as a right far lateral bulge of the disc without significant impingement. There is mild narrowing of the spinal canal.   L4-5: There is marked hypertrophy of the ligamenta flava and facet joints with a 5 mm spondylolisthesis of L4-L5 with slight bulging of the uncovered disc.   This causes severe spinal stenosis with almost complete obliteration of the thecal sac with particularly severe compression of the left side of the thecal sac and left lateral recess.   L5-S1: There is a far lateral bulge of the disc in and lateral to the left neural foramen without impingement upon the exiting L5 nerve root.   IMPRESSION:   1. Severe spinal stenosis at L4-5 due to hypertrophy of the ligamenta flava and facet joints and a 5 mm spondylolisthesis. The spinal canal is almost occluded. 2. No other significant abnormalities.     Objective:  VS:  HT:    WT:   BMI:     BP:(!) 162/68  HR:74bpm  TEMP: ( )  RESP:  Physical Exam Vitals and nursing note reviewed.  Constitutional:      General: She is not in acute distress.    Appearance: Normal appearance. She is not ill-appearing.  HENT:     Head: Normocephalic and atraumatic.     Right Ear: External ear normal.     Left  Ear: External ear normal.  Eyes:     Extraocular Movements: Extraocular movements intact.  Cardiovascular:     Rate and Rhythm: Normal rate.     Pulses: Normal pulses.  Pulmonary:     Effort: Pulmonary effort is normal. No respiratory distress.  Abdominal:     General: There is no distension.     Palpations: Abdomen is soft.  Musculoskeletal:        General: Tenderness present.     Cervical back: Neck supple.     Right lower leg: No edema.     Left lower leg: No edema.     Comments: Patient has good distal strength with no pain over the greater trochanters.  No clonus or focal weakness.  Skin:    Findings: No erythema, lesion or rash.  Neurological:     General: No focal deficit present.     Mental Status: She is alert and oriented to person, place, and time.     Sensory: No sensory deficit.     Motor: No weakness or abnormal muscle tone.     Coordination: Coordination normal.  Psychiatric:  Mood and Affect: Mood normal.        Behavior: Behavior normal.     Imaging: No results found.

## 2020-12-21 NOTE — Procedures (Signed)
Lumbosacral Transforaminal Epidural Steroid Injection - Sub-Pedicular Approach with Fluoroscopic Guidance  Patient: [place]      Date of Birth: Dec 13, 1925 MRN: [id number] PCP: Celene Squibb, MD      Visit Date: 12/20/2020   Universal Protocol:    Date/Time: 12/20/2020  Consent Given By: the patient  Position: PRONE  Additional Comments: Vital signs were monitored before and after the procedure. Patient was prepped and draped in the usual sterile fashion. The correct patient, procedure, and site was verified.   Injection Procedure Details:   Procedure diagnoses: Lumbar radiculopathy [M54.16]    Meds Administered:  Meds ordered this encounter  Medications   methylPREDNISolone acetate (DEPO-MEDROL) injection 80 mg    Laterality: Right  Location/Site: L4  Needle:5.0 in., 22 ga.  Short bevel or Quincke spinal needle  Needle Placement: Transforaminal  Findings:    -Comments: Excellent flow of contrast along the nerve, nerve root and into the epidural space.  Procedure Details: After squaring off the end-plates to get a true AP view, the C-arm was positioned so that an oblique view of the foramen as noted above was visualized. The target area is just inferior to the "nose of the scotty dog" or sub pedicular. The soft tissues overlying this structure were infiltrated with 2-3 ml. of 1% Lidocaine without Epinephrine.  The spinal needle was inserted toward the target using a "trajectory" view along the fluoroscope beam.  Under AP and lateral visualization, the needle was advanced so it did not puncture dura and was located close the 6 O'Clock position of the pedical in AP tracterory. Biplanar projections were used to confirm position. Aspiration was confirmed to be negative for CSF and/or blood. A 1-2 ml. volume of Isovue-250 was injected and flow of contrast was noted at each level. Radiographs were obtained for documentation purposes.   After attaining the desired flow  of contrast documented above, a 0.5 to 1.0 ml test dose of 0.25% Marcaine was injected into each respective transforaminal space.  The patient was observed for 90 seconds post injection.  After no sensory deficits were reported, and normal lower extremity motor function was noted,   the above injectate was administered so that equal amounts of the injectate were placed at each foramen (level) into the transforaminal epidural space.   Additional Comments:  No complications occurred Dressing: 2 x 2 sterile gauze and Band-Aid    Post-procedure details: Patient was observed during the procedure. Post-procedure instructions were reviewed.  Patient left the clinic in stable condition.

## 2021-01-07 ENCOUNTER — Telehealth: Payer: Self-pay | Admitting: Physical Medicine and Rehabilitation

## 2021-01-07 DIAGNOSIS — M5416 Radiculopathy, lumbar region: Secondary | ICD-10-CM

## 2021-01-07 NOTE — Telephone Encounter (Signed)
Pt's daughter Gregary Signs calling requesting a call back. Pt received injection and it is not working. Please call pt's daughter Gregary Signs at [phone].

## 2021-01-07 NOTE — Telephone Encounter (Signed)
Right L4 TF on 9/29. Ok to order CT lumbar as mentioned in OV note from 9/27?

## 2021-01-07 NOTE — Telephone Encounter (Signed)
CT ordered and message left to notify daughter.

## 2021-01-15 ENCOUNTER — Other Ambulatory Visit: Payer: Self-pay

## 2021-01-15 ENCOUNTER — Encounter: Payer: Self-pay | Admitting: Internal Medicine

## 2021-01-15 ENCOUNTER — Ambulatory Visit (INDEPENDENT_AMBULATORY_CARE_PROVIDER_SITE_OTHER): Payer: Medicare Other | Admitting: Internal Medicine

## 2021-01-15 VITALS — BP 144/60 | HR 81 | Ht 66.5 in | Wt 132.0 lb

## 2021-01-15 DIAGNOSIS — I10 Essential (primary) hypertension: Secondary | ICD-10-CM | POA: Diagnosis not present

## 2021-01-15 DIAGNOSIS — I442 Atrioventricular block, complete: Secondary | ICD-10-CM

## 2021-01-15 NOTE — Progress Notes (Signed)
HPI Laura Martin returns today for ongoing evaluation and management of her symptomatic 2:1 heart block, status post permanent pacemaker insertion. In the interim she has done well except for problems with worsening pain in her right knee. She is able walk without difficulty except for knee pain. She denies chest pain or shortness of breath. No peripheral edema. She was told she was too old for knee replacement.  Allergies  Allergen Reactions   Codeine Nausea And Vomiting   Tramadol Nausea And Vomiting   Sulfa Antibiotics Rash     Current Outpatient Medications  Medication Sig Dispense Refill   amLODipine (NORVASC) 5 MG tablet Take 5 mg by mouth daily.     Calcium Carbonate (CALCIUM 600 PO) Take 600 mg by mouth daily.      cholecalciferol (VITAMIN D) 400 UNITS TABS tablet Take 400 Units by mouth.     Coenzyme Q10 (CO Q 10) 100 MG CAPS Take 100 mg by mouth daily.     diphenhydrAMINE (BENADRYL) 25 MG tablet Take 25 mg by mouth at bedtime.     donepezil (ARICEPT) 5 MG tablet Take 5 mg by mouth daily.     doxycycline (VIBRAMYCIN) 100 MG capsule Take 1 capsule (100 mg total) by mouth 2 (two) times daily. 20 capsule 0   fish oil-omega-3 fatty acids 1000 MG capsule Take 1 g by mouth daily.     mupirocin ointment (BACTROBAN) 2 % Apply 1 application topically 2 (two) times daily. 30 g 0   No current facility-administered medications for this visit.     Past Medical History:  Diagnosis Date   Complete heart block ([place])    a. s/p PPM 12/31/13:  Medtronic Adapta L (serial number [id number]) pacemaker   Essential hypertension    Goiter    Mixed hyperlipidemia    Spinal stenosis    Thyroid nodule     ROS:   All systems reviewed and negative except as noted in the HPI.   Past Surgical History:  Procedure Laterality Date   BIOPSY THYROID Left 11/2006   BREAST BIOPSY Right 1990's   "calcium deposit"   CATARACT EXTRACTION W/ INTRAOCULAR LENS  IMPLANT, BILATERAL Bilateral 2001    DILATION AND CURETTAGE OF UTERUS     S/P miscarriage   entropion  11/04/2017   right eye   ESOPHAGEAL DILATION N/A 03/08/2015   Procedure: ESOPHAGEAL DILATION;  Surgeon: Rogene Houston, MD;  Location: [facility];  Service: Endoscopy;  Laterality: N/A;   ESOPHAGOGASTRODUODENOSCOPY N/A 03/08/2015   Procedure: ESOPHAGOGASTRODUODENOSCOPY (EGD);  Surgeon: Rogene Houston, MD;  Location: [facility];  Service: Endoscopy;  Laterality: N/A;  3:00   INSERT / REPLACE / REMOVE PACEMAKER  12/30/2013   KNEE ARTHROSCOPY Left 1986   PERMANENT PACEMAKER INSERTION N/A 12/30/2013   Procedure: PERMANENT PACEMAKER INSERTION;  Surgeon: Evans Lance, MD;  Location: [facility] C[facility] LAB;  Service: Cardiovascular;  Laterality: N/A;   SMALL BOWEL REPAIR N/A 11/05/2019   Procedure: SMALL BOWEL REPAIR;  Surgeon: Aviva Signs, MD;  Location: [facility] ORS;  Service: General;  Laterality: N/A;   TONSILLECTOMY  ~ 1943   TOTAL KNEE ARTHROPLASTY Left 09/2010     Family History  Problem Relation Age of Onset   Cancer Other    Stroke Other      Social History   Socioeconomic History   Marital status: Married    Spouse name: Not on file   Number of children: Not on file  Years of education: Not on file   Highest education level: Not on file  Occupational History   Not on file  Tobacco Use   Smoking status: Never   Smokeless tobacco: Never  Vaping Use   Vaping Use: Never used  Substance and Sexual Activity   Alcohol use: No    Alcohol/week: 0.0 standard drinks   Drug use: No   Sexual activity: Not on file  Other Topics Concern   Not on file  Social History Narrative   Not on file   Social Determinants of Health   Financial Resource Strain: Not on file  Food Insecurity: Not on file  Transportation Needs: Not on file  Physical Activity: Not on file  Stress: Not on file  Social Connections: Not on file  Intimate Partner Violence: Not on file     BP (!) 144/60   Pulse 81   Ht 5' 6.5" (1.689 m)   Wt  132 lb (59.9 kg)   BMI 20.99 kg/m   Physical Exam:  Well appearing NAD HEENT: Unremarkable Neck:  No JVD, no thyromegally Lymphatics:  No adenopathy Back:  No CVA tenderness Lungs:  Clear with no wheezes HEART:  Regular rate rhythm, no murmurs, no rubs, no clicks Abd:  soft, positive bowel sounds, no organomegally, no rebound, no guarding Ext:  2 plus pulses, no edema, no cyanosis, no clubbing Skin:  No rashes no nodules Neuro:  CN II through XII intact, motor grossly intact  EKG - nsr with ventricular pacing  DEVICE  Normal device function.  See PaceArt for details.   Assess/Plan:  1. CHB - she has 2:1 conduction today. She is asymptomatic,s/p PPM. 2. PPM - her Medtronic DDD PM is working normally. 3. HTN  - her systolic pressure is up today. She will continue her current meds. I asked her to reduce her sodium intake.     Mikle [email or web address].D.

## 2021-01-15 NOTE — Patient Instructions (Signed)
Medication Instructions:  Your physician recommends that you continue on your current medications as directed. Please refer to the Current Medication list given to you today.  *If you need a refill on your cardiac medications before your next appointment, please call your pharmacy*   Lab Work: NONE   If you have labs (blood work) drawn today and your tests are completely normal, you will receive your results only by: . MyChart Message (if you have MyChart) OR . A paper copy in the mail If you have any lab test that is abnormal or we need to change your treatment, we will call you to review the results.   Testing/Procedures: NONE    Follow-Up: At [facility], you and your health needs are our priority.  As part of our continuing mission to provide you with exceptional heart care, we have created designated Provider Care Teams.  These Care Teams include your primary Cardiologist (physician) and Advanced Practice Providers (APPs -  Physician Assistants and Nurse Practitioners) who all work together to provide you with the care you need, when you need it.  We recommend signing up for the patient portal called "MyChart".  Sign up information is provided on this After Visit Summary.  MyChart is used to connect with patients for Virtual Visits (Telemedicine).  Patients are able to view lab/test results, encounter notes, upcoming appointments, etc.  Non-urgent messages can be sent to your provider as well.   To learn more about what you can do with MyChart, go to [email or web address].    Your next appointment:   1 year(s)  The format for your next appointment:   In Person  Provider:   Gregg Taylor, MD   Other Instructions Thank you for choosing [facility]!    

## 2021-01-22 ENCOUNTER — Ambulatory Visit (INDEPENDENT_AMBULATORY_CARE_PROVIDER_SITE_OTHER): Payer: Medicare Other

## 2021-01-22 DIAGNOSIS — I442 Atrioventricular block, complete: Secondary | ICD-10-CM | POA: Diagnosis not present

## 2021-01-22 LAB — CUP PACEART REMOTE DEVICE CHECK
Battery Impedance: 749 Ohm
Battery Remaining Longevity: 68 mo
Battery Voltage: 2.79 V
Brady Statistic AP VP Percent: 27 %
Brady Statistic AP VS Percent: 0 %
Brady Statistic AS VP Percent: 73 %
Brady Statistic AS VS Percent: 1 %
Date Time Interrogation Session: 20221101092142
Implantable Lead Implant Date: 20151009
Implantable Lead Implant Date: 20151009
Implantable Lead Location: 753859
Implantable Lead Location: 753860
Implantable Lead Model: 5076
Implantable Lead Model: 5076
Implantable Pulse Generator Implant Date: 20151009
Lead Channel Impedance Value: 457 Ohm
Lead Channel Impedance Value: 558 Ohm
Lead Channel Pacing Threshold Amplitude: 0.375 V
Lead Channel Pacing Threshold Amplitude: 0.875 V
Lead Channel Pacing Threshold Pulse Width: 0.4 ms
Lead Channel Pacing Threshold Pulse Width: 0.4 ms
Lead Channel Setting Pacing Amplitude: 2 V
Lead Channel Setting Pacing Amplitude: 2.5 V
Lead Channel Setting Pacing Pulse Width: 0.4 ms
Lead Channel Setting Sensing Sensitivity: 4 mV

## 2021-01-29 NOTE — Progress Notes (Signed)
Remote pacemaker transmission.   

## 2021-02-05 ENCOUNTER — Other Ambulatory Visit: Payer: Self-pay

## 2021-02-05 ENCOUNTER — Ambulatory Visit (HOSPITAL_COMMUNITY)
Admission: RE | Admit: 2021-02-05 | Discharge: 2021-02-05 | Disposition: A | Discharge status: Home / Self Care | Payer: Medicare Other | Source: Ambulatory Visit | Attending: Physical Medicine and Rehabilitation | Admitting: Physical Medicine and Rehabilitation

## 2021-02-05 DIAGNOSIS — M2578 Osteophyte, vertebrae: Secondary | ICD-10-CM | POA: Diagnosis not present

## 2021-02-05 DIAGNOSIS — M5416 Radiculopathy, lumbar region: Secondary | ICD-10-CM | POA: Insufficient documentation

## 2021-02-05 DIAGNOSIS — M545 Low back pain, unspecified: Secondary | ICD-10-CM | POA: Diagnosis not present

## 2021-02-05 DIAGNOSIS — M8588 Other specified disorders of bone density and structure, other site: Secondary | ICD-10-CM | POA: Diagnosis not present

## 2021-02-06 ENCOUNTER — Telehealth: Payer: Self-pay | Admitting: Physical Medicine and Rehabilitation

## 2021-02-06 DIAGNOSIS — M48062 Spinal stenosis, lumbar region with neurogenic claudication: Secondary | ICD-10-CM

## 2021-02-06 DIAGNOSIS — M5416 Radiculopathy, lumbar region: Secondary | ICD-10-CM

## 2021-02-06 NOTE — Telephone Encounter (Signed)
Referral placed. [employer] supplement- not scheduled.

## 2021-02-26 DIAGNOSIS — Z20822 Contact with and (suspected) exposure to covid-19: Secondary | ICD-10-CM | POA: Diagnosis not present

## 2021-03-04 ENCOUNTER — Ambulatory Visit: Payer: Self-pay

## 2021-03-04 ENCOUNTER — Ambulatory Visit (INDEPENDENT_AMBULATORY_CARE_PROVIDER_SITE_OTHER): Payer: Medicare Other | Admitting: Physical Medicine and Rehabilitation

## 2021-03-04 ENCOUNTER — Other Ambulatory Visit: Payer: Self-pay

## 2021-03-04 ENCOUNTER — Encounter: Payer: Self-pay | Admitting: Physical Medicine and Rehabilitation

## 2021-03-04 VITALS — BP 150/70 | HR 97

## 2021-03-04 DIAGNOSIS — M48062 Spinal stenosis, lumbar region with neurogenic claudication: Secondary | ICD-10-CM

## 2021-03-04 DIAGNOSIS — M5416 Radiculopathy, lumbar region: Secondary | ICD-10-CM

## 2021-03-04 MED ORDER — METHYLPREDNISOLONE ACETATE 80 MG/ML IJ SUSP
80.0000 mg | Freq: Once | INTRAMUSCULAR | Status: AC
  Administered 2021-03-04: 80 mg

## 2021-03-04 NOTE — Patient Instructions (Signed)

## 2021-03-04 NOTE — Progress Notes (Signed)
Pt state lower back pain that travels to her right hip , buttock and leg. Pt state walking and standing makes the pain worse. Pt state she takes over the counter pain meds, pain creams  and uses heating to help ease her pain.  Numeric Pain Rating Scale and Functional Assessment Average Pain 3   In the last MONTH (on 0-10 scale) has pain interfered with the following?  1. General activity like being  able to carry out your everyday physical activities such as walking, climbing stairs, carrying groceries, or moving a chair?  Rating(8)   +Driver, -BT, -Dye Allergies.

## 2021-03-05 DIAGNOSIS — Z20822 Contact with and (suspected) exposure to covid-19: Secondary | ICD-10-CM | POA: Diagnosis not present

## 2021-03-05 NOTE — Procedures (Signed)
Lumbar Epidural Steroid Injection - Interlaminar Approach with Fluoroscopic Guidance  Patient: Laura Martin      Date of Birth: 17-Apr-1925 MRN: [id number] PCP: Celene Squibb, MD      Visit Date: 03/04/2021   Universal Protocol:     Consent Given By: the patient  Position: PRONE  Additional Comments: Vital signs were monitored before and after the procedure. Patient was prepped and draped in the usual sterile fashion. The correct patient, procedure, and site was verified.   Injection Procedure Details:   Procedure diagnoses: Lumbar radiculopathy [M54.16]   Meds Administered:  Meds ordered this encounter  Medications   methylPREDNISolone acetate (DEPO-MEDROL) injection 80 mg     Laterality: Right  Location/Site:  L5-S1  Needle: 3.5 in., 20 ga. Tuohy  Needle Placement: Paramedian epidural  Findings:   -Comments: Excellent flow of contrast into the epidural space.  Procedure Details: Using a paramedian approach from the side mentioned above, the region overlying the inferior lamina was localized under fluoroscopic visualization and the soft tissues overlying this structure were infiltrated with 4 ml. of 1% Lidocaine without Epinephrine. The Tuohy needle was inserted into the epidural space using a paramedian approach.   The epidural space was localized using loss of resistance along with counter oblique bi-planar fluoroscopic views.  After negative aspirate for air, blood, and CSF, a 2 ml. volume of Isovue-250 was injected into the epidural space and the flow of contrast was observed. Radiographs were obtained for documentation purposes.    The injectate was administered into the level noted above.   Additional Comments:  The patient tolerated the procedure well Dressing: 2 x 2 sterile gauze and Band-Aid    Post-procedure details: Patient was observed during the procedure. Post-procedure instructions were reviewed.  Patient left the clinic in stable condition.

## 2021-03-05 NOTE — Progress Notes (Signed)
[name] - 87 y.o. female MRN [id number]  Date of birth: 02-14-1926  Office Visit Note: Visit Date: 03/04/2021 PCP: Celene Squibb, MD Referred by: Celene Squibb, MD  Subjective: Chief Complaint  Patient presents with   Lower Back - Pain   Right Leg - Pain   Right Hip - Pain   HPI:  Laura Martin is a 87 y.o. female who comes in today at the request of Barnet Pall, FNP for planned Right L5-S1 Lumbar Interlaminar epidural steroid injection with fluoroscopic guidance.  The patient has failed conservative care including home exercise, medications, time and activity modification.  This injection will be diagnostic and hopefully therapeutic.  Please see requesting physician notes for further details and justification.  Patient returns today after CT scan of the lumbar spine.  This is reviewed with the patient and her daughter.  She continues to have multilevel severe central stenosis and severe degenerative facet arthropathy and changes consistent with her age at 87.  She has had known stenosis for quite some time.  No concerning factors on the CT scan.  We wanted to update this since it has not been done in quite a while.  She is still having right L5 radicular complaints.  We will complete a right L5-S1 interlaminar injection today to see how she does.  Consideration for multilevel transforaminal approach in the future if needed.  ROS Otherwise per HPI.  Assessment & Plan: Visit Diagnoses:    ICD-10-CM   1. Lumbar radiculopathy  M54.16 XR C-ARM NO REPORT    Epidural Steroid injection    methylPREDNISolone acetate (DEPO-MEDROL) injection 80 mg    2. Spinal stenosis of lumbar region with neurogenic claudication  M48.062 XR C-ARM NO REPORT    Epidural Steroid injection    methylPREDNISolone acetate (DEPO-MEDROL) injection 80 mg      Plan: No additional findings.   Meds & Orders:  Meds ordered this encounter  Medications   methylPREDNISolone acetate (DEPO-MEDROL) injection 80  mg    Orders Placed This Encounter  Procedures   XR C-ARM NO REPORT   Epidural Steroid injection    Follow-up: Return if symptoms worsen or fail to improve.   Procedures: No procedures performed  Lumbar Epidural Steroid Injection - Interlaminar Approach with Fluoroscopic Guidance  Patient: Laura Martin      Date of Birth: [birth year] MRN: [id number] PCP: Celene Squibb, MD      Visit Date: 03/04/2021   Universal Protocol:     Consent Given By: the patient  Position: PRONE  Additional Comments: Vital signs were monitored before and after the procedure. Patient was prepped and draped in the usual sterile fashion. The correct patient, procedure, and site was verified.   Injection Procedure Details:   Procedure diagnoses: Lumbar radiculopathy [M54.16]   Meds Administered:  Meds ordered this encounter  Medications   methylPREDNISolone acetate (DEPO-MEDROL) injection 80 mg     Laterality: Right  Location/Site:  L5-S1  Needle: 3.5 in., 20 ga. Tuohy  Needle Placement: Paramedian epidural  Findings:   -Comments: Excellent flow of contrast into the epidural space.  Procedure Details: Using a paramedian approach from the side mentioned above, the region overlying the inferior lamina was localized under fluoroscopic visualization and the soft tissues overlying this structure were infiltrated with 4 ml. of 1% Lidocaine without Epinephrine. The Tuohy needle was inserted into the epidural space using a paramedian approach.   The epidural space was localized using loss of resistance along  with counter oblique bi-planar fluoroscopic views.  After negative aspirate for air, blood, and CSF, a 2 ml. volume of Isovue-250 was injected into the epidural space and the flow of contrast was observed. Radiographs were obtained for documentation purposes.    The injectate was administered into the level noted above.   Additional Comments:  The patient tolerated the procedure  well Dressing: 2 x 2 sterile gauze and Band-Aid    Post-procedure details: Patient was observed during the procedure. Post-procedure instructions were reviewed.  Patient left the clinic in stable condition.    Clinical History: CT LUMBAR SPINE WITHOUT CONTRAST   FINDINGS:   Alignment: Again noted is mild levo rotary scoliosis, apex L2-3, a slight chronic retrolisthesis T12-L1, L1-2, and a 5 mm grade 1 anterolisthesis of L4 on 5, all of which are unchanged.   Vertebrae: There is osteopenia. The vertebra are normal in heights without evidence of fractures. There are prominent anterior bridging osteophytes at T11-T12 and T12-L1, left lateral prominent osteophytes with attempted bridging at L1-2, L2-3 and L5-S1, and prominent marginal osteophytes without bridging at the remaining levels.   There is spinous process abutment with opposing surface spurring and sclerosis from L2-5, consistent with Baastrup's disease. Both SI joints are patent with spurring and vacuum phenomenon. Dural ectasia expands the S2 sacral foramina, unchanged.   Disc levels:     T12-L1: A small calcified right paracentral disc herniation indents the ventral thecal sac, unchanged. There is no other herniation and no canal stenosis. The foramina are clear, with trace facet spurring.   L1-2: There is moderate disc space loss and vacuum phenomenon. Broad-based posterior disc protrusion is seen with calcifications and mild spinal canal stenosis with lateral recess effacement. There is mild facet hypertrophy and inferior foraminal disc bulging with bilateral mild foraminal stenosis   L2-3: There is mild disc space loss with vacuum phenomenon. Due to a broad posterior disc bulge, dorsal ligamentous thickening and facet hypertrophy, this level demonstrates lateral recess effacement and moderate to severe spinal canal stenosis. Inferior foraminal disc bulging and facet spurring cause mild-to-moderate  foraminal stenosis.   L3-4: There is mild disc space loss with vacuum phenomenon. There is circumferential disc osteophyte complex, dorsal ligamentous thickening and moderate facet hypertrophy in combination causing severe spinal canal stenosis with lateral recess effacement. Facet spurs and foraminal disc bulging cause bilateral moderate foraminal stenosis.   L4-5: This disc is normal in height but but with vacuum phenomenon again noted. As above there is a grade 1 spondylolisthesis. Due to advanced facet hypertrophy, ligamentous thickening and chronic posterior disc protrusion there is markedly severe spinal canal stenosis. Inferior foraminal disc bulging and facet spurs cause moderate to severe right and moderate left foraminal stenosis.   L5-S1: The disc is normal in height to the right with partial disc space loss to the left. There is a broad-based posterior disc bulge and left paracentral chronic calcified disc protrusion with at least mild mass effect on the left S1 nerve root. There is no frank spinal canal AP stenosis. Spondylosis and moderate facet hypertrophy are present with mild right and moderate left foraminal stenosis.   IMPRESSION: 1. Osteopenia, levorotary scoliosis, 3 levels with a degenerative type listhesis which is unchanged from the prior abdomen and pelvis CT, and multilevel spinous process abutment and opposing surface spurring and sclerosis. 2. Advanced marginal osteophytosis with multilevel bridging and attempted bridging. 3. Multilevel acquired spinal canal and foraminal stenosis with spinal canal stenosis greatest at L2-3, L3-4 and L4-5. 4.  T12-L1 chronic right paracentral calcified disc extrusion, without stenosis, with L5-S1 left paracentral calcified herniated disc with at least mild mass effect on the left S1 nerve root. 5. Dural ectasia expanding the S2 sacral foramina, without evidence of pathologic fracture or interval change. 6. Additional  detailed findings as above.     Electronically Signed   By: Telford Nab M.D.   On: 02/05/2021 20:45 ---- MRI LUMBAR SPINE WITHOUT CONTRAST 11/21/2009  IMPRESSION:  1. Severe spinal stenosis at L4-5 due to hypertrophy of the ligamenta flava and facet joints and a 5 mm spondylolisthesis. The spinal canal is almost occluded. 2. No other significant abnormalities.     Objective:  VS:  HT:     WT:    BMI:      BP:(!) 150/70   HR:97bpm   TEMP: ( )   RESP:  Physical Exam Vitals and nursing note reviewed.  Constitutional:      General: She is not in acute distress.    Appearance: Normal appearance. She is not ill-appearing.  HENT:     Head: Normocephalic and atraumatic.     Right Ear: External ear normal.     Left Ear: External ear normal.  Eyes:     Extraocular Movements: Extraocular movements intact.  Cardiovascular:     Rate and Rhythm: Normal rate.     Pulses: Normal pulses.  Pulmonary:     Effort: Pulmonary effort is normal. No respiratory distress.  Abdominal:     General: There is no distension.     Palpations: Abdomen is soft.  Musculoskeletal:        General: Tenderness present.     Cervical back: Neck supple.     Right lower leg: No edema.     Left lower leg: No edema.     Comments: Patient has good distal strength with no pain over the greater trochanters.  No clonus or focal weakness.  Skin:    Findings: No erythema, lesion or rash.  Neurological:     General: No focal deficit present.     Mental Status: She is alert and oriented to person, place, and time.     Sensory: No sensory deficit.     Motor: No weakness or abnormal muscle tone.     Coordination: Coordination normal.  Psychiatric:        Mood and Affect: Mood normal.        Behavior: Behavior normal.     Imaging: XR C-ARM NO REPORT  Result Date: 03/04/2021 Please see Notes tab for imaging impression.

## 2021-03-21 DIAGNOSIS — E559 Vitamin D deficiency, unspecified: Secondary | ICD-10-CM | POA: Diagnosis not present

## 2021-03-21 DIAGNOSIS — I10 Essential (primary) hypertension: Secondary | ICD-10-CM | POA: Diagnosis not present

## 2021-03-21 DIAGNOSIS — E539 Vitamin B deficiency, unspecified: Secondary | ICD-10-CM | POA: Diagnosis not present

## 2021-03-21 DIAGNOSIS — Z79899 Other long term (current) drug therapy: Secondary | ICD-10-CM | POA: Diagnosis not present

## 2021-03-28 DIAGNOSIS — E539 Vitamin B deficiency, unspecified: Secondary | ICD-10-CM | POA: Diagnosis not present

## 2021-03-28 DIAGNOSIS — E559 Vitamin D deficiency, unspecified: Secondary | ICD-10-CM | POA: Diagnosis not present

## 2021-03-28 DIAGNOSIS — I10 Essential (primary) hypertension: Secondary | ICD-10-CM | POA: Diagnosis not present

## 2021-03-28 DIAGNOSIS — R809 Proteinuria, unspecified: Secondary | ICD-10-CM | POA: Diagnosis not present

## 2021-03-28 DIAGNOSIS — G3184 Mild cognitive impairment, so stated: Secondary | ICD-10-CM | POA: Diagnosis not present

## 2021-03-28 DIAGNOSIS — E782 Mixed hyperlipidemia: Secondary | ICD-10-CM | POA: Diagnosis not present

## 2021-03-28 DIAGNOSIS — Z950 Presence of cardiac pacemaker: Secondary | ICD-10-CM | POA: Diagnosis not present

## 2021-04-01 DIAGNOSIS — Z20822 Contact with and (suspected) exposure to covid-19: Secondary | ICD-10-CM | POA: Diagnosis not present

## 2021-04-02 DIAGNOSIS — Z20822 Contact with and (suspected) exposure to covid-19: Secondary | ICD-10-CM | POA: Diagnosis not present

## 2021-04-17 DIAGNOSIS — Z20822 Contact with and (suspected) exposure to covid-19: Secondary | ICD-10-CM | POA: Diagnosis not present

## 2021-04-18 ENCOUNTER — Ambulatory Visit: Payer: Medicare Other | Admitting: Cardiology

## 2021-04-23 ENCOUNTER — Ambulatory Visit (INDEPENDENT_AMBULATORY_CARE_PROVIDER_SITE_OTHER): Payer: Medicare Other

## 2021-04-23 DIAGNOSIS — I442 Atrioventricular block, complete: Secondary | ICD-10-CM | POA: Diagnosis not present

## 2021-04-23 LAB — CUP PACEART REMOTE DEVICE CHECK
Battery Impedance: 800 Ohm
Battery Remaining Longevity: 66 mo
Battery Voltage: 2.79 V
Brady Statistic AP VP Percent: 31 %
Brady Statistic AP VS Percent: 0 %
Brady Statistic AS VP Percent: 69 %
Brady Statistic AS VS Percent: 0 %
Date Time Interrogation Session: 20230131111123
Implantable Lead Implant Date: 20151009
Implantable Lead Implant Date: 20151009
Implantable Lead Location: 753859
Implantable Lead Location: 753860
Implantable Lead Model: 5076
Implantable Lead Model: 5076
Implantable Pulse Generator Implant Date: 20151009
Lead Channel Impedance Value: 463 Ohm
Lead Channel Impedance Value: 576 Ohm
Lead Channel Pacing Threshold Amplitude: 0.375 V
Lead Channel Pacing Threshold Amplitude: 0.5 V
Lead Channel Pacing Threshold Pulse Width: 0.4 ms
Lead Channel Pacing Threshold Pulse Width: 0.4 ms
Lead Channel Setting Pacing Amplitude: 2 V
Lead Channel Setting Pacing Amplitude: 2.5 V
Lead Channel Setting Pacing Pulse Width: 0.4 ms
Lead Channel Setting Sensing Sensitivity: 4 mV

## 2021-04-30 NOTE — Progress Notes (Signed)
Cardiology Office Note    Date:  05/01/2021   ID:  Laura Martin, DOB 1925/06/19, MRN [id number]  PCP:  Celene Squibb, MD  Cardiologist: Rozann Lesches, MD   EP: Dr. Lovena Le  Chief Complaint  Patient presents with   Follow-up    Annual Visit    History of Present Illness:    Laura Martin is a 88 y.o. female with past medical history of CHB (s/p Medtronic PPM placement in 2015), HTN and HLD who presents to the office today for annual follow-up.   She was examined by Dr. Domenic Polite in 02/2019 and still lived alone and performed ADL's independently. She was continued on her current cardiac medications including Amlodipine and Fish Oil. She did follow-up with Dr. Lovena Le in 12/2020 and her biggest issue at that time was knee pain. Her device was interrogated and working normally.    In talking with the patient and her daughter today, she reports overall doing well since her last office visit. She still remains active at baseline for her age and enjoys walking her dog. She denies any recent chest pain or dyspnea on exertion. No recent orthopnea, PND or lower extremity edema. She does report occasional palpitations which occur sporadically and spontaneously resolve. She does consume several cups of coffee throughout the day.  Her daughter lives close by and helps out with the care of the patient and her husband.   Past Medical History:  Diagnosis Date   Complete heart block ([place])    a. s/p PPM 12/31/13:  Medtronic Adapta L (serial number [id number]) pacemaker   Essential hypertension    Goiter    Mixed hyperlipidemia    Spinal stenosis    Thyroid nodule     Past Surgical History:  Procedure Laterality Date   BIOPSY THYROID Left 11/2006   BREAST BIOPSY Right 1990's   "calcium deposit"   CATARACT EXTRACTION W/ INTRAOCULAR LENS  IMPLANT, BILATERAL Bilateral 2001   DILATION AND CURETTAGE OF UTERUS     S/P miscarriage   entropion  11/04/2017   right eye   ESOPHAGEAL DILATION N/A  03/08/2015   Procedure: ESOPHAGEAL DILATION;  Surgeon: Rogene Houston, MD;  [facility];  Service: Endoscopy;  Laterality: N/A;   ESOPHAGOGASTRODUODENOSCOPY N/A 03/08/2015   Procedure: ESOPHAGOGASTRODUODENOSCOPY (EGD);  Surgeon: Rogene Houston, MD;  [facility];  Service: Endoscopy;  Laterality: N/A;  3:00   INSERT / REPLACE / REMOVE PACEMAKER  12/30/2013   KNEE ARTHROSCOPY Left 1986   PERMANENT PACEMAKER INSERTION N/A 12/30/2013   Procedure: PERMANENT PACEMAKER INSERTION;  Surgeon: Evans Lance, MD;  Location[facility] CATH LAB;  Service: Cardiovascular;  Laterality: N/A;   SMALL BOWEL REPAIR N/A 11/05/2019   Procedure: SMALL BOWEL REPAIR;  Surgeon: Aviva Signs, MD;  Location: [facility] ORS;  Service: General;  Laterality: N/A;   TONSILLECTOMY  ~ [place] Left 09/2010    Current Medications: Outpatient Medications Prior to Visit  Medication Sig Dispense Refill   Calcium Carbonate (CALCIUM 600 PO) Take 600 mg by mouth daily.      cholecalciferol (VITAMIN D) 400 UNITS TABS tablet Take 400 Units by mouth.     Coenzyme Q10 (CO Q 10) 100 MG CAPS Take 100 mg by mouth daily.     diphenhydrAMINE (BENADRYL) 25 MG tablet Take 25 mg by mouth at bedtime.     donepezil (ARICEPT) 5 MG tablet Take 5 mg by mouth daily.  fish oil-omega-3 fatty acids 1000 MG capsule Take 1 g by mouth daily.     mupirocin ointment (BACTROBAN) 2 % Apply 1 application topically 2 (two) times daily. 30 g 0   amLODipine (NORVASC) 5 MG tablet Take 5 mg by mouth daily.     doxycycline (VIBRAMYCIN) 100 MG capsule Take 1 capsule (100 mg total) by mouth 2 (two) times daily. 20 capsule 0   No facility-administered medications prior to visit.     Allergies:   Codeine, Tramadol, and Sulfa antibiotics   Social History   Socioeconomic History   Marital status: Married    Spouse name: Not on file   Number of children: Not on file   Years of education: Not on file   Highest education level:  Not on file  Occupational History   Not on file  Tobacco Use   Smoking status: Never   Smokeless tobacco: Never  Vaping Use   Vaping Use: Never used  Substance and Sexual Activity   Alcohol use: No    Alcohol/week: 0.0 standard drinks   Drug use: No   Sexual activity: Not on file  Other Topics Concern   Not on file  Social History Narrative   Not on file   Social Determinants of Health   Financial Resource Strain: Not on file  Food Insecurity: Not on file  Transportation Needs: Not on file  Physical Activity: Not on file  Stress: Not on file  Social Connections: Not on file     Family History:  The patient's family history includes Cancer in an other family member; Stroke in an other family member.   Review of Systems:    Please see the history of present illness.     All other systems reviewed and are otherwise negative except as noted above.   Physical Exam:    VS:  BP 140/66    Pulse 77    Ht 5\' 7"  (1.702 m)    Wt 134 lb 9.6 oz (61.1 kg)    SpO2 97%    BMI 21.08 kg/m    General: Pleasant elderly female appearing in no acute distress. Head: Normocephalic, atraumatic. Neck: No carotid bruits. JVD not elevated.  Lungs: Respirations regular and unlabored, without wheezes or rales.  Heart: Regular rate and rhythm. No S3 or S4.  No murmur, no rubs, or gallops appreciated. Abdomen: Appears non-distended. No obvious abdominal masses. Msk:  Strength and tone appear normal for age. No obvious joint deformities or effusions. Extremities: No clubbing or cyanosis. No pitting edema.  Distal pedal pulses are 2+ bilaterally. Neuro: Alert and oriented X 3. Moves all extremities spontaneously. No focal deficits noted. Psych:  Responds to questions appropriately with a normal affect. Skin: No rashes or lesions noted  Wt Readings from Last 3 Encounters:  05/01/21 134 lb 9.6 oz (61.1 kg)  01/15/21 132 lb (59.9 kg)  11/17/19 127 lb 9.6 oz (57.9 kg)     Studies/Labs Reviewed:    EKG:  EKG is not ordered today.    Recent Labs: No results found for requested labs within last 8760 hours.   Lipid Panel    Component Value Date/Time   CHOL 202 (H) 04/26/2019 0829   TRIG 119 04/26/2019 0829   HDL 61 04/26/2019 0829   CHOLHDL 3.3 04/26/2019 0829   LDLCALC 118 (H) 04/26/2019 0829    Additional studies/ records that were reviewed today include:   Echocardiogram: 10/2013 Study Conclusions   - Left ventricle: The cavity size  was normal. Wall thickness was    increased in a pattern of mild LVH. Systolic function was normal.    The estimated ejection fraction was in the range of 60% to 65%.    Wall motion was normal; there were no regional wall motion    abnormalities. Doppler parameters are consistent with abnormal    left ventricular relaxation (grade 1 diastolic dysfunction).    Doppler parameters are consistent with high ventricular filling    pressure.  - Mitral valve: Mildly thickened leaflets . There was trivial    regurgitation.  - Tricuspid valve: There was mild regurgitation.  - Pulmonic valve: There was mild regurgitation.  - Pulmonary arteries: PA peak pressure: 33 mm Hg (S).    Assessment:    1. Complete heart block ([place])   2. Essential hypertension, benign   3. Mixed hyperlipidemia      Plan:   In order of problems listed above:  1. CHB - She is s/p Medtronic PPM placement in 2015 which is followed by Dr. Lovena Le. Most recent interrogation last month showed normal device function.   2. HTN - BP is at 140/66 during today's visit and she reports similar readings at home. Would not aggressively treat in the setting of her advanced age. Continue Amlodipine 5mg  daily.   3. HLD - Followed by her PCP. Remains on Fish Oil only given her age. Will request a copy of most recent labs from her PCP.   Medication Adjustments/Labs and Tests Ordered: Current medicines are reviewed at length with the patient today.  Concerns regarding medicines are  outlined above.  Medication changes, Labs and Tests ordered today are listed in the Patient Instructions below. Patient Instructions  Medication Instructions:   Continue current medication regimen.   Lab Work:  We will request most recent labs from your PCP.   Testing/Procedures:  None  Follow-Up:  Your next appointment:   1 year(s)  The format for your next appointment:   In Person  Provider:   You may see Rozann Lesches, MD or one of the following Advanced Practice Providers on your designated Care Team:   Bernerd Pho, PA-C  Ermalinda Barrios, PA-C {     Signed, Waynetta Pean  05/01/2021 7:15 PM    [place] [address] [place], [place] [address] Phone: [phone] Fax: [phone]

## 2021-05-01 ENCOUNTER — Ambulatory Visit (INDEPENDENT_AMBULATORY_CARE_PROVIDER_SITE_OTHER): Payer: Medicare Other | Admitting: Student

## 2021-05-01 ENCOUNTER — Other Ambulatory Visit: Payer: Self-pay

## 2021-05-01 ENCOUNTER — Encounter: Payer: Self-pay | Admitting: Student

## 2021-05-01 VITALS — BP 140/66 | HR 77 | Ht 67.0 in | Wt 134.6 lb

## 2021-05-01 DIAGNOSIS — E782 Mixed hyperlipidemia: Secondary | ICD-10-CM

## 2021-05-01 DIAGNOSIS — I10 Essential (primary) hypertension: Secondary | ICD-10-CM | POA: Diagnosis not present

## 2021-05-01 DIAGNOSIS — I442 Atrioventricular block, complete: Secondary | ICD-10-CM

## 2021-05-01 MED ORDER — AMLODIPINE BESYLATE 5 MG PO TABS: 5.0000 mg | ORAL_TABLET | Freq: Every day | ORAL | 3 refills | Status: AC

## 2021-05-01 NOTE — Progress Notes (Signed)
Remote pacemaker transmission.   

## 2021-05-01 NOTE — Patient Instructions (Signed)
Medication Instructions:   Continue current medication regimen.   Lab Work:  We will request most recent labs from your PCP.   Testing/Procedures:  None  Follow-Up:  Your next appointment:   1 year(s)  The format for your next appointment:   In Person  Provider:   You may see Rozann Lesches, MD or one of the following Advanced Practice Providers on your designated Care Team:   Bernerd Pho, PA-C  Ermalinda Barrios, [place] {

## 2021-05-10 DIAGNOSIS — Z20822 Contact with and (suspected) exposure to covid-19: Secondary | ICD-10-CM | POA: Diagnosis not present

## 2021-05-16 DIAGNOSIS — Z20822 Contact with and (suspected) exposure to covid-19: Secondary | ICD-10-CM | POA: Diagnosis not present

## 2021-05-20 DIAGNOSIS — Z20822 Contact with and (suspected) exposure to covid-19: Secondary | ICD-10-CM | POA: Diagnosis not present

## 2021-05-23 DIAGNOSIS — Z01812 Encounter for preprocedural laboratory examination: Secondary | ICD-10-CM | POA: Diagnosis not present

## 2021-05-26 DIAGNOSIS — Z20822 Contact with and (suspected) exposure to covid-19: Secondary | ICD-10-CM | POA: Diagnosis not present

## 2021-05-29 DIAGNOSIS — Z20822 Contact with and (suspected) exposure to covid-19: Secondary | ICD-10-CM | POA: Diagnosis not present

## 2021-06-11 DIAGNOSIS — R4189 Other symptoms and signs involving cognitive functions and awareness: Secondary | ICD-10-CM | POA: Diagnosis not present

## 2021-06-11 DIAGNOSIS — Z742 Need for assistance at home and no other household member able to render care: Secondary | ICD-10-CM | POA: Diagnosis not present

## 2021-06-15 DIAGNOSIS — Z20822 Contact with and (suspected) exposure to covid-19: Secondary | ICD-10-CM | POA: Diagnosis not present

## 2021-06-17 DIAGNOSIS — Z20822 Contact with and (suspected) exposure to covid-19: Secondary | ICD-10-CM | POA: Diagnosis not present

## 2021-06-22 DIAGNOSIS — Z20822 Contact with and (suspected) exposure to covid-19: Secondary | ICD-10-CM | POA: Diagnosis not present

## 2021-06-26 DIAGNOSIS — Z20822 Contact with and (suspected) exposure to covid-19: Secondary | ICD-10-CM | POA: Diagnosis not present

## 2021-06-29 DIAGNOSIS — Z20822 Contact with and (suspected) exposure to covid-19: Secondary | ICD-10-CM | POA: Diagnosis not present

## 2021-07-08 DIAGNOSIS — F0390 Unspecified dementia without behavioral disturbance: Secondary | ICD-10-CM | POA: Diagnosis not present

## 2021-07-08 DIAGNOSIS — L0390 Cellulitis, unspecified: Secondary | ICD-10-CM | POA: Diagnosis not present

## 2021-07-10 DIAGNOSIS — Z20828 Contact with and (suspected) exposure to other viral communicable diseases: Secondary | ICD-10-CM | POA: Diagnosis not present

## 2021-07-10 DIAGNOSIS — Z20822 Contact with and (suspected) exposure to covid-19: Secondary | ICD-10-CM | POA: Diagnosis not present

## 2021-07-18 DIAGNOSIS — Z20822 Contact with and (suspected) exposure to covid-19: Secondary | ICD-10-CM | POA: Diagnosis not present

## 2021-07-22 DIAGNOSIS — Z20822 Contact with and (suspected) exposure to covid-19: Secondary | ICD-10-CM | POA: Diagnosis not present

## 2021-07-23 ENCOUNTER — Ambulatory Visit (INDEPENDENT_AMBULATORY_CARE_PROVIDER_SITE_OTHER): Payer: Medicare Other

## 2021-07-23 DIAGNOSIS — I442 Atrioventricular block, complete: Secondary | ICD-10-CM

## 2021-07-24 DIAGNOSIS — M25561 Pain in right knee: Secondary | ICD-10-CM | POA: Diagnosis not present

## 2021-07-25 LAB — CUP PACEART REMOTE DEVICE CHECK
Battery Impedance: 902 Ohm
Battery Remaining Longevity: 61 mo
Battery Voltage: 2.78 V
Brady Statistic AP VP Percent: 32 %
Brady Statistic AP VS Percent: 0 %
Brady Statistic AS VP Percent: 68 %
Brady Statistic AS VS Percent: 0 %
Date Time Interrogation Session: 20230504141024
Implantable Lead Implant Date: 20151009
Implantable Lead Implant Date: 20151009
Implantable Lead Location: 753859
Implantable Lead Location: 753860
Implantable Lead Model: 5076
Implantable Lead Model: 5076
Implantable Pulse Generator Implant Date: 20151009
Lead Channel Impedance Value: 421 Ohm
Lead Channel Impedance Value: 600 Ohm
Lead Channel Pacing Threshold Amplitude: 0.375 V
Lead Channel Pacing Threshold Amplitude: 0.625 V
Lead Channel Pacing Threshold Pulse Width: 0.4 ms
Lead Channel Pacing Threshold Pulse Width: 0.4 ms
Lead Channel Setting Pacing Amplitude: 2 V
Lead Channel Setting Pacing Amplitude: 2.5 V
Lead Channel Setting Pacing Pulse Width: 0.4 ms
Lead Channel Setting Sensing Sensitivity: 4 mV

## 2021-07-27 DIAGNOSIS — Z20822 Contact with and (suspected) exposure to covid-19: Secondary | ICD-10-CM | POA: Diagnosis not present

## 2021-08-07 NOTE — Progress Notes (Signed)
Remote pacemaker transmission.   

## 2021-09-19 DIAGNOSIS — D519 Vitamin B12 deficiency anemia, unspecified: Secondary | ICD-10-CM | POA: Diagnosis not present

## 2021-09-19 DIAGNOSIS — E539 Vitamin B deficiency, unspecified: Secondary | ICD-10-CM | POA: Diagnosis not present

## 2021-09-19 DIAGNOSIS — E782 Mixed hyperlipidemia: Secondary | ICD-10-CM | POA: Diagnosis not present

## 2021-09-19 DIAGNOSIS — I10 Essential (primary) hypertension: Secondary | ICD-10-CM | POA: Diagnosis not present

## 2021-09-25 DIAGNOSIS — Z950 Presence of cardiac pacemaker: Secondary | ICD-10-CM | POA: Diagnosis not present

## 2021-09-25 DIAGNOSIS — F0390 Unspecified dementia without behavioral disturbance: Secondary | ICD-10-CM | POA: Diagnosis not present

## 2021-09-25 DIAGNOSIS — E782 Mixed hyperlipidemia: Secondary | ICD-10-CM | POA: Diagnosis not present

## 2021-09-25 DIAGNOSIS — E559 Vitamin D deficiency, unspecified: Secondary | ICD-10-CM | POA: Diagnosis not present

## 2021-09-25 DIAGNOSIS — M25561 Pain in right knee: Secondary | ICD-10-CM | POA: Diagnosis not present

## 2021-09-25 DIAGNOSIS — E539 Vitamin B deficiency, unspecified: Secondary | ICD-10-CM | POA: Diagnosis not present

## 2021-09-25 DIAGNOSIS — R809 Proteinuria, unspecified: Secondary | ICD-10-CM | POA: Diagnosis not present

## 2021-09-25 DIAGNOSIS — I10 Essential (primary) hypertension: Secondary | ICD-10-CM | POA: Diagnosis not present

## 2021-10-22 ENCOUNTER — Ambulatory Visit (INDEPENDENT_AMBULATORY_CARE_PROVIDER_SITE_OTHER): Payer: Medicare Other

## 2021-10-22 DIAGNOSIS — I442 Atrioventricular block, complete: Secondary | ICD-10-CM

## 2021-10-22 LAB — CUP PACEART REMOTE DEVICE CHECK
Battery Impedance: 954 Ohm
Battery Remaining Longevity: 60 mo
Battery Voltage: 2.79 V
Brady Statistic AP VP Percent: 32 %
Brady Statistic AP VS Percent: 0 %
Brady Statistic AS VP Percent: 68 %
Brady Statistic AS VS Percent: 0 %
Date Time Interrogation Session: 20230801095505
Implantable Lead Implant Date: 20151009
Implantable Lead Implant Date: 20151009
Implantable Lead Location: 753859
Implantable Lead Location: 753860
Implantable Lead Model: 5076
Implantable Lead Model: 5076
Implantable Pulse Generator Implant Date: 20151009
Lead Channel Impedance Value: 432 Ohm
Lead Channel Impedance Value: 576 Ohm
Lead Channel Pacing Threshold Amplitude: 0.375 V
Lead Channel Pacing Threshold Amplitude: 0.75 V
Lead Channel Pacing Threshold Pulse Width: 0.4 ms
Lead Channel Pacing Threshold Pulse Width: 0.4 ms
Lead Channel Setting Pacing Amplitude: 2 V
Lead Channel Setting Pacing Amplitude: 2.5 V
Lead Channel Setting Pacing Pulse Width: 0.4 ms
Lead Channel Setting Sensing Sensitivity: 2.8 mV

## 2021-11-19 NOTE — Progress Notes (Signed)
Remote pacemaker transmission.   

## 2021-12-26 DIAGNOSIS — F330 Major depressive disorder, recurrent, mild: Secondary | ICD-10-CM | POA: Diagnosis not present

## 2022-01-10 DIAGNOSIS — M25512 Pain in left shoulder: Secondary | ICD-10-CM | POA: Diagnosis not present

## 2022-01-10 DIAGNOSIS — M1711 Unilateral primary osteoarthritis, right knee: Secondary | ICD-10-CM | POA: Diagnosis not present

## 2022-01-21 ENCOUNTER — Ambulatory Visit: Payer: Medicare Other | Attending: Internal Medicine

## 2022-01-21 DIAGNOSIS — I442 Atrioventricular block, complete: Secondary | ICD-10-CM

## 2022-01-21 DIAGNOSIS — Z950 Presence of cardiac pacemaker: Secondary | ICD-10-CM | POA: Insufficient documentation

## 2022-01-22 LAB — CUP PACEART REMOTE DEVICE CHECK
Battery Impedance: 1086 Ohm
Battery Remaining Longevity: 55 mo
Battery Voltage: 2.78 V
Brady Statistic AP VP Percent: 30 %
Brady Statistic AP VS Percent: 0 %
Brady Statistic AS VP Percent: 69 %
Brady Statistic AS VS Percent: 0 %
Date Time Interrogation Session: 20231031104109
Implantable Lead Connection Status: 753985
Implantable Lead Connection Status: 753985
Implantable Lead Implant Date: 20151009
Implantable Lead Implant Date: 20151009
Implantable Lead Location: 753859
Implantable Lead Location: 753860
Implantable Lead Model: 5076
Implantable Lead Model: 5076
Implantable Pulse Generator Implant Date: 20151009
Lead Channel Impedance Value: 444 Ohm
Lead Channel Impedance Value: 596 Ohm
Lead Channel Pacing Threshold Amplitude: 0.375 V
Lead Channel Pacing Threshold Amplitude: 0.625 V
Lead Channel Pacing Threshold Pulse Width: 0.4 ms
Lead Channel Pacing Threshold Pulse Width: 0.4 ms
Lead Channel Setting Pacing Amplitude: 2 V
Lead Channel Setting Pacing Amplitude: 2.5 V
Lead Channel Setting Pacing Pulse Width: 0.4 ms
Lead Channel Setting Sensing Sensitivity: 2.8 mV
Zone Setting Status: 755011
Zone Setting Status: 755011

## 2022-01-23 ENCOUNTER — Ambulatory Visit: Payer: Medicare Other | Attending: Internal Medicine | Admitting: Internal Medicine

## 2022-01-23 ENCOUNTER — Encounter: Payer: Self-pay | Admitting: Internal Medicine

## 2022-01-23 VITALS — BP 138/62 | HR 84 | Ht 67.0 in | Wt 131.0 lb

## 2022-01-23 DIAGNOSIS — Z950 Presence of cardiac pacemaker: Secondary | ICD-10-CM

## 2022-01-23 DIAGNOSIS — I10 Essential (primary) hypertension: Secondary | ICD-10-CM | POA: Diagnosis not present

## 2022-01-23 DIAGNOSIS — I442 Atrioventricular block, complete: Secondary | ICD-10-CM

## 2022-01-23 NOTE — Patient Instructions (Signed)
Medication Instructions:  Your physician recommends that you continue on your current medications as directed. Please refer to the Current Medication list given to you today.  *If you need a refill on your cardiac medications before your next appointment, please call your pharmacy*   Lab Work: NONE   If you have labs (blood work) drawn today and your tests are completely normal, you will receive your results only by: MyChart Message (if you have MyChart) OR A paper copy in the mail If you have any lab test that is abnormal or we need to change your treatment, we will call you to review the results.   Testing/Procedures: NONE    Follow-Up: At [facility], you and your health needs are our priority.  As part of our continuing mission to provide you with exceptional heart care, we have created designated Provider Care Teams.  These Care Teams include your primary Cardiologist (physician) and Advanced Practice Providers (APPs -  Physician Assistants and Nurse Practitioners) who all work together to provide you with the care you need, when you need it.  We recommend signing up for the patient portal called "MyChart".  Sign up information is provided on this After Visit Summary.  MyChart is used to connect with patients for Virtual Visits (Telemedicine).  Patients are able to view lab/test results, encounter notes, upcoming appointments, etc.  Non-urgent messages can be sent to your provider as well.   To learn more about what you can do with MyChart, go to [email or web address].    Your next appointment:   1 year(s)  The format for your next appointment:   In Person  Provider:   Gregg Taylor, MD    Other Instructions Thank you for choosing [facility]!    Important Information About Sugar       

## 2022-01-23 NOTE — Progress Notes (Signed)
HPI  Mrs. Boorman returns today for ongoing management of her PPM. She is a very pleasant 88 yo woman with symptomatic bradycardia. She has had persistent symptoms and found on cardiac monitoring to have daytime periods of high grade AV block with 2:1 heart block in the setting of RBBB. She underwent insertion of a medtronic DDD PM several years ago. No syncope. She is improved.     Current Outpatient Medications  Medication Sig Dispense Refill   amLODipine (NORVASC) 5 MG tablet Take 1 tablet (5 mg total) by mouth daily. 90 tablet 3   Calcium Carbonate (CALCIUM 600 PO) Take 600 mg by mouth daily.      cholecalciferol (VITAMIN D) 400 UNITS TABS tablet Take 400 Units by mouth.     donepezil (ARICEPT) 5 MG tablet Take 5 mg by mouth daily.     fish oil-omega-3 fatty acids 1000 MG capsule Take 1 g by mouth daily.     memantine (NAMENDA) 5 MG tablet Take 5 mg by mouth 2 (two) times daily.     venlafaxine XR (EFFEXOR-XR) 37.5 MG 24 hr capsule Take 37.5 mg by mouth daily.     Coenzyme Q10 (CO Q 10) 100 MG CAPS Take 100 mg by mouth daily.     diphenhydrAMINE (BENADRYL) 25 MG tablet Take 25 mg by mouth at bedtime.     No current facility-administered medications for this visit.     Past Medical History:  Diagnosis Date   Complete heart block ([place])    a. s/p PPM 12/31/13:  Medtronic Adapta L (serial number [id number]) pacemaker   Essential hypertension    Goiter    Mixed hyperlipidemia    Spinal stenosis    Thyroid nodule     ROS:   All systems reviewed and negative except as noted in the HPI.   Past Surgical History:  Procedure Laterality Date   BIOPSY THYROID Left 11/2006   BREAST BIOPSY Right 1990's   "calcium deposit"   CATARACT EXTRACTION W/ INTRAOCULAR LENS  IMPLANT, BILATERAL Bilateral 2001   DILATION AND CURETTAGE OF UTERUS     S/P miscarriage   entropion  11/04/2017   right eye   ESOPHAGEAL DILATION N/A 03/08/2015   Procedure: ESOPHAGEAL DILATION;  Surgeon:  Rogene Houston, MD;  [facility];  Service: Endoscopy;  Laterality: N/A;   ESOPHAGOGASTRODUODENOSCOPY N/A 03/08/2015   Procedure: ESOPHAGOGASTRODUODENOSCOPY (EGD);  Surgeon: Rogene Houston, MD;  [facility];  Service: Endoscopy;  Laterality: N/A;  3:00   INSERT / REPLACE / REMOVE PACEMAKER  12/30/2013   KNEE ARTHROSCOPY Left 1986   PERMANENT PACEMAKER INSERTION N/A 12/30/2013   Procedure: PERMANENT PACEMAKER INSERTION;  Surgeon: Evans Lance, MD;  Location: [facility] CATH LAB;  Service: Cardiovascular;  Laterality: N/A;   SMALL BOWEL REPAIR N/A 11/05/2019   Procedure: SMALL BOWEL REPAIR;  Surgeon: Aviva Signs, MD;  Location: [facility] ORS;  Service: General;  Laterality: N/A;   TONSILLECTOMY  ~ 1943   TOTAL KNEE ARTHROPLASTY Left 09/2010     Family History  Problem Relation Age of Onset   Cancer Other    Stroke Other      Social History   Socioeconomic History   Marital status: Married    Spouse name: Not on file   Number of children: Not on file   Years of education: Not on file   Highest education level: Not on file  Occupational History   Not on file  Tobacco  Use   Smoking status: Never   Smokeless tobacco: Never  Vaping Use   Vaping Use: Never used  Substance and Sexual Activity   Alcohol use: No    Alcohol/week: 0.0 standard drinks of alcohol   Drug use: No   Sexual activity: Not on file  Other Topics Concern   Not on file  Social History Narrative   Not on file   Social Determinants of Health   Financial Resource Strain: Not on file  Food Insecurity: Not on file  Transportation Needs: Not on file  Physical Activity: Not on file  Stress: Not on file  Social Connections: Not on file  Intimate Partner Violence: Not on file     BP 138/62   Pulse 84   Ht '5\' 7"'$  (1.702 m)   Wt 131 lb (59.4 kg)   SpO2 97%   BMI 20.52 kg/m   Physical Exam:  Well appearing NAD HEENT: Unremarkable Neck:  No JVD, no thyromegally Lymphatics:  No  adenopathy Back:  No CVA tenderness Lungs:  Cleard with no wheezes HEART:  Regular rate rhythm, no murmurs, no rubs, no clicks Abd:  soft, positive bowel sounds, no organomegally, no rebound, no guarding Ext:  2 plus pulses, no edema, no cyanosis, no clubbing Skin:  No rashes no nodules Neuro:  CN II through XII intact, motor grossly intact  EKG - nsr with P synchronous ventricular pacing  DEVICE  Normal device function.  See PaceArt for details.   Assess/Plan: CHB - she is s/p PPM. She has an escape today. HTN - her bp is fairly well controlled. She will maintain a low sodium diet. PPM - her medtronic DDD PM is working normally. We will recheck in several months.  Carleene Overlie Josyah Achor,MD

## 2022-02-05 NOTE — Progress Notes (Signed)
Remote pacemaker transmission.   

## 2022-03-21 DIAGNOSIS — E559 Vitamin D deficiency, unspecified: Secondary | ICD-10-CM | POA: Diagnosis not present

## 2022-03-21 DIAGNOSIS — E782 Mixed hyperlipidemia: Secondary | ICD-10-CM | POA: Diagnosis not present

## 2022-03-21 DIAGNOSIS — I10 Essential (primary) hypertension: Secondary | ICD-10-CM | POA: Diagnosis not present

## 2022-03-21 DIAGNOSIS — E539 Vitamin B deficiency, unspecified: Secondary | ICD-10-CM | POA: Diagnosis not present

## 2022-03-28 DIAGNOSIS — Z950 Presence of cardiac pacemaker: Secondary | ICD-10-CM | POA: Diagnosis not present

## 2022-03-28 DIAGNOSIS — F0390 Unspecified dementia without behavioral disturbance: Secondary | ICD-10-CM | POA: Diagnosis not present

## 2022-03-28 DIAGNOSIS — R809 Proteinuria, unspecified: Secondary | ICD-10-CM | POA: Diagnosis not present

## 2022-03-28 DIAGNOSIS — I10 Essential (primary) hypertension: Secondary | ICD-10-CM | POA: Diagnosis not present

## 2022-03-28 DIAGNOSIS — E539 Vitamin B deficiency, unspecified: Secondary | ICD-10-CM | POA: Diagnosis not present

## 2022-03-28 DIAGNOSIS — Z79899 Other long term (current) drug therapy: Secondary | ICD-10-CM | POA: Diagnosis not present

## 2022-03-28 DIAGNOSIS — E782 Mixed hyperlipidemia: Secondary | ICD-10-CM | POA: Diagnosis not present

## 2022-03-28 DIAGNOSIS — M25561 Pain in right knee: Secondary | ICD-10-CM | POA: Diagnosis not present

## 2022-03-28 DIAGNOSIS — E559 Vitamin D deficiency, unspecified: Secondary | ICD-10-CM | POA: Diagnosis not present

## 2022-03-28 DIAGNOSIS — H9193 Unspecified hearing loss, bilateral: Secondary | ICD-10-CM | POA: Diagnosis not present

## 2022-03-28 DIAGNOSIS — F330 Major depressive disorder, recurrent, mild: Secondary | ICD-10-CM | POA: Diagnosis not present

## 2022-04-09 DIAGNOSIS — H838X3 Other specified diseases of inner ear, bilateral: Secondary | ICD-10-CM | POA: Diagnosis not present

## 2022-04-09 DIAGNOSIS — H903 Sensorineural hearing loss, bilateral: Secondary | ICD-10-CM | POA: Diagnosis not present

## 2022-04-22 ENCOUNTER — Ambulatory Visit: Payer: Medicare Other

## 2022-04-22 DIAGNOSIS — I442 Atrioventricular block, complete: Secondary | ICD-10-CM

## 2022-04-22 LAB — CUP PACEART REMOTE DEVICE CHECK
Battery Impedance: 1138 Ohm
Battery Remaining Longevity: 53 mo
Battery Voltage: 2.78 V
Brady Statistic AP VP Percent: 19 %
Brady Statistic AP VS Percent: 0 %
Brady Statistic AS VP Percent: 80 %
Brady Statistic AS VS Percent: 0 %
Date Time Interrogation Session: 20240130125508
Implantable Lead Connection Status: 753985
Implantable Lead Connection Status: 753985
Implantable Lead Implant Date: 20151009
Implantable Lead Implant Date: 20151009
Implantable Lead Location: 753859
Implantable Lead Location: 753860
Implantable Lead Model: 5076
Implantable Lead Model: 5076
Implantable Pulse Generator Implant Date: 20151009
Lead Channel Impedance Value: 438 Ohm
Lead Channel Impedance Value: 567 Ohm
Lead Channel Pacing Threshold Amplitude: 0.375 V
Lead Channel Pacing Threshold Amplitude: 0.5 V
Lead Channel Pacing Threshold Pulse Width: 0.4 ms
Lead Channel Pacing Threshold Pulse Width: 0.4 ms
Lead Channel Setting Pacing Amplitude: 2 V
Lead Channel Setting Pacing Amplitude: 2.5 V
Lead Channel Setting Pacing Pulse Width: 0.4 ms
Lead Channel Setting Sensing Sensitivity: 2.8 mV
Zone Setting Status: 755011
Zone Setting Status: 755011

## 2022-05-05 DIAGNOSIS — M1711 Unilateral primary osteoarthritis, right knee: Secondary | ICD-10-CM | POA: Diagnosis not present

## 2022-05-20 NOTE — Progress Notes (Signed)
Remote pacemaker transmission.   

## 2022-06-18 NOTE — Progress Notes (Unsigned)
Cardiology Office Note    Date:  06/19/2022  ID:  TAEJAH MARGRAVE, DOB 11/23/1925, MRN [id number] Cardiologist: Rozann Lesches, MD    History of Present Illness:    Laura Martin is a 89 y.o. female with past medical history of CHB (s/p Medtronic PPM placement in 2015), HTN, HLD and memory loss who presents to the office today for annual follow-up.   She was examined by myself in 04/2021 and was still active at baseline and enjoyed walking her dog. She denied any recent anginal symptoms was continued on her current cardiac medications including Amlodipine 5 mg daily and Fish Oil. She did have a follow-up visit with Dr. Lovena Le in 01/2022 and her device was working normally at that time. Remote transmission in 03/2022 showed normal device function and AF burden less than 0.1% and all less than 1 minute. No changes were made to her medical therapy.  In talking with the patient and her daughter today, she reports overall doing well from a cardiac perspective since her last office visit. She lives by herself but has a caregiver that helps throughout the week and her 3 children live close by as well. She denies any chest pain or dyspnea on exertion with routine activities. No specific orthopnea, PND or pitting edema.  Reports occasional, very brief palpitations which last for few seconds but no persistent symptoms and no issues over the past few months. Her biggest issue at this time is knee pain and she receives routine steroid injections with some improvement in symptoms.  Studies Reviewed:   EKG: EKG is not ordered today. EKG from 01/23/2022 is reviewed and shows V-paced rhythm, heart rate 83.  Echocardiogram: 10/2013 Study Conclusions   - Left ventricle: The cavity size was normal. Wall thickness was    increased in a pattern of mild LVH. Systolic function was normal.    The estimated ejection fraction was in the range of 60% to 65%.    Wall motion was normal; there were no regional wall  motion    abnormalities. Doppler parameters are consistent with abnormal    left ventricular relaxation (grade 1 diastolic dysfunction).    Doppler parameters are consistent with high ventricular filling    pressure.  - Mitral valve: Mildly thickened leaflets . There was trivial    regurgitation.  - Tricuspid valve: There was mild regurgitation.  - Pulmonic valve: There was mild regurgitation.  - Pulmonary arteries: PA peak pressure: 33 mm Hg (S).    Physical Exam:   VS:  BP (!) 142/60   Pulse 74   Ht 5\' 7"  (1.702 m)   Wt 128 lb 12.8 oz (58.4 kg)   SpO2 99%   BMI 20.17 kg/m    Wt Readings from Last 3 Encounters:  06/19/22 128 lb 12.8 oz (58.4 kg)  01/23/22 131 lb (59.4 kg)  05/01/21 134 lb 9.6 oz (61.1 kg)     GEN: Pleasant, elderly female appearing in no acute distress NECK: No JVD; No carotid bruits CARDIAC: RRR, no murmurs, rubs, gallops RESPIRATORY:  Clear to auscultation without rales, wheezing or rhonchi  ABDOMEN: Appears non-distended. No obvious abdominal masses. EXTREMITIES: No clubbing or cyanosis. No pitting edema.  Distal pedal pulses are 2+ bilaterally.   Assessment and Plan:   1. Complete Heart Block - She does have a Medtronic PPM in place which is followed by Dr. Lovena Le and most recent interrogation in 03/2022 showed normal device function and AF burden less than 0.1% with all  episodes less than 1 minute, therefore she was not started on anticoagulation. Continue with remote device checks. If AF burden increases, would need to consider the addition of anticoagulation.    2. HTN - Her BP is at 142/60 during today's visit and has been well-controlled when checked at home. Would not aggressively treat given her age. Continue current medical therapy with Amlodipine 5mg  daily.   3. HLD - FLP in 02/2022 showed total cholesterol 184, triglycerides 121, HDL 59 and LDL 104. She is no longer on statin therapy given her age and has preferred to take Fish  Oil.  Signed, Erma Heritage, PA-C

## 2022-06-19 ENCOUNTER — Ambulatory Visit: Payer: Medicare Other | Attending: Student | Admitting: Student

## 2022-06-19 ENCOUNTER — Encounter: Payer: Self-pay | Admitting: Student

## 2022-06-19 VITALS — BP 142/60 | HR 74 | Ht 67.0 in | Wt 128.8 lb

## 2022-06-19 DIAGNOSIS — I442 Atrioventricular block, complete: Secondary | ICD-10-CM | POA: Diagnosis not present

## 2022-06-19 DIAGNOSIS — E782 Mixed hyperlipidemia: Secondary | ICD-10-CM | POA: Insufficient documentation

## 2022-06-19 DIAGNOSIS — I10 Essential (primary) hypertension: Secondary | ICD-10-CM | POA: Insufficient documentation

## 2022-06-19 NOTE — Patient Instructions (Signed)
Medication Instructions:  Your physician recommends that you continue on your current medications as directed. Please refer to the Current Medication list given to you today.  *If you need a refill on your cardiac medications before your next appointment, please call your pharmacy*   Lab Work: NONE   If you have labs (blood work) drawn today and your tests are completely normal, you will receive your results only by: [place] (if you have MyChart) OR A paper copy in the mail If you have any lab test that is abnormal or we need to change your treatment, we will call you to review the results.   Testing/Procedures: NONE    Follow-Up: At [facility], you and your health needs are our priority.  As part of our continuing mission to provide you with exceptional heart care, we have created designated Provider Care Teams.  These Care Teams include your primary Cardiologist (physician) and Advanced Practice Providers (APPs -  Physician Assistants and Nurse Practitioners) who all work together to provide you with the care you need, when you need it.  We recommend signing up for the patient portal called "MyChart".  Sign up information is provided on this After Visit Summary.  MyChart is used to connect with patients for Virtual Visits (Telemedicine).  Patients are able to view lab/test results, encounter notes, upcoming appointments, etc.  Non-urgent messages can be sent to your provider as well.   To learn more about what you can do with MyChart, go to [email or web address].    Your next appointment:   1 year(s)  Provider:   Rozann Lesches, MD or Bernerd Pho, PA-C    Other Instructions Thank you for choosing [facility]!

## 2022-07-22 ENCOUNTER — Ambulatory Visit (INDEPENDENT_AMBULATORY_CARE_PROVIDER_SITE_OTHER): Payer: Medicare Other

## 2022-07-22 DIAGNOSIS — I442 Atrioventricular block, complete: Secondary | ICD-10-CM

## 2022-07-25 LAB — CUP PACEART REMOTE DEVICE CHECK
Battery Impedance: 1193 Ohm
Battery Remaining Longevity: 52 mo
Battery Voltage: 2.78 V
Brady Statistic AP VP Percent: 22 %
Brady Statistic AP VS Percent: 0 %
Brady Statistic AS VP Percent: 78 %
Brady Statistic AS VS Percent: 0 %
Date Time Interrogation Session: 20240502154048
Implantable Lead Connection Status: 753985
Implantable Lead Connection Status: 753985
Implantable Lead Implant Date: 20151009
Implantable Lead Implant Date: 20151009
Implantable Lead Location: 753859
Implantable Lead Location: 753860
Implantable Lead Model: 5076
Implantable Lead Model: 5076
Implantable Pulse Generator Implant Date: 20151009
Lead Channel Impedance Value: 432 Ohm
Lead Channel Impedance Value: 590 Ohm
Lead Channel Pacing Threshold Amplitude: 0.375 V
Lead Channel Pacing Threshold Amplitude: 0.875 V
Lead Channel Pacing Threshold Pulse Width: 0.4 ms
Lead Channel Pacing Threshold Pulse Width: 0.4 ms
Lead Channel Setting Pacing Amplitude: 2 V
Lead Channel Setting Pacing Amplitude: 2.5 V
Lead Channel Setting Pacing Pulse Width: 0.4 ms
Lead Channel Setting Sensing Sensitivity: 2.8 mV
Zone Setting Status: 755011
Zone Setting Status: 755011

## 2022-08-06 ENCOUNTER — Encounter (HOSPITAL_COMMUNITY): Payer: Self-pay | Admitting: Internal Medicine

## 2022-08-06 ENCOUNTER — Other Ambulatory Visit (HOSPITAL_COMMUNITY): Payer: Self-pay | Admitting: Internal Medicine

## 2022-08-06 ENCOUNTER — Ambulatory Visit (HOSPITAL_COMMUNITY)
Admission: RE | Admit: 2022-08-06 | Discharge: 2022-08-06 | Disposition: A | Discharge status: Home / Self Care | Payer: Medicare Other | Source: Ambulatory Visit | Attending: Internal Medicine | Admitting: Internal Medicine

## 2022-08-06 DIAGNOSIS — Z950 Presence of cardiac pacemaker: Secondary | ICD-10-CM | POA: Diagnosis not present

## 2022-08-06 DIAGNOSIS — I10 Essential (primary) hypertension: Secondary | ICD-10-CM | POA: Diagnosis not present

## 2022-08-06 DIAGNOSIS — R0781 Pleurodynia: Secondary | ICD-10-CM | POA: Diagnosis not present

## 2022-08-06 DIAGNOSIS — S8991XA Unspecified injury of right lower leg, initial encounter: Secondary | ICD-10-CM | POA: Diagnosis not present

## 2022-08-06 DIAGNOSIS — S2231XA Fracture of one rib, right side, initial encounter for closed fracture: Secondary | ICD-10-CM | POA: Diagnosis not present

## 2022-08-06 DIAGNOSIS — H9193 Unspecified hearing loss, bilateral: Secondary | ICD-10-CM | POA: Diagnosis not present

## 2022-08-13 NOTE — Progress Notes (Signed)
Remote pacemaker transmission.   

## 2022-09-22 DIAGNOSIS — E539 Vitamin B deficiency, unspecified: Secondary | ICD-10-CM | POA: Diagnosis not present

## 2022-09-22 DIAGNOSIS — I10 Essential (primary) hypertension: Secondary | ICD-10-CM | POA: Diagnosis not present

## 2022-09-22 DIAGNOSIS — E782 Mixed hyperlipidemia: Secondary | ICD-10-CM | POA: Diagnosis not present

## 2022-09-22 DIAGNOSIS — E559 Vitamin D deficiency, unspecified: Secondary | ICD-10-CM | POA: Diagnosis not present

## 2022-09-30 DIAGNOSIS — I10 Essential (primary) hypertension: Secondary | ICD-10-CM | POA: Diagnosis not present

## 2022-09-30 DIAGNOSIS — F0390 Unspecified dementia without behavioral disturbance: Secondary | ICD-10-CM | POA: Diagnosis not present

## 2022-09-30 DIAGNOSIS — F330 Major depressive disorder, recurrent, mild: Secondary | ICD-10-CM | POA: Diagnosis not present

## 2022-09-30 DIAGNOSIS — E782 Mixed hyperlipidemia: Secondary | ICD-10-CM | POA: Diagnosis not present

## 2022-09-30 DIAGNOSIS — R809 Proteinuria, unspecified: Secondary | ICD-10-CM | POA: Diagnosis not present

## 2022-09-30 DIAGNOSIS — M25561 Pain in right knee: Secondary | ICD-10-CM | POA: Diagnosis not present

## 2022-09-30 DIAGNOSIS — Z950 Presence of cardiac pacemaker: Secondary | ICD-10-CM | POA: Diagnosis not present

## 2022-09-30 DIAGNOSIS — Z0000 Encounter for general adult medical examination without abnormal findings: Secondary | ICD-10-CM | POA: Diagnosis not present

## 2022-09-30 DIAGNOSIS — E539 Vitamin B deficiency, unspecified: Secondary | ICD-10-CM | POA: Diagnosis not present

## 2022-09-30 DIAGNOSIS — E559 Vitamin D deficiency, unspecified: Secondary | ICD-10-CM | POA: Diagnosis not present

## 2022-09-30 DIAGNOSIS — Z87891 Personal history of nicotine dependence: Secondary | ICD-10-CM | POA: Diagnosis not present

## 2022-09-30 DIAGNOSIS — H9193 Unspecified hearing loss, bilateral: Secondary | ICD-10-CM | POA: Diagnosis not present

## 2022-10-21 ENCOUNTER — Ambulatory Visit: Payer: Medicare Other

## 2022-10-21 DIAGNOSIS — I442 Atrioventricular block, complete: Secondary | ICD-10-CM | POA: Diagnosis not present

## 2022-10-21 LAB — CUP PACEART REMOTE DEVICE CHECK
Battery Impedance: 1328 Ohm
Battery Remaining Longevity: 49 mo
Battery Voltage: 2.77 V
Brady Statistic AP VP Percent: 24 %
Brady Statistic AP VS Percent: 0 %
Brady Statistic AS VP Percent: 76 %
Brady Statistic AS VS Percent: 0 %
Date Time Interrogation Session: 20240730105648
Implantable Lead Connection Status: 753985
Implantable Lead Connection Status: 753985
Implantable Lead Implant Date: 20151009
Implantable Lead Implant Date: 20151009
Implantable Lead Location: 753859
Implantable Lead Location: 753860
Implantable Lead Model: 5076
Implantable Lead Model: 5076
Implantable Pulse Generator Implant Date: 20151009
Lead Channel Impedance Value: 450 Ohm
Lead Channel Impedance Value: 662 Ohm
Lead Channel Pacing Threshold Amplitude: 0.375 V
Lead Channel Pacing Threshold Amplitude: 0.875 V
Lead Channel Pacing Threshold Pulse Width: 0.4 ms
Lead Channel Pacing Threshold Pulse Width: 0.4 ms
Lead Channel Setting Pacing Amplitude: 2 V
Lead Channel Setting Pacing Amplitude: 2.5 V
Lead Channel Setting Pacing Pulse Width: 0.4 ms
Lead Channel Setting Sensing Sensitivity: 2.8 mV
Zone Setting Status: 755011
Zone Setting Status: 755011

## 2022-11-12 NOTE — Progress Notes (Signed)
Remote pacemaker transmission.   

## 2023-01-20 ENCOUNTER — Ambulatory Visit (INDEPENDENT_AMBULATORY_CARE_PROVIDER_SITE_OTHER): Payer: Medicare Other

## 2023-01-20 DIAGNOSIS — I442 Atrioventricular block, complete: Secondary | ICD-10-CM | POA: Diagnosis not present

## 2023-01-22 LAB — CUP PACEART REMOTE DEVICE CHECK
Battery Impedance: 1438 Ohm
Battery Remaining Longevity: 46 mo
Battery Voltage: 2.77 V
Brady Statistic AP VP Percent: 25 %
Brady Statistic AP VS Percent: 0 %
Brady Statistic AS VP Percent: 75 %
Brady Statistic AS VS Percent: 0 %
Date Time Interrogation Session: 20241029115211
Implantable Lead Connection Status: 753985
Implantable Lead Connection Status: 753985
Implantable Lead Implant Date: 20151009
Implantable Lead Implant Date: 20151009
Implantable Lead Location: 753859
Implantable Lead Location: 753860
Implantable Lead Model: 5076
Implantable Lead Model: 5076
Implantable Pulse Generator Implant Date: 20151009
Lead Channel Impedance Value: 444 Ohm
Lead Channel Impedance Value: 628 Ohm
Lead Channel Pacing Threshold Amplitude: 0.375 V
Lead Channel Pacing Threshold Amplitude: 0.75 V
Lead Channel Pacing Threshold Pulse Width: 0.4 ms
Lead Channel Pacing Threshold Pulse Width: 0.4 ms
Lead Channel Setting Pacing Amplitude: 2 V
Lead Channel Setting Pacing Amplitude: 2.5 V
Lead Channel Setting Pacing Pulse Width: 0.4 ms
Lead Channel Setting Sensing Sensitivity: 2.8 mV
Zone Setting Status: 755011
Zone Setting Status: 755011

## 2023-02-03 ENCOUNTER — Ambulatory Visit: Payer: Medicare Other | Attending: Internal Medicine | Admitting: Internal Medicine

## 2023-02-03 ENCOUNTER — Encounter: Payer: Self-pay | Admitting: Internal Medicine

## 2023-02-03 VITALS — BP 130/60 | HR 72 | Ht 67.0 in | Wt 130.2 lb

## 2023-02-03 DIAGNOSIS — I442 Atrioventricular block, complete: Secondary | ICD-10-CM | POA: Diagnosis not present

## 2023-02-03 LAB — CUP PACEART INCLINIC DEVICE CHECK
Date Time Interrogation Session: 20241112130356
Implantable Lead Connection Status: 753985
Implantable Lead Connection Status: 753985
Implantable Lead Implant Date: 20151009
Implantable Lead Implant Date: 20151009
Implantable Lead Location: 753859
Implantable Lead Location: 753860
Implantable Lead Model: 5076
Implantable Lead Model: 5076
Implantable Pulse Generator Implant Date: 20151009

## 2023-02-03 NOTE — Progress Notes (Signed)
HPI [name] returns today for ongoing management of her PPM. She is a very pleasant 89 yo woman with symptomatic bradycardia. She has had persistent symptoms and found on cardiac monitoring to have daytime periods of high grade AV block with 2:1 heart block in the setting of RBBB. She underwent insertion of a medtronic DDD PM several years ago. No syncope. She is improved. She has had some memory loss in the short term.  Allergies  Allergen Reactions   Codeine Nausea And Vomiting   Tramadol Nausea And Vomiting   Sulfa Antibiotics Rash     Current Outpatient Medications  Medication Sig Dispense Refill   amLODipine (NORVASC) 5 MG tablet Take 1 tablet (5 mg total) by mouth daily. 90 tablet 3   Calcium Carbonate (CALCIUM 600 PO) Take 600 mg by mouth daily.      cholecalciferol (VITAMIN D) 400 UNITS TABS tablet Take 400 Units by mouth.     donepezil (ARICEPT) 5 MG tablet Take 5 mg by mouth daily.     fish oil-omega-3 fatty acids 1000 MG capsule Take 1 g by mouth daily.     memantine (NAMENDA) 5 MG tablet Take 5 mg by mouth 2 (two) times daily.     venlafaxine XR (EFFEXOR-XR) 37.5 MG 24 hr capsule Take 37.5 mg by mouth daily.     No current facility-administered medications for this visit.     Past Medical History:  Diagnosis Date   Complete heart block (HCC)    a. s/p PPM 12/31/13:  Medtronic Adapta L (serial number [id number]) pacemaker   Essential hypertension    Goiter    Mixed hyperlipidemia    Spinal stenosis    Thyroid nodule     ROS:   All systems reviewed and negative except as noted in the HPI.   Past Surgical History:  Procedure Laterality Date   BIOPSY THYROID Left 11/2006   BREAST BIOPSY Right 1990's   "calcium deposit"   CATARACT EXTRACTION W/ INTRAOCULAR LENS  IMPLANT, BILATERAL Bilateral 2001   DILATION AND CURETTAGE OF UTERUS     S/P miscarriage   entropion  11/04/2017   right eye   ESOPHAGEAL DILATION N/A 03/08/2015   Procedure: ESOPHAGEAL  DILATION;  Surgeon: Malissa Hippo, MD;  Location: [facility];  Service: Endoscopy;  Laterality: N/A;   ESOPHAGOGASTRODUODENOSCOPY N/A 03/08/2015   Procedure: ESOPHAGOGASTRODUODENOSCOPY (EGD);  Surgeon: Malissa Hippo, MD;  Location: [facility];  Service: Endoscopy;  Laterality: N/A;  3:00   INSERT / REPLACE / REMOVE PACEMAKER  12/30/2013   KNEE ARTHROSCOPY Left 1986   PERMANENT PACEMAKER INSERTION N/A 12/30/2013   Procedure: PERMANENT PACEMAKER INSERTION;  Surgeon: Marinus Maw, MD;  Location: [facility] CATH [facility];  Service: Cardiovascular;  Laterality: N/A;   SMALL BOWEL REPAIR N/A 11/05/2019   Procedure: SMALL BOWEL REPAIR;  Surgeon: Franky Macho, MD;  Location: [facility];  Service: General;  Laterality: N/A;   TONSILLECTOMY  ~ 1943   TOTAL KNEE ARTHROPLASTY Left 09/2010     Family History  Problem Relation Age of Onset   Cancer Other    Stroke Other      Social History   Socioeconomic History   Marital status: Married    Spouse name: Not on file   Number of children: Not on file   Years of education: Not on file   Highest education level: Not on file  Occupational History   Not on file  Tobacco Use  Smoking status: Never   Smokeless tobacco: Never  Vaping Use   Vaping status: Never Used  Substance and Sexual Activity   Alcohol use: No    Alcohol/week: 0.0 standard drinks of alcohol   Drug use: No   Sexual activity: Not on file  Other Topics Concern   Not on file  Social History Narrative   Not on file   Social Determinants of Health   Financial Resource Strain: Not on file  Food Insecurity: Not on file  Transportation Needs: Not on file  Physical Activity: Not on file  Stress: Not on file  Social Connections: Not on file  Intimate Partner Violence: Not on file     BP (!) 144/70   Pulse 72   Ht 5\' 7"  (1.702 m)   Wt 130 lb 3.2 oz (59.1 kg)   SpO2 95%   BMI 20.39 kg/m   Physical Exam:  Well appearing NAD HEENT: Unremarkable Neck:  No JVD, no  thyromegally Lymphatics:  No adenopathy Back:  No CVA tenderness Lungs:  Clear with no wheezes HEART:  Regular rate rhythm, no murmurs, no rubs, no clicks Abd:  soft, positive bowel sounds, no organomegally, no rebound, no guarding Ext:  2 plus pulses, no edema, no cyanosis, no clubbing Skin:  No rashes no nodules Neuro:  CN II through XII intact, motor grossly intact  EKG - P synch ventricular pacing  DEVICE  Normal device function.  See PaceArt for details.   Assess/Plan:  CHB - she is s/p PPM. She has an escape today. HTN - her bp is fairly well controlled. She will maintain a low sodium diet. PPM - her medtronic DDD PM is working normally. We will recheck in several months.   Sharlot Gowda Frederick Klinger,MD

## 2023-02-03 NOTE — Patient Instructions (Signed)

## 2023-02-09 NOTE — Progress Notes (Signed)
Remote pacemaker transmission.   

## 2023-02-27 DIAGNOSIS — M1711 Unilateral primary osteoarthritis, right knee: Secondary | ICD-10-CM | POA: Diagnosis not present

## 2023-04-02 DIAGNOSIS — E539 Vitamin B deficiency, unspecified: Secondary | ICD-10-CM | POA: Diagnosis not present

## 2023-04-02 DIAGNOSIS — I10 Essential (primary) hypertension: Secondary | ICD-10-CM | POA: Diagnosis not present

## 2023-04-02 DIAGNOSIS — E559 Vitamin D deficiency, unspecified: Secondary | ICD-10-CM | POA: Diagnosis not present

## 2023-04-08 DIAGNOSIS — F0393 Unspecified dementia, unspecified severity, with mood disturbance: Secondary | ICD-10-CM | POA: Diagnosis not present

## 2023-04-08 DIAGNOSIS — F330 Major depressive disorder, recurrent, mild: Secondary | ICD-10-CM | POA: Diagnosis not present

## 2023-04-08 DIAGNOSIS — E559 Vitamin D deficiency, unspecified: Secondary | ICD-10-CM | POA: Diagnosis not present

## 2023-04-08 DIAGNOSIS — H9193 Unspecified hearing loss, bilateral: Secondary | ICD-10-CM | POA: Diagnosis not present

## 2023-04-08 DIAGNOSIS — I10 Essential (primary) hypertension: Secondary | ICD-10-CM | POA: Diagnosis not present

## 2023-04-08 DIAGNOSIS — Z79891 Long term (current) use of opiate analgesic: Secondary | ICD-10-CM | POA: Diagnosis not present

## 2023-04-08 DIAGNOSIS — R809 Proteinuria, unspecified: Secondary | ICD-10-CM | POA: Diagnosis not present

## 2023-04-08 DIAGNOSIS — M25561 Pain in right knee: Secondary | ICD-10-CM | POA: Diagnosis not present

## 2023-04-08 DIAGNOSIS — E782 Mixed hyperlipidemia: Secondary | ICD-10-CM | POA: Diagnosis not present

## 2023-04-08 DIAGNOSIS — Z950 Presence of cardiac pacemaker: Secondary | ICD-10-CM | POA: Diagnosis not present

## 2023-04-08 DIAGNOSIS — E539 Vitamin B deficiency, unspecified: Secondary | ICD-10-CM | POA: Diagnosis not present

## 2023-04-08 DIAGNOSIS — Z79899 Other long term (current) drug therapy: Secondary | ICD-10-CM | POA: Diagnosis not present

## 2023-04-15 ENCOUNTER — Telehealth (INDEPENDENT_AMBULATORY_CARE_PROVIDER_SITE_OTHER): Payer: Self-pay | Admitting: Otolaryngology

## 2023-04-15 NOTE — Telephone Encounter (Signed)
confirmed appt & location [id number] [facility]

## 2023-04-16 ENCOUNTER — Ambulatory Visit (INDEPENDENT_AMBULATORY_CARE_PROVIDER_SITE_OTHER): Payer: Medicare Other | Admitting: Otolaryngology

## 2023-04-16 VITALS — BP 158/78 | Ht 66.0 in | Wt 131.0 lb

## 2023-04-16 DIAGNOSIS — H6121 Impacted cerumen, right ear: Secondary | ICD-10-CM

## 2023-04-16 DIAGNOSIS — H903 Sensorineural hearing loss, bilateral: Secondary | ICD-10-CM

## 2023-04-18 DIAGNOSIS — H6121 Impacted cerumen, right ear: Secondary | ICD-10-CM | POA: Insufficient documentation

## 2023-04-18 DIAGNOSIS — H903 Sensorineural hearing loss, bilateral: Secondary | ICD-10-CM | POA: Insufficient documentation

## 2023-04-18 NOTE — Progress Notes (Signed)
Patient ID: Laura Martin, female   DOB: 1925/09/25, 89 y.o.   MRN: [id number]  Follow-up: Hearing loss  HPI: The patient is a 89 year old female who returns today for her follow-up evaluation.  The patient was previously seen for bilateral sensorineural hearing loss.  She was fitted with bilateral hearing aids.  The patient returns today reporting no change in her hearing.  The use of hearing aids have helped with her speech understanding.  She denies any otalgia, otorrhea, or vertigo.  Exam: General: Communicates without difficulty, well nourished, no acute distress. Head: Normocephalic, no evidence injury, no tenderness, facial buttresses intact without stepoff. Face/sinus: No tenderness to palpation and percussion. Facial movement is normal and symmetric. Eyes: PERRL, EOMI. No scleral icterus, conjunctivae clear. Neuro: CN II exam reveals vision grossly intact.  No nystagmus at any point of gaze. Ears: Auricles well formed without lesions.  Right ear cerumen impaction.  The left ear canal and tympanic membrane are normal.  Nose: External evaluation reveals normal support and skin without lesions.  Dorsum is intact.  Anterior rhinoscopy reveals congested mucosa over anterior aspect of inferior turbinates and intact septum.  No purulence noted. Oral:  Oral cavity and oropharynx are intact, symmetric, without erythema or edema.  Mucosa is moist without lesions. Neck: Full range of motion without pain.  There is no significant lymphadenopathy.  No masses palpable.  Thyroid bed within normal limits to palpation.  Parotid glands and submandibular glands equal bilaterally without mass.  Trachea is midline. Neuro:  CN 2-12 grossly intact.   Procedure: Right ear cerumen disimpaction Anesthesia: None Description: Under the operating microscope, the cerumen is carefully removed with a combination of cerumen currette, alligator forceps, and suction catheters.  After the cerumen is removed, the TMs are noted to  be normal.  No mass, erythema, or lesions. The patient tolerated the procedure well.    Assessment: 1.  Right ear cerumen impaction.  After the disimpaction procedure, both tympanic membranes and middle ear spaces are noted to be normal. 2.  Subjectively stable bilateral high-frequency sensorineural hearing loss.  Plan: 1.  Otomicroscopy with right ear cerumen disimpaction. 2.  The physical exam findings are reviewed with the patient. 3.  Continue the use of her hearing aids. 4.  The patient will return for reevaluation in 1 year.

## 2023-04-21 ENCOUNTER — Ambulatory Visit: Payer: Medicare Other

## 2023-04-21 DIAGNOSIS — I442 Atrioventricular block, complete: Secondary | ICD-10-CM | POA: Diagnosis not present

## 2023-04-22 ENCOUNTER — Encounter: Payer: Self-pay | Admitting: Internal Medicine

## 2023-04-22 LAB — CUP PACEART REMOTE DEVICE CHECK
Battery Impedance: 1548 Ohm
Battery Remaining Longevity: 43 mo
Battery Voltage: 2.77 V
Brady Statistic AP VP Percent: 33 %
Brady Statistic AP VS Percent: 0 %
Brady Statistic AS VP Percent: 67 %
Brady Statistic AS VS Percent: 0 %
Date Time Interrogation Session: 20250128123937
Implantable Lead Connection Status: 753985
Implantable Lead Connection Status: 753985
Implantable Lead Implant Date: 20151009
Implantable Lead Implant Date: 20151009
Implantable Lead Location: 753859
Implantable Lead Location: 753860
Implantable Lead Model: 5076
Implantable Lead Model: 5076
Implantable Pulse Generator Implant Date: 20151009
Lead Channel Impedance Value: 445 Ohm
Lead Channel Impedance Value: 622 Ohm
Lead Channel Pacing Threshold Amplitude: 0.375 V
Lead Channel Pacing Threshold Amplitude: 0.875 V
Lead Channel Pacing Threshold Pulse Width: 0.4 ms
Lead Channel Pacing Threshold Pulse Width: 0.4 ms
Lead Channel Setting Pacing Amplitude: 2 V
Lead Channel Setting Pacing Amplitude: 2.5 V
Lead Channel Setting Pacing Pulse Width: 0.4 ms
Lead Channel Setting Sensing Sensitivity: 2.8 mV
Zone Setting Status: 755011
Zone Setting Status: 755011

## 2023-04-30 ENCOUNTER — Telehealth: Payer: Medicare Other | Admitting: Physician Assistant

## 2023-04-30 DIAGNOSIS — B9689 Other specified bacterial agents as the cause of diseases classified elsewhere: Secondary | ICD-10-CM | POA: Diagnosis not present

## 2023-04-30 DIAGNOSIS — J069 Acute upper respiratory infection, unspecified: Secondary | ICD-10-CM

## 2023-04-30 MED ORDER — DOXYCYCLINE HYCLATE 100 MG PO TABS: 100.0000 mg | ORAL_TABLET | Freq: Two times a day (BID) | ORAL | 0 refills | Status: DC

## 2023-04-30 MED ORDER — BENZONATATE 100 MG PO CAPS: 100.0000 mg | ORAL_CAPSULE | Freq: Three times a day (TID) | ORAL | 0 refills | Status: DC | PRN

## 2023-04-30 NOTE — Progress Notes (Signed)

## 2023-04-30 NOTE — Progress Notes (Signed)
 I have spent 5 minutes in review of e-visit questionnaire, review and updating patient chart, medical decision making and response to patient.   Piedad Climes, PA-C

## 2023-05-13 ENCOUNTER — Encounter: Payer: Self-pay | Admitting: Nurse Practitioner

## 2023-05-13 ENCOUNTER — Ambulatory Visit: Payer: Medicare Other

## 2023-05-13 ENCOUNTER — Other Ambulatory Visit: Payer: Self-pay

## 2023-05-13 ENCOUNTER — Ambulatory Visit
Admission: EM | Admit: 2023-05-13 | Discharge: 2023-05-13 | Disposition: A | Discharge status: Home / Self Care | Payer: Medicare Other | Attending: Nurse Practitioner | Admitting: Nurse Practitioner

## 2023-05-13 DIAGNOSIS — N390 Urinary tract infection, site not specified: Secondary | ICD-10-CM | POA: Insufficient documentation

## 2023-05-13 LAB — POCT URINALYSIS DIP (MANUAL ENTRY)
Bilirubin, UA: NEGATIVE
Glucose, UA: NEGATIVE mg/dL
Nitrite, UA: POSITIVE — AB
Protein Ur, POC: >=300 mg/dL — AB
Spec Grav, UA: 1.02 (ref 1.010–1.025)
Urobilinogen, UA: 0.2 U/dL
pH, UA: >=9 — AB (ref 5.0–8.0)

## 2023-05-13 MED ORDER — CEPHALEXIN 500 MG PO CAPS: 500.0000 mg | ORAL_CAPSULE | Freq: Four times a day (QID) | ORAL | 0 refills | Status: DC

## 2023-05-13 NOTE — Discharge Instructions (Addendum)
-  A urine culture is pending. You will be contacted if the pending test result shows the medication prescribed today needs to be changed. -Take medications as prescribed. -Increase fluids.  Try to drink at least 5-7 eight ounce glasses of water daily. -Tylenol for pain, fever, or general discomfort. -Develop a toileting schedule that will allow you to urinate at least every 2 hours. -Avoid caffeine to include tea, soda, and coffee. -Follow-up in the emergency department if you develop fever, chills, worsening abdominal pain, or other concerns.  -If symptoms recur or become more frequent, please follow-up with her primary care physician for further evaluation. -Follow-up as needed.

## 2023-05-13 NOTE — ED Triage Notes (Signed)
 Pt family reports urinary frequency, dysuria for last several days. Also reports decreased appetite and generalized weakness as well. Denies any known fever.

## 2023-05-13 NOTE — ED Provider Notes (Signed)
 [facility]    CSN: [id number] Arrival date & time: 05/13/23  1337      History   Chief Complaint Chief Complaint  Patient presents with   Urinary Frequency    HPI DANNAE KATO is a 89 y.o. female.   The history is provided by the patient and a caregiver.   Patient brought in by family for complaints of urinary frequency and dysuria for the past several days.  Family also reports decreased appetite, low back pain, and generalized weakness.  Denies fever, chills, chest pain, abdominal pain, nausea, vomiting, diarrhea, or rash.  Daughter denies history of recurrent UTIs.  Denies history of kidney stones or pyelonephritis.  Past Medical History:  Diagnosis Date   Complete heart block (HCC)    a. s/p PPM 12/31/13:  Medtronic Adapta L (serial number [id number]) pacemaker   Essential hypertension    Goiter    Mixed hyperlipidemia    Spinal stenosis    Thyroid nodule     Patient Active Problem List   Diagnosis Date Noted   Sensorineural hearing loss, bilateral 04/18/2023   Impacted cerumen of right ear 04/18/2023   Small bowel obstruction (HCC) 11/05/2019   SBO (small bowel obstruction) (HCC) 11/05/2019   Lack of intravenous access    Intestinal adhesions with complete obstruction (HCC)    Elevated glucose 04/21/2019   Anemia 04/21/2019   Osteoporosis 04/21/2019   Pacemaker 05/12/2014   Complete heart block (HCC) 01/01/2014   Mobitz type 2 second degree AV block 12/30/2013   Heart murmur 11/11/2013   Essential hypertension, benign 05/27/2011   Mixed hyperlipidemia 05/27/2011    Past Surgical History:  Procedure Laterality Date   BIOPSY THYROID Left 11/2006   BREAST BIOPSY Right 1990's   "calcium deposit"   CATARACT EXTRACTION W/ INTRAOCULAR LENS  IMPLANT, BILATERAL Bilateral 2001   DILATION AND CURETTAGE OF UTERUS     S/P miscarriage   entropion  11/04/2017   right eye   ESOPHAGEAL DILATION N/A 03/08/2015   Procedure: ESOPHAGEAL DILATION;   Surgeon: Malissa Hippo, MD;  Location: [facility];  Service: Endoscopy;  Laterality: N/A;   ESOPHAGOGASTRODUODENOSCOPY N/A 03/08/2015   Procedure: ESOPHAGOGASTRODUODENOSCOPY (EGD);  Surgeon: Malissa Hippo, MD;  Location: [facility];  Service: Endoscopy;  Laterality: N/A;  3:00   INSERT / REPLACE / REMOVE PACEMAKER  12/30/2013   KNEE ARTHROSCOPY Left 1986   PERMANENT PACEMAKER INSERTION N/A 12/30/2013   Procedure: PERMANENT PACEMAKER INSERTION;  Surgeon: Marinus Maw, MD;  Location: [facility] CATH LAB;  Service: Cardiovascular;  Laterality: N/A;   SMALL BOWEL REPAIR N/A 11/05/2019   Procedure: SMALL BOWEL REPAIR;  Surgeon: Franky Macho, MD;  Location: [facility];  Service: General;  Laterality: N/A;   TONSILLECTOMY  ~ 1943   TOTAL KNEE ARTHROPLASTY Left 09/2010    OB History   No obstetric history on file.      Home Medications    Prior to Admission medications   Medication Sig Start Date End Date Taking? Authorizing Provider  cephALEXin (KEFLEX) 500 MG capsule Take 1 capsule (500 mg total) by mouth 4 (four) times daily. 05/13/23  Yes Leath-Warren, Sadie Haber, NP  amLODipine (NORVASC) 5 MG tablet Take 1 tablet (5 mg total) by mouth daily. 05/01/21   Strader, Lennart Pall, PA-C  benzonatate (TESSALON) 100 MG capsule Take 1 capsule (100 mg total) by mouth 3 (three) times daily as needed for cough. 04/30/23   Waldon Merl, PA-C  Calcium Carbonate (CALCIUM  600 PO) Take 600 mg by mouth daily.     [provider]  cholecalciferol (VITAMIN D) 400 UNITS TABS tablet Take 400 Units by mouth.    [provider]  donepezil (ARICEPT) 5 MG tablet Take 5 mg by mouth daily. 12/13/20   [provider]  doxycycline (VIBRA-TABS) 100 MG tablet Take 1 tablet (100 mg total) by mouth 2 (two) times daily. 04/30/23   Waldon Merl, PA-C  fish oil-omega-3 fatty acids 1000 MG capsule Take 1 g by mouth daily.    [provider]  memantine (NAMENDA) 5 MG tablet Take 5 mg by mouth 2  (two) times daily. 01/02/22   [provider]  venlafaxine XR (EFFEXOR-XR) 37.5 MG 24 hr capsule Take 37.5 mg by mouth daily. 01/01/22   [provider]    Family History Family History  Problem Relation Age of Onset   Cancer Other    Stroke Other     Social History Social History   Tobacco Use   Smoking status: Never   Smokeless tobacco: Never  Vaping Use   Vaping status: Never Used  Substance Use Topics   Alcohol use: No    Alcohol/week: 0.0 standard drinks of alcohol   Drug use: No     Allergies   Codeine, Tramadol, and Sulfa antibiotics   Review of Systems Review of Systems Per HPI  Physical Exam Triage Vital Signs ED Triage Vitals  Encounter Vitals Group     BP 05/13/23 1448 (!) 163/95     Systolic BP Percentile --      Diastolic BP Percentile --      Pulse Rate 05/13/23 1448 82     Resp 05/13/23 1448 20     Temp 05/13/23 1448 97.8 F (36.6 C)     Temp Source 05/13/23 1448 Oral     SpO2 05/13/23 1448 94 %     Weight --      Height --      Head Circumference --      Peak Flow --      Pain Score 05/13/23 1447 2     Pain Loc --      Pain Education --      Exclude from Growth Chart --    No data found.  Updated Vital Signs BP (!) 163/95 (BP Location: Right Arm)   Pulse 82   Temp 97.8 F (36.6 C) (Oral)   Resp 20   SpO2 94%   Visual Acuity Right Eye Distance:   Left Eye Distance:   Bilateral Distance:    Right Eye Near:   Left Eye Near:    Bilateral Near:     Physical Exam Vitals and nursing note reviewed.  Constitutional:      General: She is not in acute distress.    Appearance: Normal appearance.  Eyes:     Extraocular Movements: Extraocular movements intact.     Conjunctiva/sclera: Conjunctivae normal.     Pupils: Pupils are equal, round, and reactive to light.  Cardiovascular:     Rate and Rhythm: Normal rate and regular rhythm.     Pulses: Normal pulses.     Heart sounds: Normal heart sounds.  Pulmonary:      Effort: Pulmonary effort is normal. No respiratory distress.     Breath sounds: Normal breath sounds. No stridor. No wheezing, rhonchi or rales.  Chest:     Chest wall: No tenderness.  Abdominal:     General: Bowel sounds are  normal.     Palpations: Abdomen is soft.     Tenderness: There is no abdominal tenderness. There is no right CVA tenderness or left CVA tenderness.  Musculoskeletal:     Cervical back: Normal range of motion.  Skin:    General: Skin is warm and dry.  Neurological:     General: No focal deficit present.     Mental Status: She is alert and oriented to person, place, and time.  Psychiatric:        Mood and Affect: Mood normal.        Behavior: Behavior normal.      UC Treatments / Results  Labs (all labs ordered are listed, but only abnormal results are displayed) Labs Reviewed  POCT URINALYSIS DIP (MANUAL ENTRY) - Abnormal; Notable for the following components:      Result Value   Clarity, UA cloudy (*)    Ketones, POC UA trace (5) (*)    Blood, UA small (*)    pH, UA >=9.0 (*)    Protein Ur, POC >=300 (*)    Nitrite, UA Positive (*)    Leukocytes, UA Large (3+) (*)    All other components within normal limits  URINE CULTURE    EKG   Radiology No results found.  Procedures Procedures (including critical care time)  Medications Ordered in UC Medications - No data to display  Initial Impression / Assessment and Plan / UC Course  I have reviewed the triage vital signs and the nursing notes.  Pertinent labs & imaging results that were available during my care of the patient were reviewed by me and considered in my medical decision making (see chart for details).  Urinalysis positive for leukocytes, nitrites, blood, and protein, consistent with cystitis.  Urine culture is pending.  Will treat with Keflex 500 mg 4 times daily for the next 7 days.  Supportive care recommendations were provided and discussed with patient's family to include  over-the-counter analgesics, fluids, developing a toileting schedule, and avoiding caffeine.  Discussed indications regarding ER follow-up.  Daughter was also advised to follow-up with patient's PCP if patient remains weak or has decreased appetite after completing the antibiotics.  Daughter was in agreement with this plan of care and verbalized understanding.  All questions were answered.  Patient stable for discharge.  Final Clinical Impressions(s) / UC Diagnoses   Final diagnoses:  Complicated UTI (urinary tract infection)     Discharge Instructions      -A urine culture is pending. You will be contacted if the pending test result shows the medication prescribed today needs to be changed. -Take medications as prescribed. -Increase fluids.  Try to drink at least 5-7 eight ounce glasses of water daily. -Tylenol for pain, fever, or general discomfort. -Develop a toileting schedule that will allow you to urinate at least every 2 hours. -Avoid caffeine to include tea, soda, and coffee. -Follow-up in the emergency department if you develop fever, chills, worsening abdominal pain, or other concerns.  -If symptoms recur or become more frequent, please follow-up with her primary care physician for further evaluation. -Follow-up as needed.    ED Prescriptions     Medication Sig Dispense Auth. Provider   cephALEXin (KEFLEX) 500 MG capsule Take 1 capsule (500 mg total) by mouth 4 (four) times daily. 28 capsule Leath-Warren, Sadie Haber, NP      PDMP not reviewed this encounter.   Abran Cantor, NP 05/13/23 1520

## 2023-05-15 LAB — URINE CULTURE: Culture: >=100000 — AB

## 2023-05-28 DIAGNOSIS — Z79899 Other long term (current) drug therapy: Secondary | ICD-10-CM | POA: Diagnosis not present

## 2023-05-28 DIAGNOSIS — R300 Dysuria: Secondary | ICD-10-CM | POA: Diagnosis not present

## 2023-05-28 DIAGNOSIS — F0390 Unspecified dementia without behavioral disturbance: Secondary | ICD-10-CM | POA: Diagnosis not present

## 2023-06-01 NOTE — Addendum Note (Signed)
 Addended by: Geralyn Flash D on: 06/01/2023 02:38 PM   Modules accepted: Orders

## 2023-06-01 NOTE — Progress Notes (Signed)
 Remote pacemaker transmission.

## 2023-06-15 ENCOUNTER — Ambulatory Visit: Payer: Medicare Other | Admitting: Cardiology

## 2023-07-21 ENCOUNTER — Ambulatory Visit (INDEPENDENT_AMBULATORY_CARE_PROVIDER_SITE_OTHER): Payer: Medicare Other

## 2023-07-21 DIAGNOSIS — I442 Atrioventricular block, complete: Secondary | ICD-10-CM | POA: Diagnosis not present

## 2023-07-23 LAB — CUP PACEART REMOTE DEVICE CHECK
Battery Impedance: 1606 Ohm
Battery Remaining Longevity: 42 mo
Battery Voltage: 2.77 V
Brady Statistic AP VP Percent: 29 %
Brady Statistic AP VS Percent: 0 %
Brady Statistic AS VP Percent: 71 %
Brady Statistic AS VS Percent: 0 %
Date Time Interrogation Session: 20250430160149
Implantable Lead Connection Status: 753985
Implantable Lead Connection Status: 753985
Implantable Lead Implant Date: 20151009
Implantable Lead Implant Date: 20151009
Implantable Lead Location: 753859
Implantable Lead Location: 753860
Implantable Lead Model: 5076
Implantable Lead Model: 5076
Implantable Pulse Generator Implant Date: 20151009
Lead Channel Impedance Value: 405 Ohm
Lead Channel Impedance Value: 640 Ohm
Lead Channel Pacing Threshold Amplitude: 0.375 V
Lead Channel Pacing Threshold Amplitude: 0.5 V
Lead Channel Pacing Threshold Pulse Width: 0.4 ms
Lead Channel Pacing Threshold Pulse Width: 0.4 ms
Lead Channel Setting Pacing Amplitude: 2 V
Lead Channel Setting Pacing Amplitude: 2.5 V
Lead Channel Setting Pacing Pulse Width: 0.4 ms
Lead Channel Setting Sensing Sensitivity: 2 mV
Zone Setting Status: 755011
Zone Setting Status: 755011

## 2023-07-24 DIAGNOSIS — W2209XA Striking against other stationary object, initial encounter: Secondary | ICD-10-CM | POA: Diagnosis not present

## 2023-07-24 DIAGNOSIS — S81802A Unspecified open wound, left lower leg, initial encounter: Secondary | ICD-10-CM | POA: Diagnosis not present

## 2023-07-24 DIAGNOSIS — S81801A Unspecified open wound, right lower leg, initial encounter: Secondary | ICD-10-CM | POA: Diagnosis not present

## 2023-07-24 DIAGNOSIS — W228XXA Striking against or struck by other objects, initial encounter: Secondary | ICD-10-CM | POA: Diagnosis not present

## 2023-07-26 ENCOUNTER — Encounter: Payer: Self-pay | Admitting: Internal Medicine

## 2023-08-07 ENCOUNTER — Encounter: Payer: Self-pay | Admitting: Nurse Practitioner

## 2023-08-07 ENCOUNTER — Ambulatory Visit: Attending: Nurse Practitioner | Admitting: Nurse Practitioner

## 2023-08-07 VITALS — BP 130/80 | HR 74 | Ht 67.0 in | Wt 134.0 lb

## 2023-08-07 DIAGNOSIS — R002 Palpitations: Secondary | ICD-10-CM | POA: Diagnosis not present

## 2023-08-07 DIAGNOSIS — Z950 Presence of cardiac pacemaker: Secondary | ICD-10-CM

## 2023-08-07 DIAGNOSIS — E785 Hyperlipidemia, unspecified: Secondary | ICD-10-CM | POA: Diagnosis not present

## 2023-08-07 DIAGNOSIS — I10 Essential (primary) hypertension: Secondary | ICD-10-CM | POA: Insufficient documentation

## 2023-08-07 NOTE — Progress Notes (Addendum)
  Cardiology Office Note:  .   Date:  08/07/2023 ID:  Laura Martin, DOB 08-Sep-1925, MRN [id number] PCP: Omie Bickers, MD  [facility] Providers Cardiologist:  Teddie Favre, MD    History of Present Illness: Laura Martin is a 89 y.o. female with a PMH of complete heart block, s/p PPM in 2015, hyperlipidemia, hypertension, memory loss, who presents today for 1 year follow-up.  She is being closely followed by EP.  Her electrophysiologist is Dr. Carolynne Citron.  Last seen by Woodfin Hays, PA-C on June 19, 2022. She was doing well overall at the time.   Today she presents for 1 year follow-up.  She states she is doing well. Does have some skin tears along both legs, says she has fragile skin, otherwise doing well. Does admit to occasional brief flutters, not bothersome per her report.  She does exhibit some episodes of memory loss during interview.  History also confirmed with her family member.  Denies any chest pain, shortness of breath, palpitations, syncope, presyncope, dizziness, orthopnea, PND, swelling or significant weight changes, acute bleeding, or claudication.   ROS: Negative. See HPI.  Studies Reviewed: Aaron Aas    EKG:  EKG Interpretation Date/Time:  Friday Aug 07 2023 15:30:27 EDT Ventricular Rate:  79 PR Interval:  164 QRS Duration:  176 QT Interval:  456 QTC Calculation: 522 R Axis:   -78  Text Interpretation: Atrial-sensed ventricular-paced rhythm When compared with ECG of 03-Feb-2023 10:57, Vent. rate has increased BY   7 BPM Confirmed by Lasalle Pointer [phone]) on 08/07/2023 3:32:24 PM   Physical Exam:   VS:  BP 130/80   Pulse 74   Ht 5\' 7"  (1.702 m)   Wt 134 lb (60.8 kg)   SpO2 98%   BMI 20.99 kg/m    Wt Readings from Last 3 Encounters:  08/07/23 134 lb (60.8 kg)  04/16/23 131 lb (59.4 kg)  02/03/23 130 lb 3.2 oz (59.1 kg)    GEN: Thin, frail 89 year old female in no acute distress NECK: No JVD; No carotid bruits CARDIAC: S1/S2, RRR, no murmurs,  rubs, gallops RESPIRATORY:  Clear to auscultation without rales, wheezing or rhonchi  ABDOMEN: Soft, non-tender, non-distended EXTREMITIES:  No edema; No deformity   ASSESSMENT AND PLAN: .    CHB, s/p PPM, palpitations Admits to brief "flutters", not bothersome per report.  Patient does exhibit some memory loss at times during interview.  Most recent remote device check revealed normal device function.  No medication changes at this time.  Continue follow-up with EP.  Care and ED precautions discussed.  HLD Most recent LDL 126 in January 2025.  Did discuss treatment options including medication therapy.  Patient declines.  Has been noted she is no longer on statin therapy given her age and has preferred to take fish oil.  No medication changes at this time.  Continue follow-up with PCP. Heart healthy diet and regular cardiovascular exercise encouraged.   HTN Blood pressure is stable. Discussed to monitor BP at home at least 2 hours after medications and sitting for 5-10 minutes.  No medication changes at this time. Heart healthy diet and regular cardiovascular exercise encouraged.     Dispo: Follow-up with MD/APP in 1 year or sooner if any changes.  Signed, Lasalle Pointer, NP

## 2023-08-07 NOTE — Patient Instructions (Addendum)

## 2023-08-14 NOTE — Addendum Note (Signed)
 Addended by: Lasalle Pointer on: 08/14/2023 10:20 AM   Modules accepted: Level of Service

## 2023-09-04 NOTE — Progress Notes (Signed)
 Remote pacemaker transmission.

## 2023-09-04 NOTE — Addendum Note (Signed)
 Addended by: Lott Rouleau A on: 09/04/2023 10:09 AM   Modules accepted: Orders

## 2023-09-30 DIAGNOSIS — I10 Essential (primary) hypertension: Secondary | ICD-10-CM | POA: Diagnosis not present

## 2023-10-06 DIAGNOSIS — H9193 Unspecified hearing loss, bilateral: Secondary | ICD-10-CM | POA: Diagnosis not present

## 2023-10-06 DIAGNOSIS — M25561 Pain in right knee: Secondary | ICD-10-CM | POA: Diagnosis not present

## 2023-10-06 DIAGNOSIS — E785 Hyperlipidemia, unspecified: Secondary | ICD-10-CM | POA: Diagnosis not present

## 2023-10-06 DIAGNOSIS — Z0001 Encounter for general adult medical examination with abnormal findings: Secondary | ICD-10-CM | POA: Diagnosis not present

## 2023-10-06 DIAGNOSIS — R809 Proteinuria, unspecified: Secondary | ICD-10-CM | POA: Diagnosis not present

## 2023-10-06 DIAGNOSIS — E539 Vitamin B deficiency, unspecified: Secondary | ICD-10-CM | POA: Diagnosis not present

## 2023-10-06 DIAGNOSIS — F0390 Unspecified dementia without behavioral disturbance: Secondary | ICD-10-CM | POA: Diagnosis not present

## 2023-10-06 DIAGNOSIS — E559 Vitamin D deficiency, unspecified: Secondary | ICD-10-CM | POA: Diagnosis not present

## 2023-10-06 DIAGNOSIS — I10 Essential (primary) hypertension: Secondary | ICD-10-CM | POA: Diagnosis not present

## 2023-10-06 DIAGNOSIS — E782 Mixed hyperlipidemia: Secondary | ICD-10-CM | POA: Diagnosis not present

## 2023-10-06 DIAGNOSIS — F330 Major depressive disorder, recurrent, mild: Secondary | ICD-10-CM | POA: Diagnosis not present

## 2023-10-07 DIAGNOSIS — M1711 Unilateral primary osteoarthritis, right knee: Secondary | ICD-10-CM | POA: Diagnosis not present

## 2023-10-20 ENCOUNTER — Ambulatory Visit: Payer: Medicare Other

## 2023-10-20 DIAGNOSIS — I442 Atrioventricular block, complete: Secondary | ICD-10-CM | POA: Diagnosis not present

## 2023-10-21 LAB — CUP PACEART REMOTE DEVICE CHECK
Battery Impedance: 1748 Ohm
Battery Remaining Longevity: 39 mo
Battery Voltage: 2.76 V
Brady Statistic AP VP Percent: 30 %
Brady Statistic AP VS Percent: 0 %
Brady Statistic AS VP Percent: 70 %
Brady Statistic AS VS Percent: 0 %
Date Time Interrogation Session: 20250729193806
Implantable Lead Connection Status: 753985
Implantable Lead Connection Status: 753985
Implantable Lead Implant Date: 20151009
Implantable Lead Implant Date: 20151009
Implantable Lead Location: 753859
Implantable Lead Location: 753860
Implantable Lead Model: 5076
Implantable Lead Model: 5076
Implantable Pulse Generator Implant Date: 20151009
Lead Channel Impedance Value: 401 Ohm
Lead Channel Impedance Value: 669 Ohm
Lead Channel Pacing Threshold Amplitude: 0.375 V
Lead Channel Pacing Threshold Amplitude: 0.75 V
Lead Channel Pacing Threshold Pulse Width: 0.4 ms
Lead Channel Pacing Threshold Pulse Width: 0.4 ms
Lead Channel Setting Pacing Amplitude: 2 V
Lead Channel Setting Pacing Amplitude: 2.5 V
Lead Channel Setting Pacing Pulse Width: 0.4 ms
Lead Channel Setting Sensing Sensitivity: 2 mV
Zone Setting Status: 755011
Zone Setting Status: 755011

## 2023-10-23 ENCOUNTER — Ambulatory Visit: Payer: Self-pay | Admitting: Internal Medicine

## 2023-12-04 DIAGNOSIS — R296 Repeated falls: Secondary | ICD-10-CM | POA: Diagnosis not present

## 2023-12-04 DIAGNOSIS — R531 Weakness: Secondary | ICD-10-CM | POA: Diagnosis not present

## 2023-12-04 DIAGNOSIS — R32 Unspecified urinary incontinence: Secondary | ICD-10-CM | POA: Diagnosis not present

## 2023-12-18 DIAGNOSIS — R531 Weakness: Secondary | ICD-10-CM | POA: Diagnosis not present

## 2023-12-18 DIAGNOSIS — R296 Repeated falls: Secondary | ICD-10-CM | POA: Diagnosis not present

## 2023-12-18 DIAGNOSIS — R32 Unspecified urinary incontinence: Secondary | ICD-10-CM | POA: Diagnosis not present

## 2023-12-23 NOTE — Progress Notes (Signed)
 Remote PPM Transmission

## 2024-01-19 ENCOUNTER — Ambulatory Visit

## 2024-01-19 DIAGNOSIS — I442 Atrioventricular block, complete: Secondary | ICD-10-CM | POA: Diagnosis not present

## 2024-01-20 ENCOUNTER — Telehealth: Payer: Self-pay

## 2024-01-20 LAB — CUP PACEART REMOTE DEVICE CHECK
Battery Impedance: 1866 Ohm
Battery Remaining Longevity: 37 mo
Battery Voltage: 2.76 V
Brady Statistic AP VP Percent: 31 %
Brady Statistic AP VS Percent: 0 %
Brady Statistic AS VP Percent: 69 %
Brady Statistic AS VS Percent: 0 %
Date Time Interrogation Session: 20251028121905
Implantable Lead Connection Status: 753985
Implantable Lead Connection Status: 753985
Implantable Lead Implant Date: 20151009
Implantable Lead Implant Date: 20151009
Implantable Lead Location: 753859
Implantable Lead Location: 753860
Implantable Lead Model: 5076
Implantable Lead Model: 5076
Implantable Pulse Generator Implant Date: 20151009
Lead Channel Impedance Value: 427 Ohm
Lead Channel Impedance Value: 686 Ohm
Lead Channel Pacing Threshold Amplitude: 0.375 V
Lead Channel Pacing Threshold Amplitude: 0.875 V
Lead Channel Pacing Threshold Pulse Width: 0.4 ms
Lead Channel Pacing Threshold Pulse Width: 0.4 ms
Lead Channel Setting Pacing Amplitude: 2 V
Lead Channel Setting Pacing Amplitude: 2.5 V
Lead Channel Setting Pacing Pulse Width: 0.4 ms
Lead Channel Setting Sensing Sensitivity: 2.8 mV
Zone Setting Status: 755011
Zone Setting Status: 755011

## 2024-01-20 NOTE — Telephone Encounter (Signed)
 Scheduled transmission received.  Noted NSVT episode from 01/19/2024 lasting 8 seconds.  Outreach made to Pt's daughter.  She states Pt's caregiver did not report any issues, but she will call and confirm.  Await call back.

## 2024-01-22 NOTE — Telephone Encounter (Signed)
 Outreach made to Pt's daughter.  Per daughter Pt has felt fine.  Caregiver does not report any issues.    Will continue to monitor.

## 2024-01-26 NOTE — Progress Notes (Signed)
 Remote PPM Transmission

## 2024-01-28 ENCOUNTER — Ambulatory Visit: Payer: Self-pay | Admitting: Internal Medicine

## 2024-02-12 DIAGNOSIS — T148XXA Other injury of unspecified body region, initial encounter: Secondary | ICD-10-CM | POA: Diagnosis not present

## 2024-02-12 DIAGNOSIS — L0390 Cellulitis, unspecified: Secondary | ICD-10-CM | POA: Diagnosis not present

## 2024-02-12 DIAGNOSIS — S81812D Laceration without foreign body, left lower leg, subsequent encounter: Secondary | ICD-10-CM | POA: Diagnosis not present

## 2024-02-12 DIAGNOSIS — S81812A Laceration without foreign body, left lower leg, initial encounter: Secondary | ICD-10-CM | POA: Diagnosis not present

## 2024-02-15 DIAGNOSIS — T148XXA Other injury of unspecified body region, initial encounter: Secondary | ICD-10-CM | POA: Diagnosis not present

## 2024-02-15 DIAGNOSIS — L0390 Cellulitis, unspecified: Secondary | ICD-10-CM | POA: Diagnosis not present

## 2024-02-15 DIAGNOSIS — S81811D Laceration without foreign body, right lower leg, subsequent encounter: Secondary | ICD-10-CM | POA: Diagnosis not present

## 2024-02-22 DIAGNOSIS — L089 Local infection of the skin and subcutaneous tissue, unspecified: Secondary | ICD-10-CM | POA: Diagnosis not present

## 2024-02-24 ENCOUNTER — Ambulatory Visit (HOSPITAL_COMMUNITY): Admitting: Physical Therapy

## 2024-02-24 ENCOUNTER — Other Ambulatory Visit: Payer: Self-pay

## 2024-02-24 DIAGNOSIS — M79661 Pain in right lower leg: Secondary | ICD-10-CM | POA: Insufficient documentation

## 2024-02-24 DIAGNOSIS — S81801S Unspecified open wound, right lower leg, sequela: Secondary | ICD-10-CM | POA: Insufficient documentation

## 2024-02-24 NOTE — Therapy (Signed)
 OUTPATIENT PHYSICAL THERAPY Wound EVALUATION   Patient Name: Laura Martin MRN: [id number] DOB:Jan 20, 1926, 89 y.o., female Today's Date: 02/24/2024   PCP: Laura Martin REFERRING PROVIDER: Joan Laneta HERO, FNP  END OF SESSION:  PT End of Session - 02/24/24 1535     Visit Number 1    Number of Visits 4    Date for Recertification  03/23/24    Authorization Type medicare    PT Start Time 1503    PT Stop Time 1523    PT Time Calculation (min) 20 min          Past Medical History:  Diagnosis Date   Complete heart block (HCC)    a. s/p PPM 12/31/13:  Medtronic Adapta L (serial number [id number]) pacemaker   Essential hypertension    Goiter    Mixed hyperlipidemia    Spinal stenosis    Thyroid  nodule    Past Surgical History:  Procedure Laterality Date   BIOPSY THYROID  Left 11/2006   BREAST BIOPSY Right 1990's   calcium deposit   CATARACT EXTRACTION W/ INTRAOCULAR LENS  IMPLANT, BILATERAL Bilateral 2001   DILATION AND CURETTAGE OF UTERUS     S/P miscarriage   entropion  11/04/2017   right eye   ESOPHAGEAL DILATION N/A 03/08/2015   Procedure: ESOPHAGEAL DILATION;  Surgeon: Laura RAYMOND Rivet, MD;  Location: [facility];  Service: Endoscopy;  Laterality: N/A;   ESOPHAGOGASTRODUODENOSCOPY N/A 03/08/2015   Procedure: ESOPHAGOGASTRODUODENOSCOPY (EGD);  Surgeon: Laura RAYMOND Rivet, MD;  Location: [facility];  Service: Endoscopy;  Laterality: N/A;  3:00   INSERT / REPLACE / REMOVE PACEMAKER  12/30/2013   KNEE ARTHROSCOPY Left 1986   PERMANENT PACEMAKER INSERTION N/A 12/30/2013   Procedure: PERMANENT PACEMAKER INSERTION;  Surgeon: Laura LELON Birmingham, MD;  Location: [facility] CATH LAB;  Service: Cardiovascular;  Laterality: N/A;   SMALL BOWEL REPAIR N/A 11/05/2019   Procedure: SMALL BOWEL REPAIR;  Surgeon: Laura Anes, MD;  Location: [facility];  Service: General;  Laterality: N/A;   TONSILLECTOMY  ~ 1943   TOTAL KNEE ARTHROPLASTY Left 09/2010   Patient Active Problem List   Diagnosis  Date Noted   Sensorineural hearing loss, bilateral 04/18/2023   Impacted cerumen of right ear 04/18/2023   Small bowel obstruction (HCC) 11/05/2019   SBO (small bowel obstruction) (HCC) 11/05/2019   Lack of intravenous access    Intestinal adhesions with complete obstruction (HCC)    Elevated glucose 04/21/2019   Anemia 04/21/2019   Osteoporosis 04/21/2019   Pacemaker 05/12/2014   Complete heart block (HCC) 01/01/2014   Mobitz type 2 second degree AV block 12/30/2013   Heart murmur 11/11/2013   Essential hypertension, benign 05/27/2011   Mixed hyperlipidemia 05/27/2011    ONSET DATE: 02/03/24 approximate  REFERRING DIAG:  Diagnosis  L03.90 (ICD-10-CM) - Cellulitis, unspecified    THERAPY DIAG:  Pain in right lower leg  Non-healing wound of lower extremity, right, sequela  Rationale for Evaluation and Treatment: Rehabilitation     Wound Therapy - 02/24/24 0001     Subjective PT daughter states that she hit her right leg on something in the bathroom about 3 weeks ago.  They are unsure what happened all they know is that her mother came out of the bathroom with her leg all bloody.    Patient and Family Stated Goals wound to heal    Date of Onset 02/03/24    Prior Treatments MD, antibiotic, self care    Pain Scale 0-10  Pain Score 5    increases to a 8 with debridement   Pain Type Acute pain    Pain Location Leg    Pain Orientation Right;Lower;Lateral    Pain Descriptors / Indicators Sharp    Pain Onset On-going    Patients Stated Pain Goal 0    Pain Intervention(s) Distraction    Evaluation and Treatment Procedures Explained to Patient/Family Yes    Evaluation and Treatment Procedures agreed to    Wound Properties Date First Assessed: 02/24/24 Time First Assessed: 1510 Present on Original Admission: Yes Primary Wound Type: Traumatic Secondary Wound Type - Traumatic: Laceration Location: Leg Location Orientation: Right;Lateral;Lower   Wound Image Images linked: 1     Site / Wound Assessment Dusky;Yellow (was 100% slough prior to debridement following debridement 40% slough, 60% granulation.)   Peri-wound Assessment Intact    Wound Length (cm) 1.5 cm    Wound Width (cm) 1.2 cm    Wound Surface Area (cm^2) 1.41 cm^2    Drainage Description Serosanguineous    Drainage Amount Small    Treatments Cleansed;Moisturizing cream;Site care   debridement   Dressing Type Non adherent    Dressing Changed Changed    Dressing Status New drainage    Wound Therapy - Clinical Statement see below    Wound Therapy - Functional Problem List see below    Factors Delaying/Impairing Wound Healing Altered sensation;Infection - systemic/local;Immobility;Multiple medical problems;Polypharmacy;Vascular compromise    Hydrotherapy Plan Debridement;Dressing change;Patient/family education    Wound Therapy - Frequency 2X / week    Wound Therapy - Current Recommendations PT    Wound Plan debride and dressing change as needed.    Dressing  medihoney, 2x2, 3kling and netting            PATIENT EDUCATION: Education details: Keep dressing on for 2-3 days; cleanse and moisturize LE, use sprayer in shower to attempt to loosen slough in wound.  Person educated: Child(ren) Education method: Explanation Education comprehension: verbalized understanding   HOME EXERCISE PROGRAM: N/A   GOALS: Goals reviewed with patient? Yes  SHORT TERM GOALS: Target date: 05/10/23  Pain to be no greater than a 3/10 in Rt lower leg Baseline: Goal status: INITIAL  2.  Wound to be 100% granulated Baseline:  Goal status: INITIAL  LONG TERM GOALS: Target date: 03/23/24  PT to have no pain in her Rt Lower leg Baseline:  Goal status: INITIAL  2.  Pt wound to be healed.  Baseline:  Goal status: INITIAL    ASSESSMENT:  CLINICAL IMPRESSION: Patient is a 89 y.o. patient  who was seen today for physical therapy evaluation and treatment for a painful non healing right leg wound. The  patient has been on antibiotics and has been seen by the MD without resolution of the wound.  At this time the wound is covered with slough and painful.  Laura Martin will benefit from skilled PT in order to keep a healing environment to decrease the risk of cellulitis as well as to decrease her pain.  Therapist instructed pt daughter in self care and let her know that if her mother's wound is looking much better she can call and cancel her appoingments.    OBJECTIVE IMPAIRMENTS: pain and decreased skin integrity.   ACTIVITY LIMITATIONS: bathing, dressing, and hygiene/grooming  PERSONAL FACTORS: Age, Fitness, and Time since onset of injury/illness/exacerbation are also affecting patient's functional outcome.   REHAB POTENTIAL: Good  CLINICAL DECISION MAKING: Stable/uncomplicated  EVALUATION COMPLEXITY: Moderate  PLAN: PT FREQUENCY:  2x/week  PT DURATION: 4 weeks  PLANNED INTERVENTIONS: 97535- Self Care, 02402- Wound care (first 20 sq cm), and Patient/Family education  PLAN FOR NEXT SESSION: debride and dressing change as appropriate.    Montie Metro, PT CLT [phone]  02/24/2024, 3:36 PM

## 2024-02-25 ENCOUNTER — Encounter (HOSPITAL_COMMUNITY): Payer: Self-pay | Admitting: Physical Therapy

## 2024-02-25 NOTE — Therapy (Signed)
 [facility] Outpatient Rehabilitation at [facility] [address] [place], [place], [address] Phone: [phone]   Fax:  [phone]  Patient Details  Name: Laura Martin MRN: [id number] Date of Birth: [birth year] Referring Provider:  No ref. provider found  Encounter Date: 02/25/2024 PHYSICAL THERAPY DISCHARGE SUMMARY  Visits from Start of Care: 1  Current functional level related to goals / functional outcomes: Therapist debrided wound leaving wound 80% granulated   Remaining deficits: slough   Education / Equipment: Educated pt daughter how to take care of the wound .  Daughter called back today stating that it looked so much better she wanted to have her mother discharged.   Patient agrees to discharge. Patient goals were partially met. Patient is being discharged due to being pleased with the current functional level.  Montie Metro, PT CLT [phone]  02/25/2024, 2:14 PM   [facility] Outpatient Rehabilitation at [facility] [address] [place], [place], [address] Phone: [phone]   Fax:  [phone]

## 2024-02-29 ENCOUNTER — Ambulatory Visit (HOSPITAL_COMMUNITY): Admitting: Physical Therapy

## 2024-03-03 ENCOUNTER — Ambulatory Visit (HOSPITAL_COMMUNITY): Admitting: Physical Therapy

## 2024-03-09 ENCOUNTER — Ambulatory Visit (HOSPITAL_COMMUNITY): Admitting: Physical Therapy

## 2024-03-11 ENCOUNTER — Ambulatory Visit (HOSPITAL_COMMUNITY): Admitting: Physical Therapy

## 2024-03-14 ENCOUNTER — Ambulatory Visit (HOSPITAL_COMMUNITY): Admitting: Physical Therapy

## 2024-03-22 ENCOUNTER — Ambulatory Visit (HOSPITAL_COMMUNITY): Admitting: Physical Therapy

## 2024-03-25 ENCOUNTER — Ambulatory Visit (HOSPITAL_COMMUNITY): Admitting: Physical Therapy

## 2024-04-19 ENCOUNTER — Ambulatory Visit

## 2024-04-19 DIAGNOSIS — I442 Atrioventricular block, complete: Secondary | ICD-10-CM

## 2024-04-20 LAB — CUP PACEART REMOTE DEVICE CHECK
Battery Impedance: 1960 Ohm
Battery Remaining Longevity: 34 mo
Battery Voltage: 2.76 V
Brady Statistic AP VP Percent: 32 %
Brady Statistic AP VS Percent: 0 %
Brady Statistic AS VP Percent: 68 %
Brady Statistic AS VS Percent: 0 %
Date Time Interrogation Session: 20260127161528
Implantable Lead Connection Status: 753985
Implantable Lead Connection Status: 753985
Implantable Lead Implant Date: 20151009
Implantable Lead Implant Date: 20151009
Implantable Lead Location: 753859
Implantable Lead Location: 753860
Implantable Lead Model: 5076
Implantable Lead Model: 5076
Implantable Pulse Generator Implant Date: 20151009
Lead Channel Impedance Value: 446 Ohm
Lead Channel Impedance Value: 625 Ohm
Lead Channel Pacing Threshold Amplitude: 0.5 V
Lead Channel Pacing Threshold Amplitude: 0.875 V
Lead Channel Pacing Threshold Pulse Width: 0.4 ms
Lead Channel Pacing Threshold Pulse Width: 0.4 ms
Lead Channel Setting Pacing Amplitude: 2 V
Lead Channel Setting Pacing Amplitude: 2.5 V
Lead Channel Setting Pacing Pulse Width: 0.4 ms
Lead Channel Setting Sensing Sensitivity: 2.8 mV
Zone Setting Status: 755011
Zone Setting Status: 755011

## 2024-04-24 ENCOUNTER — Ambulatory Visit: Payer: Self-pay | Admitting: Student in an Organized Health Care Education/Training Program

## 2024-04-27 NOTE — Progress Notes (Signed)
 Remote PPM Transmission

## 2024-05-06 ENCOUNTER — Ambulatory Visit: Admitting: Student in an Organized Health Care Education/Training Program

## 2024-07-19 ENCOUNTER — Ambulatory Visit

## 2024-10-18 ENCOUNTER — Ambulatory Visit

## 2025-01-17 ENCOUNTER — Ambulatory Visit

## 2025-04-18 ENCOUNTER — Ambulatory Visit
